# Patient Record
Sex: Male | Born: 1960 | Race: White | Hispanic: No | Marital: Married | Smoking: Former smoker
Health system: Southern US, Community
[De-identification: ages and names within clinical notes are randomized; demographics above are authoritative.]

## PROBLEM LIST (undated history)

## (undated) DIAGNOSIS — Z9289 Personal history of other medical treatment: Secondary | ICD-10-CM

## (undated) DIAGNOSIS — K219 Gastro-esophageal reflux disease without esophagitis: Secondary | ICD-10-CM

## (undated) DIAGNOSIS — R112 Nausea with vomiting, unspecified: Secondary | ICD-10-CM

## (undated) DIAGNOSIS — I712 Thoracic aortic aneurysm, without rupture, unspecified: Secondary | ICD-10-CM

## (undated) DIAGNOSIS — Z860101 Personal history of adenomatous and serrated colon polyps: Secondary | ICD-10-CM

## (undated) DIAGNOSIS — T4145XA Adverse effect of unspecified anesthetic, initial encounter: Secondary | ICD-10-CM

## (undated) DIAGNOSIS — M170 Bilateral primary osteoarthritis of knee: Secondary | ICD-10-CM

## (undated) DIAGNOSIS — Z8679 Personal history of other diseases of the circulatory system: Secondary | ICD-10-CM

## (undated) DIAGNOSIS — Z86010 Personal history of colonic polyps: Secondary | ICD-10-CM

## (undated) DIAGNOSIS — I350 Nonrheumatic aortic (valve) stenosis: Secondary | ICD-10-CM

## (undated) DIAGNOSIS — R011 Cardiac murmur, unspecified: Secondary | ICD-10-CM

## (undated) DIAGNOSIS — I10 Essential (primary) hypertension: Secondary | ICD-10-CM

## (undated) DIAGNOSIS — J301 Allergic rhinitis due to pollen: Secondary | ICD-10-CM

## (undated) DIAGNOSIS — T8859XA Other complications of anesthesia, initial encounter: Secondary | ICD-10-CM

## (undated) DIAGNOSIS — E7800 Pure hypercholesterolemia, unspecified: Secondary | ICD-10-CM

## (undated) DIAGNOSIS — K269 Duodenal ulcer, unspecified as acute or chronic, without hemorrhage or perforation: Secondary | ICD-10-CM

## (undated) DIAGNOSIS — B029 Zoster without complications: Secondary | ICD-10-CM

## (undated) DIAGNOSIS — Z9889 Other specified postprocedural states: Secondary | ICD-10-CM

## (undated) DIAGNOSIS — R7301 Impaired fasting glucose: Secondary | ICD-10-CM

## (undated) HISTORY — DX: Personal history of adenomatous and serrated colon polyps: Z86.0101

## (undated) HISTORY — DX: Bilateral primary osteoarthritis of knee: M17.0

## (undated) HISTORY — PX: WRIST SURGERY: SHX841

## (undated) HISTORY — DX: Pure hypercholesterolemia, unspecified: E78.00

## (undated) HISTORY — DX: Zoster without complications: B02.9

## (undated) HISTORY — PX: HEEL SPUR SURGERY: SHX665

## (undated) HISTORY — DX: Impaired fasting glucose: R73.01

## (undated) HISTORY — DX: Nonrheumatic aortic (valve) stenosis: I35.0

## (undated) HISTORY — PX: COLONOSCOPY: SHX174

## (undated) HISTORY — DX: Essential (primary) hypertension: I10

## (undated) HISTORY — PX: KNEE ARTHROSCOPY: SUR90

## (undated) HISTORY — DX: Personal history of colonic polyps: Z86.010

## (undated) HISTORY — DX: Thoracic aortic aneurysm, without rupture, unspecified: I71.20

## (undated) HISTORY — DX: Allergic rhinitis due to pollen: J30.1

## (undated) HISTORY — DX: Personal history of other diseases of the circulatory system: Z86.79

---

## 1981-07-05 DIAGNOSIS — K269 Duodenal ulcer, unspecified as acute or chronic, without hemorrhage or perforation: Secondary | ICD-10-CM

## 1981-07-05 HISTORY — DX: Duodenal ulcer, unspecified as acute or chronic, without hemorrhage or perforation: K26.9

## 2000-09-07 ENCOUNTER — Encounter: Admission: RE | Admit: 2000-09-07 | Discharge: 2000-09-07 | Payer: Self-pay | Admitting: Internal Medicine

## 2000-12-23 ENCOUNTER — Ambulatory Visit (HOSPITAL_BASED_OUTPATIENT_CLINIC_OR_DEPARTMENT_OTHER): Admission: RE | Admit: 2000-12-23 | Discharge: 2000-12-23 | Payer: Self-pay | Admitting: Orthopedic Surgery

## 2000-12-23 ENCOUNTER — Encounter (INDEPENDENT_AMBULATORY_CARE_PROVIDER_SITE_OTHER): Payer: Self-pay | Admitting: *Deleted

## 2004-07-05 HISTORY — PX: LASIK: SHX215

## 2004-07-05 HISTORY — PX: GANGLION CYST EXCISION: SHX1691

## 2004-07-28 ENCOUNTER — Ambulatory Visit: Payer: Self-pay | Admitting: Family Medicine

## 2007-08-07 ENCOUNTER — Encounter: Admission: RE | Admit: 2007-08-07 | Discharge: 2007-08-07 | Payer: Self-pay | Admitting: Neurosurgery
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
## 2011-07-06 HISTORY — PX: HEEL SPUR SURGERY: SHX665

## 2014-01-10 ENCOUNTER — Telehealth: Payer: Self-pay | Admitting: *Deleted

## 2014-01-10 NOTE — Telephone Encounter (Signed)
[facility] [place] per [employer] Webster

## 2014-04-19 ENCOUNTER — Other Ambulatory Visit (INDEPENDENT_AMBULATORY_CARE_PROVIDER_SITE_OTHER): Payer: Self-pay | Admitting: Surgery

## 2014-04-23 ENCOUNTER — Encounter (HOSPITAL_COMMUNITY): Payer: Self-pay | Admitting: Pharmacy Technician

## 2014-04-23 NOTE — Pre-Procedure Instructions (Signed)
Chase Lowe  04/23/2014   Your procedure is scheduled on:  Friday, Chase BlackbirdOctober 23  [facility] at 0530 AM.  Call this number if you have problems the morning of surgery: [phone]   Remember:   Do not eat food or drink liquids after midnight.Thursday night   Take these medicines the morning of surgery with A SIP OF WATER: none   Do not wear jewelry.  Do not wear lotions, powders, or perfumes. .  Do not shave 48 hours prior to surgery. Men may shave face and neck.  Do not bring valuables to the hospital.  [facility] is not responsible  for any belongings or valuables.               Contacts, dentures or bridgework may not be worn into surgery.  Leave suitcase in the car. After surgery it may be brought to your room.  For patients admitted to the hospital, discharge time is determined by your.treatment team.               Patients discharged the day of surgery will not be allowed to drive  home.  Name and phone number of your driver:      Special Instructions: [place] - Preparing for Surgery  Before surgery, you can play an important role.  Because skin is not sterile, your skin needs to be as free of germs as possible.  You can reduce the number of germs on you skin by washing with CHG (chlorahexidine gluconate) soap before surgery.  CHG is an antiseptic cleaner which kills germs and bonds with the skin to continue killing germs even after washing.  Please DO NOT use if you have an allergy to CHG or antibacterial soaps.  If your skin becomes reddened/irritated stop using the CHG and inform your nurse when you arrive at Short Stay.  Do not shave (including legs and underarms) for at least 48 hours prior to the first CHG shower.  You may shave your face.  Please follow these instructions carefully:   1.  Shower with CHG Soap the night before surgery and the  morning of Surgery.  2.  If you choose to wash your hair, wash your hair first as  usual with your normal shampoo.  3.  After you shampoo, rinse your hair and body thoroughly to remove the  Shampoo.  4.  Use CHG as you would any other liquid soap.  You can apply chg directly   to the skin and wash gently with scrungie or a clean washcloth.  5.  Apply the CHG Soap to your body ONLY FROM THE NECK DOWN.  Do not use on open wounds or open sores.  Avoid contact with your eyes,   ears, mouth and genitals (private parts).  Wash genitals (private parts)  with your normal soap.  6.  Wash thoroughly, paying special attention to the area where your surgery will be performed.  7.  Thoroughly rinse your body with warm water from the neck down.  8.  DO NOT shower/wash with your normal soap after using and rinsing off   the CHG Soap.  9.  Pat yourself dry with a clean towel.            10.  Wear clean pajamas.            11.  Place clean sheets on your bed the night of your first shower and do not   sleep  with pets.  Day of Surgery  Do not apply any lotions/deoderants the morning of surgery.  Please wear clean clothes to the hospital/surgery center.     Please read over the following fact sheets that you were given: Pain Booklet, Coughing and Deep Breathing and Surgical Site Infection Prevention

## 2014-04-24 ENCOUNTER — Encounter (HOSPITAL_COMMUNITY): Payer: Self-pay

## 2014-04-24 ENCOUNTER — Encounter (HOSPITAL_COMMUNITY)
Admission: RE | Admit: 2014-04-24 | Discharge: 2014-04-24 | Disposition: A | Discharge status: Home / Self Care | Payer: BC Managed Care – PPO | Source: Ambulatory Visit | Attending: Surgery | Admitting: Surgery

## 2014-04-24 DIAGNOSIS — K4090 Unilateral inguinal hernia, without obstruction or gangrene, not specified as recurrent: Secondary | ICD-10-CM | POA: Diagnosis not present

## 2014-04-24 DIAGNOSIS — Z87891 Personal history of nicotine dependence: Secondary | ICD-10-CM | POA: Diagnosis not present

## 2014-04-24 HISTORY — DX: Other complications of anesthesia, initial encounter: T88.59XA

## 2014-04-24 HISTORY — DX: Adverse effect of unspecified anesthetic, initial encounter: T41.45XA

## 2014-04-24 LAB — CBC
HCT: 40.7 % (ref 39.0–52.0)
HEMOGLOBIN: 14 g/dL (ref 13.0–17.0)
MCH: 31.7 pg (ref 26.0–34.0)
MCHC: 34.4 g/dL (ref 30.0–36.0)
MCV: 92.1 fL (ref 78.0–100.0)
Platelets: 153 10*3/uL (ref 150–400)
RBC: 4.42 MIL/uL (ref 4.22–5.81)
RDW: 12.3 % (ref 11.5–15.5)
WBC: 3.2 10*3/uL — AB (ref 4.0–10.5)

## 2014-04-24 NOTE — Progress Notes (Signed)
PCP: Dr. Lenise [facility]eyers at [facility] [place]  Pt. Stated he had ekg in 2013 because he felt a flutter in his chest. Went to Dr. Lenise ArenaMeyers' and had ekg. Stated everything was normal.No other test were done. No problems since then. Pt. Stated he had a physical last week and Dr. Lenise ArenaMeyers is aware of pt's surgery.

## 2014-04-25 MED ORDER — CEFAZOLIN SODIUM-DEXTROSE 2-3 GM-% IV SOLR
2.0000 g | INTRAVENOUS | Status: DC
  Filled 2014-04-25: qty 50

## 2014-04-25 NOTE — H&P (Signed)
Chase Lowe 04/19/2014 9:54 AM Location: [facility] Patient #: [id number] DOB: 1960/09/10 Married / Language: Undefined / Race: Undefined Male  History of Present Illness (Lakota Schweppe A. Magnus IvanBlackman MD; 04/19/2014 10:10 AM) Patient words: inguinal hernia.  The patient is a 53 year old male who presents with an inguinal hernia. This gentleman is well-known to me. He is a self referral with a symptomatic left inguinal hernia. he has had a hernia several months. He reports that it easily reduces but is slightly getting larger and more uncomfortable. He has no obstructive symptoms   Other Problems Ginnie Smart(Michelle R Brooks, CMA; 04/19/2014 9:54 AM) Gastric Ulcer Inguinal Hernia  Past Surgical History Ginnie Smart(Michelle R Brooks, CMA; 04/19/2014 9:54 AM) Foot Surgery Right. Vasectomy  Diagnostic Studies History Ginnie Smart(Michelle R [place], [place]; 04/19/2014 9:54 AM) Colonoscopy >10 years ago  Allergies Ginnie Smart(Michelle R Brooks, [place]; 04/19/2014 9:57 AM) Codeine Phosphate *ANALGESICS - OPIOID*  Medication History Ginnie Smart(Michelle R Brooks, CMA; 04/19/2014 9:56 AM) Multiple Vitamin (1 (one) Oral) Active. Aspirin (81MG  Tablet, 1 (one) Oral) Active.  Social History Ginnie Smart(Michelle R Brooks, [place]; 04/19/2014 9:54 AM) Alcohol use Occasional alcohol use. Caffeine use Carbonated beverages, Coffee. No drug use Tobacco use Former smoker.  Family History Ginnie Smart(Michelle R Brooks, [place]; 04/19/2014 9:54 AM) First Degree Relatives No pertinent family history  Review of Systems Marcelino Duster(Michelle R. Brooks CMA; 04/19/2014 9:54 AM) General Not Present- Appetite Loss, Chills, Fatigue, Fever, Night Sweats, Weight Gain and Weight Loss. Skin Not Present- Change in Wart/Mole, Dryness, Hives, Jaundice, New Lesions, Non-Healing Wounds, Rash and Ulcer. HEENT Not Present- Earache, Hearing Loss, Hoarseness, Nose Bleed, Oral Ulcers, Ringing in the Ears, Seasonal Allergies, Sinus Pain, Sore Throat, Visual Disturbances, Wears glasses/contact  lenses and Yellow Eyes. Respiratory Not Present- Bloody sputum, Chronic Cough, Difficulty Breathing, Snoring and Wheezing. Breast Not Present- Breast Mass, Breast Pain, Nipple Discharge and Skin Changes. Cardiovascular Not Present- Chest Pain, Difficulty Breathing Lying Down, Leg Cramps, Palpitations, Rapid Heart Rate, Shortness of Breath and Swelling of Extremities. Gastrointestinal Not Present- Abdominal Pain, Bloating, Bloody Stool, Change in Bowel Habits, Chronic diarrhea, Constipation, Difficulty Swallowing, Excessive gas, Gets full quickly at meals, Hemorrhoids, Indigestion, Nausea, Rectal Pain and Vomiting. Male Genitourinary Not Present- Blood in Urine, Change in Urinary Stream, Frequency, Impotence, Nocturia, Painful Urination, Urgency and Urine Leakage. Musculoskeletal Not Present- Back Pain, Joint Pain, Joint Stiffness, Muscle Pain, Muscle Weakness and Swelling of Extremities. Neurological Not Present- Decreased Memory, Fainting, Headaches, Numbness, Seizures, Tingling, Tremor, Trouble walking and Weakness. Psychiatric Not Present- Anxiety, Bipolar, Change in Sleep Pattern, Depression, Fearful and Frequent crying. Endocrine Not Present- Cold Intolerance, Excessive Hunger, Hair Changes, Heat Intolerance, Hot flashes and New Diabetes. Hematology Not Present- Easy Bruising, Excessive bleeding, Gland problems, HIV and Persistent Infections.   Vitals [employer](Michelle R. Brooks CMA; 04/19/2014 9:55 AM) 04/19/2014 9:54 AM Weight: 184.38 lb Height: 70in Body Surface Area: 2.03 m Body Mass Index: 26.45 kg/m Pulse: 72 (Regular)  Resp.: 14 (Unlabored)  BP: 118/80 (Sitting, Left Arm, Standard)    Physical Exam (Ayvin Lipinski A. Magnus IvanBlackman MD; 04/19/2014 10:10 AM) The physical exam findings are as follows: Note:He is well-appearing lungs are clear to auscultation bilaterally cardiovascular is regular rate and rhythm there is an easily reducible left inguinal hernia. there is no evidence of  right inguinal hernia or umbilical hernia    Assessment & Plan (Linus Weckerly A. Magnus IvanBlackman MD; 04/19/2014 10:11 AM) LEFT INGUINAL HERNIA (550.90  K40.90) Impression: I discussed the diagnosis with him in detail. I discussed surgical repair with mesh. I discussed both laparoscopic  and open techniques. He wished to proceed with a left open inguinal hernia repair with mesh. I discussed risks of surgery in detail. He understands and wished to proceed

## 2014-04-26 ENCOUNTER — Encounter (HOSPITAL_COMMUNITY): Payer: BC Managed Care – PPO | Admitting: Anesthesiology

## 2014-04-26 ENCOUNTER — Encounter (HOSPITAL_COMMUNITY)
Admission: RE | Disposition: A | Discharge status: Home / Self Care | Payer: Self-pay | Source: Ambulatory Visit | Attending: Surgery

## 2014-04-26 ENCOUNTER — Ambulatory Visit (HOSPITAL_COMMUNITY)
Admission: RE | Admit: 2014-04-26 | Discharge: 2014-04-26 | Disposition: A | Discharge status: Home / Self Care | Payer: BC Managed Care – PPO | Source: Ambulatory Visit | Attending: Surgery | Admitting: Surgery

## 2014-04-26 ENCOUNTER — Encounter (HOSPITAL_COMMUNITY): Payer: Self-pay | Admitting: *Deleted

## 2014-04-26 ENCOUNTER — Ambulatory Visit (HOSPITAL_COMMUNITY): Payer: BC Managed Care – PPO | Admitting: Anesthesiology

## 2014-04-26 DIAGNOSIS — K4090 Unilateral inguinal hernia, without obstruction or gangrene, not specified as recurrent: Secondary | ICD-10-CM | POA: Diagnosis not present

## 2014-04-26 DIAGNOSIS — Z87891 Personal history of nicotine dependence: Secondary | ICD-10-CM | POA: Insufficient documentation

## 2014-04-26 HISTORY — PX: INGUINAL HERNIA REPAIR: SHX194

## 2014-04-26 HISTORY — PX: INSERTION OF MESH: SHX5868

## 2014-04-26 SURGERY — REPAIR, HERNIA, INGUINAL, ADULT
Anesthesia: Regional | Site: Groin | Laterality: Left

## 2014-04-26 MED ORDER — MIDAZOLAM HCL 2 MG/2ML IJ SOLN
INTRAMUSCULAR | Status: AC
  Filled 2014-04-26: qty 2

## 2014-04-26 MED ORDER — MIDAZOLAM HCL 5 MG/5ML IJ SOLN
INTRAMUSCULAR | Status: DC | PRN
  Administered 2014-04-26: 2 mg via INTRAVENOUS

## 2014-04-26 MED ORDER — OXYCODONE HCL 5 MG PO TABS: 5.0000 mg | ORAL_TABLET | Freq: Once | ORAL | Status: DC | PRN

## 2014-04-26 MED ORDER — KETOROLAC TROMETHAMINE 30 MG/ML IJ SOLN
INTRAMUSCULAR | Status: DC | PRN
  Administered 2014-04-26: 30 mg via INTRAVENOUS

## 2014-04-26 MED ORDER — PROPOFOL 10 MG/ML IV BOLUS
INTRAVENOUS | Status: DC | PRN
  Administered 2014-04-26: 200 mg via INTRAVENOUS

## 2014-04-26 MED ORDER — LIDOCAINE HCL (PF) 1 % IJ SOLN
INTRAMUSCULAR | Status: AC
Start: 2014-04-26 — End: 2014-04-26
  Filled 2014-04-26: qty 30

## 2014-04-26 MED ORDER — MORPHINE SULFATE 2 MG/ML IJ SOLN: 1.0000 mg | INTRAMUSCULAR | Status: DC | PRN

## 2014-04-26 MED ORDER — OXYCODONE-ACETAMINOPHEN 5-325 MG PO TABS
1.0000 | ORAL_TABLET | Freq: Once | ORAL | Status: AC
  Administered 2014-04-26: 1 via ORAL

## 2014-04-26 MED ORDER — HYDROMORPHONE HCL 1 MG/ML IJ SOLN
INTRAMUSCULAR | Status: AC
  Filled 2014-04-26: qty 1

## 2014-04-26 MED ORDER — ONDANSETRON HCL 4 MG/2ML IJ SOLN
INTRAMUSCULAR | Status: AC
  Filled 2014-04-26: qty 2

## 2014-04-26 MED ORDER — OXYCODONE-ACETAMINOPHEN 5-325 MG PO TABS: 1.0000 | ORAL_TABLET | ORAL | Status: DC | PRN

## 2014-04-26 MED ORDER — LACTATED RINGERS IV SOLN
INTRAVENOUS | Status: DC | PRN
  Administered 2014-04-26: 07:00:00 via INTRAVENOUS

## 2014-04-26 MED ORDER — OXYCODONE HCL 5 MG/5ML PO SOLN: 5.0000 mg | Freq: Once | ORAL | Status: DC | PRN

## 2014-04-26 MED ORDER — LIDOCAINE HCL (PF) 1 % IJ SOLN
INTRAMUSCULAR | Status: DC | PRN
  Administered 2014-04-26: 5 mL

## 2014-04-26 MED ORDER — KETOROLAC TROMETHAMINE 30 MG/ML IJ SOLN
INTRAMUSCULAR | Status: AC
  Filled 2014-04-26: qty 1

## 2014-04-26 MED ORDER — NEOSTIGMINE METHYLSULFATE 10 MG/10ML IV SOLN
INTRAVENOUS | Status: AC
  Filled 2014-04-26: qty 1

## 2014-04-26 MED ORDER — BUPIVACAINE-EPINEPHRINE (PF) 0.5% -1:200000 IJ SOLN
INTRAMUSCULAR | Status: AC
  Filled 2014-04-26: qty 30

## 2014-04-26 MED ORDER — FENTANYL CITRATE 0.05 MG/ML IJ SOLN
INTRAMUSCULAR | Status: DC | PRN
  Administered 2014-04-26: 100 ug via INTRAVENOUS
  Administered 2014-04-26: 50 ug via INTRAVENOUS

## 2014-04-26 MED ORDER — BUPIVACAINE-EPINEPHRINE (PF) 0.5% -1:200000 IJ SOLN
INTRAMUSCULAR | Status: DC | PRN
  Administered 2014-04-26: 30 mL via PERINEURAL

## 2014-04-26 MED ORDER — OXYCODONE HCL 5 MG PO TABS: 5.0000 mg | ORAL_TABLET | ORAL | Status: DC | PRN

## 2014-04-26 MED ORDER — FENTANYL CITRATE 0.05 MG/ML IJ SOLN
INTRAMUSCULAR | Status: AC
  Filled 2014-04-26: qty 5

## 2014-04-26 MED ORDER — SUCCINYLCHOLINE CHLORIDE 20 MG/ML IJ SOLN
INTRAMUSCULAR | Status: AC
  Filled 2014-04-26: qty 1

## 2014-04-26 MED ORDER — PROPOFOL 10 MG/ML IV BOLUS
INTRAVENOUS | Status: AC
  Filled 2014-04-26: qty 20

## 2014-04-26 MED ORDER — SODIUM CHLORIDE 0.9 % IJ SOLN
INTRAMUSCULAR | Status: AC
  Filled 2014-04-26: qty 10

## 2014-04-26 MED ORDER — ONDANSETRON HCL 4 MG/2ML IJ SOLN: 4.0000 mg | Freq: Four times a day (QID) | INTRAMUSCULAR | Status: DC | PRN

## 2014-04-26 MED ORDER — ONDANSETRON HCL 4 MG/2ML IJ SOLN
INTRAMUSCULAR | Status: DC | PRN
  Administered 2014-04-26: 4 mg via INTRAVENOUS

## 2014-04-26 MED ORDER — LIDOCAINE HCL (CARDIAC) 20 MG/ML IV SOLN
INTRAVENOUS | Status: DC | PRN
  Administered 2014-04-26: 70 mg via INTRAVENOUS

## 2014-04-26 MED ORDER — LIDOCAINE HCL (CARDIAC) 20 MG/ML IV SOLN
INTRAVENOUS | Status: AC
  Filled 2014-04-26: qty 5

## 2014-04-26 MED ORDER — EPHEDRINE SULFATE 50 MG/ML IJ SOLN
INTRAMUSCULAR | Status: AC
  Filled 2014-04-26: qty 1

## 2014-04-26 MED ORDER — LIDOCAINE HCL (CARDIAC) 20 MG/ML IV SOLN
INTRAVENOUS | Status: AC
Start: 2014-04-26 — End: 2014-04-26
  Filled 2014-04-26: qty 5

## 2014-04-26 MED ORDER — ROCURONIUM BROMIDE 50 MG/5ML IV SOLN
INTRAVENOUS | Status: AC
  Filled 2014-04-26: qty 1

## 2014-04-26 MED ORDER — HYDROMORPHONE HCL 1 MG/ML IJ SOLN
0.2500 mg | INTRAMUSCULAR | Status: DC | PRN
  Administered 2014-04-26 (×2): 0.5 mg via INTRAVENOUS

## 2014-04-26 MED ORDER — OXYCODONE-ACETAMINOPHEN 5-325 MG PO TABS
ORAL_TABLET | ORAL | Status: AC
  Filled 2014-04-26: qty 1

## 2014-04-26 MED ORDER — GLYCOPYRROLATE 0.2 MG/ML IJ SOLN
INTRAMUSCULAR | Status: AC
  Filled 2014-04-26: qty 2

## 2014-04-26 MED ORDER — 0.9 % SODIUM CHLORIDE (POUR BTL) OPTIME
TOPICAL | Status: DC | PRN
  Administered 2014-04-26: 1000 mL

## 2014-04-26 SURGICAL SUPPLY — 53 items
APL SKNCLS STERI-STRIP NONHPOA (GAUZE/BANDAGES/DRESSINGS) ×1
BENZOIN TINCTURE PRP APPL 2/3 (GAUZE/BANDAGES/DRESSINGS) ×3 IMPLANT
BLADE SURG 10 STRL SS (BLADE) ×3 IMPLANT
BLADE SURG 15 STRL LF DISP TIS (BLADE) ×1 IMPLANT
BLADE SURG 15 STRL SS (BLADE) ×3
BLADE SURG ROTATE 9660 (MISCELLANEOUS) IMPLANT
CHLORAPREP W/TINT 26ML (MISCELLANEOUS) ×3 IMPLANT
CLOSURE WOUND 1/2 X4 (GAUZE/BANDAGES/DRESSINGS) ×1
COVER SURGICAL LIGHT HANDLE (MISCELLANEOUS) ×3 IMPLANT
DRAIN PENROSE 1/2X12 LTX STRL (WOUND CARE) ×2 IMPLANT
DRAPE LAPAROTOMY TRNSV 102X78 (DRAPE) ×3 IMPLANT
DRAPE UTILITY 15X26 W/TAPE STR (DRAPE) ×6 IMPLANT
DRSG TEGADERM 4X4.75 (GAUZE/BANDAGES/DRESSINGS) ×3 IMPLANT
DRSG TELFA 3X8 NADH (GAUZE/BANDAGES/DRESSINGS) ×3 IMPLANT
ELECT CAUTERY BLADE 6.4 (BLADE) ×3 IMPLANT
ELECT REM PT RETURN 9FT ADLT (ELECTROSURGICAL) ×3
ELECTRODE REM PT RTRN 9FT ADLT (ELECTROSURGICAL) ×1 IMPLANT
GLOVE BIO SURGEON STRL SZ 6.5 (GLOVE) ×1 IMPLANT
GLOVE BIO SURGEON STRL SZ7 (GLOVE) ×2 IMPLANT
GLOVE BIO SURGEONS STRL SZ 6.5 (GLOVE) ×1
GLOVE BIOGEL PI IND STRL 7.0 (GLOVE) IMPLANT
GLOVE BIOGEL PI INDICATOR 7.0 (GLOVE) ×6
GLOVE SURG SIGNA 7.5 PF LTX (GLOVE) ×3 IMPLANT
GOWN STRL REUS W/ TWL LRG LVL3 (GOWN DISPOSABLE) ×1 IMPLANT
GOWN STRL REUS W/ TWL XL LVL3 (GOWN DISPOSABLE) ×1 IMPLANT
GOWN STRL REUS W/TWL LRG LVL3 (GOWN DISPOSABLE) ×6
GOWN STRL REUS W/TWL XL LVL3 (GOWN DISPOSABLE) ×3
KIT BASIN OR (CUSTOM PROCEDURE TRAY) ×3 IMPLANT
KIT ROOM TURNOVER OR (KITS) ×3 IMPLANT
MESH PARIETEX PROGRIP LEFT (Mesh General) ×2 IMPLANT
NDL HYPO 25GX1X1/2 BEV (NEEDLE) ×1 IMPLANT
NEEDLE HYPO 25GX1X1/2 BEV (NEEDLE) ×3 IMPLANT
NS IRRIG 1000ML POUR BTL (IV SOLUTION) ×3 IMPLANT
PACK SURGICAL SETUP 50X90 (CUSTOM PROCEDURE TRAY) ×3 IMPLANT
PAD ARMBOARD 7.5X6 YLW CONV (MISCELLANEOUS) ×3 IMPLANT
PAD DRESSING TELFA 3X8 NADH (GAUZE/BANDAGES/DRESSINGS) IMPLANT
PENCIL BUTTON HOLSTER BLD 10FT (ELECTRODE) ×3 IMPLANT
SPECIMEN JAR SMALL (MISCELLANEOUS) IMPLANT
SPONGE LAP 18X18 X RAY DECT (DISPOSABLE) ×3 IMPLANT
STRIP CLOSURE SKIN 1/2X4 (GAUZE/BANDAGES/DRESSINGS) ×2 IMPLANT
SUT MON AB 4-0 PC3 18 (SUTURE) ×3 IMPLANT
SUT SILK 2 0 SH (SUTURE) ×4 IMPLANT
SUT SILK 3 0 SH 30 (SUTURE) ×2 IMPLANT
SUT VIC AB 2-0 CT1 27 (SUTURE) ×3
SUT VIC AB 2-0 CT1 TAPERPNT 27 (SUTURE) ×1 IMPLANT
SUT VIC AB 3-0 CT1 27 (SUTURE) ×3
SUT VIC AB 3-0 CT1 TAPERPNT 27 (SUTURE) ×1 IMPLANT
SYR CONTROL 10ML LL (SYRINGE) ×3 IMPLANT
TOWEL OR 17X24 6PK STRL BLUE (TOWEL DISPOSABLE) ×1 IMPLANT
TOWEL OR 17X26 10 PK STRL BLUE (TOWEL DISPOSABLE) ×3 IMPLANT
TUBE CONNECTING 12'X1/4 (SUCTIONS) ×1
TUBE CONNECTING 12X1/4 (SUCTIONS) ×1 IMPLANT
YANKAUER SUCT BULB TIP NO VENT (SUCTIONS) ×2 IMPLANT

## 2014-04-26 NOTE — Anesthesia Procedure Notes (Addendum)
Anesthesia Regional Block:  TAP block  Pre-Anesthetic Checklist: ,, timeout performed, Correct Patient, Correct Site, Correct Laterality, Correct Procedure, Correct Position, site marked, Risks and benefits discussed,  Surgical consent,  Pre-op evaluation,  At surgeon's request and post-op pain management  Laterality: Left  Prep: chloraprep       Needles:   Needle Type: Echogenic Needle     Needle Length: 9cm 9 cm Needle Gauge: 21 and 21 G    Additional Needles:  Procedures: ultrasound guided (picture in chart) TAP block Narrative:  Start time: 04/26/2014 7:02 AM End time: 04/26/2014 7:14 AM Injection made incrementally with aspirations every 5 mL.  Performed by: Personally  Anesthesiologist: Dr Chaney MallingHodierne  Additional Notes: Pt tolerated the procedure well.   Procedure Name: LMA Insertion Date/Time: 04/26/2014 7:35 AM Performed by: Coralee RudFLORES, Jemery Stacey Pre-anesthesia Checklist: Patient identified, Emergency Drugs available, Suction available, Patient being monitored and Timeout performed Patient Re-evaluated:Patient Re-evaluated prior to inductionOxygen Delivery Method: Circle system utilized Preoxygenation: Pre-oxygenation with 100% oxygen Intubation Type: IV induction Ventilation: Mask ventilation without difficulty LMA: LMA inserted LMA Size: 5.0 Number of attempts: 1 Placement Confirmation: positive ETCO2 and breath sounds checked- equal and bilateral Tube secured with: Tape Dental Injury: Teeth and Oropharynx as per pre-operative assessment

## 2014-04-26 NOTE — Op Note (Signed)
NAMTito Dine:  Lowe, Chase Lowe              ACCOUNT NO.:  [id number]  MEDICAL RECORD NO.:  [id number]  LOCATION:  [facility]                         FACILITY:  [facility]  PHYSICIAN:  Abigail Miyamotoouglas Shanta Hartner, M.D. DATE OF BIRTH:  Feb 14, 1961  DATE OF PROCEDURE:  04/26/2014 DATE OF DISCHARGE:                              OPERATIVE REPORT   PREOPERATIVE DIAGNOSIS:  Left inguinal hernia.  POSTOPERATIVE DIAGNOSIS:  Left inguinal hernia.  PROCEDURE:  Left inguinal hernia repair with mesh.  SURGEON:  Dr. Abigail Miyamotoouglas Arshi Duarte.  ANESTHESIA:  General and half block with 1% lidocaine as well.  ESTIMATED BLOOD LOSS:  Minimal.  PROCEDURE IN DETAIL:  The patient was brought to the operating room, identified as [employer] Lowe.  He was placed supine on the operating room table and general anesthesia was induced.  He had already received a TAP block by Anesthesiology.  His abdomen was prepped and draped in the usual sterile fashion.  I anesthetized the skin and left groin with 1% lidocaine.  I then made a longitudinal incision with a scalpel.  I took this down through Scarpa's fascia with the electrocautery.  The external oblique fascia was then identified and opened with the internal and external rings.  I got some bleeding in transversalis muscles which had to be controlled with a couple of silk sutures.  I then was able to control the testicular cord structures with a Penrose drain.  The patient had a moderate-sized lipoma of the testicular cord as well as an indirect hernia sac.  I excised lipoma from the cord structure and tied off the base of that with a 2-0 silk suture.  I dissected the indirect hernia sac out to its base.  I reduced the contents and tied off the base with a silk suture as well.  I then excised the sac completely. Next, a piece of Prolene Covidien ProGrip mesh was brought onto the field.  I placed it around the cord structures and secured along the pubic tubercle and over the internal ring  appropriately.  I then sutured in place the pubic tubercle with a single 2-0 Vicryl suture.  Excellent coverage of the inguinal floor and cord structure appeared to be achieved.  I then closed the external oblique fascia over the top of this with running 2-0 Vicryl suture.  I closed the Scarpa fascia with interrupted 3-0 Vicryl sutures and closed the skin with a running 4-0 Monocryl.  Steri-Strips, Telfa, and Tegaderm were then applied.  The patient tolerated the procedure well.  All the counts were correct at the end of procedure.  The patient was then extubated in the operating room and taken in stable condition to recovery room.    Abigail Miyamotoouglas Sharis Keeran, M.D.    DB/MEDQ  D:  04/26/2014  T:  04/26/2014  Job:  [id number]

## 2014-04-26 NOTE — Discharge Instructions (Signed)
[facility] _______[facility]  UMBILICAL OR INGUINAL HERNIA REPAIR: POST OP INSTRUCTIONS  Always review your discharge instruction sheet given to you by the facility where your surgery was performed. IF YOU HAVE DISABILITY OR FAMILY LEAVE FORMS, YOU MUST BRING THEM TO THE OFFICE FOR PROCESSING.   DO NOT GIVE THEM TO YOUR DOCTOR.  1. A  prescription for pain medication may be given to you upon discharge.  Take your pain medication as prescribed, if needed.  If narcotic pain medicine is not needed, then you may take acetaminophen (Tylenol) or ibuprofen (Advil) as needed. 2. Take your usually prescribed medications unless otherwise directed. 3. If you need a refill on your pain medication, please contact your pharmacy.  They will contact our office to request authorization. Prescriptions will not be filled after 5 pm or on week-ends. 4. You should follow a light diet the first 24 hours after arrival home, such as soup and crackers, etc.  Be sure to include lots of fluids daily.  Resume your normal diet the day after surgery. 5. Most patients will experience some swelling and bruising around the umbilicus or in the groin and scrotum.  Ice packs and reclining will help.  Swelling and bruising can take several days to resolve.  6. It is common to experience some constipation if taking pain medication after surgery.  Increasing fluid intake and taking a stool softener (such as Colace) will usually help or prevent this problem from occurring.  A mild laxative (Milk of Magnesia or Miralax) should be taken according to package directions if there are no bowel movements after 48 hours. 7. Unless discharge instructions indicate otherwise, you may remove your bandages 24-48 hours after surgery, and you may shower at that time.  You may have steri-strips (small skin tapes) in place directly over the incision.  These strips should be left on the skin for 7-10 days.  If your surgeon used skin glue on the  incision, you may shower in 24 hours.  The glue will flake off over the next 2-3 weeks.  Any sutures or staples will be removed at the office during your follow-up visit. 8. ACTIVITIES:  You may resume regular (light) daily activities beginning the next day--such as daily self-care, walking, climbing stairs--gradually increasing activities as tolerated.  You may have sexual intercourse when it is comfortable.  Refrain from any heavy lifting or straining until approved by your doctor. a. You may drive when you are no longer taking prescription pain medication, you can comfortably wear a seatbelt, and you can safely maneuver your car and apply brakes. b. RETURN TO WORK:  __________________________________________________________ 9. You should see your doctor in the office for a follow-up appointment approximately 2-3 weeks after your surgery.  Make sure that you call for this appointment within a day or two after you arrive home to insure a convenient appointment time. 10. OTHER INSTRUCTIONS: NO LIFTING MORE THAN 15 TO 20 POUNDS FOR 4 WEEKS 11. ICE PACK AS NEEDED __________________________________________________________________________________________________________________________________________________________________________________________  WHEN TO CALL YOUR DOCTOR: 1. Fever over 101.0 2. Inability to urinate 3. Nausea and/or vomiting 4. Extreme swelling or bruising 5. Continued bleeding from incision. 6. Increased pain, redness, or drainage from the incision  The clinic staff is available to answer your questions during regular business hours.  Please dont hesitate to call and ask to speak to one of the nurses for clinical concerns.  If you have a medical emergency, go to the nearest emergency room or call 911.  A surgeon from [facility] is always on call at the hospital   [address], [address], [place], [place]  [id number] ?  [address] [address], [place], [place]    [id number] [phone] ? [phone] ? FAX [phone] Web site: [email or web address]  What to eat:  For your first meals, you should eat lightly; only small meals initially.  If you do not have nausea, you may eat larger meals.  Avoid spicy, greasy and heavy food.    General Anesthesia, Adult, Care After  Refer to this sheet in the next few weeks. These instructions provide you with information on caring for yourself after your procedure. Your health care provider may also give you more specific instructions. Your treatment has been planned according to current medical practices, but problems sometimes occur. Call your health care provider if you have any problems or questions after your procedure.  WHAT TO EXPECT AFTER THE PROCEDURE  After the procedure, it is typical to experience:  Sleepiness.  Nausea and vomiting. HOME CARE INSTRUCTIONS  For the first 24 hours after general anesthesia:  Have a responsible person with you.  Do not drive a car. If you are alone, do not take public transportation.  Do not drink alcohol.  Do not take medicine that has not been prescribed by your health care provider.  Do not sign important papers or make important decisions.  You may resume a normal diet and activities as directed by your health care provider.  Change bandages (dressings) as directed.  If you have questions or problems that seem related to general anesthesia, call the hospital and ask for the anesthetist or anesthesiologist on call. SEEK MEDICAL CARE IF:  You have nausea and vomiting that continue the day after anesthesia.  You develop a rash. SEEK IMMEDIATE MEDICAL CARE IF:  You have difficulty breathing.  You have chest pain.  You have any allergic problems. Document Released: 09/27/2000 Document Revised: 02/21/2013 Document Reviewed: 01/04/2013  [facility] Patient Information 2014 [employer] [place].

## 2014-04-26 NOTE — Anesthesia Postprocedure Evaluation (Signed)
  Anesthesia Post-op Note  Patient: Chase Lowe  Procedure(s) Performed: Procedure(s): LEFT INGUINAL HERNIA REPAIR WITH MESH (Left) INSERTION OF MESH (Left)  Patient Location: PACU  Anesthesia Type:General  Level of Consciousness: awake  Airway and Oxygen Therapy: Patient Spontanous Breathing  Post-op Pain: mild  Post-op Assessment: Post-op Vital signs reviewed  Post-op Vital Signs: Reviewed  Last Vitals:  Filed Vitals:   04/26/14 0926  BP: 110/76  Pulse: 60  Temp:   Resp: 9    Complications: No apparent anesthesia complications

## 2014-04-26 NOTE — Anesthesia Preprocedure Evaluation (Signed)
Anesthesia Evaluation  Patient identified by MRN, date of birth, ID band Patient awake    Reviewed: Allergy & Precautions, H&P , NPO status , Patient's Chart, lab work & pertinent test results  Airway Mallampati: II  Neck ROM: full    Dental   Pulmonary neg pulmonary ROS,          Cardiovascular negative cardio ROS      Neuro/Psych    GI/Hepatic   Endo/Other    Renal/GU      Musculoskeletal   Abdominal   Peds  Hematology   Anesthesia Other Findings   Reproductive/Obstetrics                           Anesthesia Physical Anesthesia Plan  ASA: I  Anesthesia Plan: General and Regional   Post-op Pain Management: MAC Combined w/ Regional for Post-op pain   Induction: Intravenous  Airway Management Planned: LMA  Additional Equipment:   Intra-op Plan:   Post-operative Plan:   Informed Consent: I have reviewed the patients History and Physical, chart, labs and discussed the procedure including the risks, benefits and alternatives for the proposed anesthesia with the patient or authorized representative who has indicated his/her understanding and acceptance.     Plan Discussed with: CRNA, Anesthesiologist and Surgeon  Anesthesia Plan Comments:         Anesthesia Quick Evaluation  

## 2014-04-26 NOTE — Anesthesia Postprocedure Evaluation (Signed)
  Anesthesia Post-op Note  Patient: Chase Lowe  Procedure(s) Performed: Procedure(s): LEFT INGUINAL HERNIA REPAIR WITH MESH (Left) INSERTION OF MESH (Left)  Patient Location: PACU  Anesthesia Type:General  Level of Consciousness: awake  Airway and Oxygen Therapy: Patient Spontanous Breathing  Post-op Pain: mild  Post-op Assessment: Post-op Vital signs reviewed  Post-op Vital Signs: Reviewed  Last Vitals:  Filed Vitals:   04/26/14 0826  BP: 110/68  Pulse: 78  Temp:   Resp: 12    Complications: No apparent anesthesia complications

## 2014-04-26 NOTE — Transfer of Care (Signed)
Immediate Anesthesia Transfer of Care Note  Patient: Chase Lowe  Procedure(s) Performed: Procedure(s): LEFT INGUINAL HERNIA REPAIR WITH MESH (Left) INSERTION OF MESH (Left)  Patient Location: PACU  Anesthesia Type:General  Level of Consciousness: awake, sedated and patient cooperative  Airway & Oxygen Therapy: Patient Spontanous Breathing and Patient connected to nasal cannula oxygen  Post-op Assessment: Report given to PACU RN, Post -op Vital signs reviewed and stable and Patient moving all extremities X 4  Post vital signs: Reviewed and stable  Complications: No apparent anesthesia complications

## 2014-04-26 NOTE — Op Note (Signed)
LEFT INGUINAL HERNIA REPAIR WITH MESH, INSERTION OF MESH  Procedure Note  Chase BlackbirdMark B Sissel 04/26/2014   Pre-op Diagnosis: Left Inguinal Hernia     Post-op Diagnosis: same  Procedure(s): LEFT INGUINAL HERNIA REPAIR WITH MESH INSERTION OF MESH  Surgeon(s): Abigail Miyamotoouglas Kodi Guerrera, MD  Anesthesia: General  Staff:  Circulator: Ivor CostaLaurel L Edwards, RN; Megan Day Cavanaugh, RN Scrub Person: Netta CorriganAnita R Selby, RN  Estimated Blood Loss: Minimal                         Chase Lowe   Date: 04/26/2014  Time: 8:18 AM

## 2014-04-26 NOTE — Interval H&P Note (Signed)
History and Physical Interval Note: no change in H and P  04/26/2014 6:29 AM  Chase BlackbirdMark B Jaspers  has presented today for surgery, with the diagnosis of Left Inguinal Hernia  The various methods of treatment have been discussed with the patient and family. After consideration of risks, benefits and other options for treatment, the patient has consented to  Procedure(s): LEFT INGUINAL HERNIA REPAIR WITH MESH (Left) INSERTION OF MESH (Left) as a surgical intervention .  The patient's history has been reviewed, patient examined, no change in status, stable for surgery.  I have reviewed the patient's chart and labs.  Questions were answered to the patient's satisfaction.     Elridge Stemm A

## 2014-04-30 ENCOUNTER — Encounter (HOSPITAL_COMMUNITY): Payer: Self-pay | Admitting: Surgery

## 2014-06-04 ENCOUNTER — Encounter (INDEPENDENT_AMBULATORY_CARE_PROVIDER_SITE_OTHER): Payer: Self-pay | Admitting: Surgery

## 2014-09-02 LAB — HM COLONOSCOPY

## 2015-01-17 ENCOUNTER — Other Ambulatory Visit: Payer: Self-pay | Admitting: Physician Assistant

## 2015-01-17 MED ORDER — VALACYCLOVIR HCL 1 G PO TABS: ORAL_TABLET | ORAL | Status: DC

## 2015-01-17 NOTE — Progress Notes (Signed)
Pt needing rf of Valtrex for herpetic whitlow to the finger Valtrex has worked well in past Rf [facility] [place]

## 2015-02-03 HISTORY — PX: KNEE ARTHROSCOPY: SUR90

## 2015-12-04 HISTORY — PX: KNEE ARTHROSCOPY: SUR90

## 2016-02-06 ENCOUNTER — Other Ambulatory Visit: Payer: Self-pay | Admitting: Physician Assistant

## 2016-02-06 MED ORDER — VALACYCLOVIR HCL 1 G PO TABS: ORAL_TABLET | ORAL | 3 refills | Status: DC

## 2016-02-10 ENCOUNTER — Other Ambulatory Visit: Payer: Self-pay | Admitting: Surgery

## 2016-02-13 ENCOUNTER — Encounter (HOSPITAL_COMMUNITY)
Admission: RE | Admit: 2016-02-13 | Discharge: 2016-02-13 | Disposition: A | Discharge status: Home / Self Care | Payer: BLUE CROSS/BLUE SHIELD | Source: Ambulatory Visit | Attending: Surgery | Admitting: Surgery

## 2016-02-13 ENCOUNTER — Encounter (HOSPITAL_COMMUNITY): Payer: Self-pay

## 2016-02-13 DIAGNOSIS — K4090 Unilateral inguinal hernia, without obstruction or gangrene, not specified as recurrent: Secondary | ICD-10-CM | POA: Insufficient documentation

## 2016-02-13 DIAGNOSIS — Z01812 Encounter for preprocedural laboratory examination: Secondary | ICD-10-CM | POA: Insufficient documentation

## 2016-02-13 HISTORY — DX: Duodenal ulcer, unspecified as acute or chronic, without hemorrhage or perforation: K26.9

## 2016-02-13 HISTORY — DX: Personal history of other medical treatment: Z92.89

## 2016-02-13 LAB — CBC
HEMATOCRIT: 41.5 % (ref 39.0–52.0)
HEMOGLOBIN: 14.1 g/dL (ref 13.0–17.0)
MCH: 31.6 pg (ref 26.0–34.0)
MCHC: 34 g/dL (ref 30.0–36.0)
MCV: 93 fL (ref 78.0–100.0)
Platelets: 166 10*3/uL (ref 150–400)
RBC: 4.46 MIL/uL (ref 4.22–5.81)
RDW: 12.5 % (ref 11.5–15.5)
WBC: 4.2 10*3/uL (ref 4.0–10.5)

## 2016-02-13 NOTE — Pre-Procedure Instructions (Signed)
    Chase Lowe  02/13/2016    Your procedure is scheduled on Friday, August 18.  Report [name] Ad[facility] at 5:30 AM                 Your surgery or procedure is scheduled for 7:15 AM   Call this number if you have problems the morning of [phone]                 For any other questions, please call [phone], Monday - Friday 8 AM - 4 PM.   Remember:  Do not eat food or drink liquids after midnight Thursday, August 17.  Take these medicines the morning of surgery with A SIP OF WATER: None   Do not wear jewelry, make-up or nail polish.  Do not wear lotions, powders, or perfumes.     Men may shave face and neck.  Do not bring valuables to the hospital.  [facility] is not responsible for any belongings or valuables.  Contacts, dentures or bridgework may not be worn into surgery.  Leave your suitcase in the car.  After surgery it may be brought to your room.  For patients admitted to the hospital, discharge time will be determined by your treatment team.  Patients discharged the day of surgery will not be allowed to drive home.   Name and phone number of your driver: -  Please read over the following fact sheets that you were given. Cough and Deep Breathing, Pain Management

## 2016-02-19 NOTE — H&P (Signed)
Chase Lowe is an 55 y.o. male.   Chief Complaint: Right inguinal hernia HPI: This gentleman is well-known to me status post a previous left inguinal hernia with mesh. He now has a symptomatically right inguinal hernia. He is otherwise healthy without complaints.  Past Medical History:  Diagnosis Date  . Complication of anesthesia    during a colonoscopy pt. woke up during the procedure and also during a hand surgery   . Duodenal ulcer 1983  . History of blood product transfusion     Past Surgical History:  Procedure Laterality Date  . COLONOSCOPY  [id number],1999  . GANGLION CYST EXCISION Left 2006  . HEEL SPUR SURGERY Right 2013  . HEEL SPUR SURGERY Right    2nd surgery  . INGUINAL HERNIA REPAIR Left 04/26/2014   Procedure: LEFT INGUINAL HERNIA REPAIR WITH MESH;  Surgeon: Abigail Miyamotoouglas Mabeline Varas, MD;  [facility];  Service: General;  Laterality: Left;  . INSERTION OF MESH Left 04/26/2014   Procedure: INSERTION OF MESH;  Surgeon: Abigail Miyamotoouglas Arcenio Mullaly, MD;  [facility];  Service: General;  Laterality: Left;  . KNEE ARTHROSCOPY Left 02/2015   left  . KNEE ARTHROSCOPY Left 12/2015    No family history on file. Social History:  reports that he has quit smoking. His smoking use included Cigarettes. He quit after 17.00 years of use. He has never used smokeless tobacco. He reports that he drinks about 3.6 - 7.2 oz of alcohol per week . He reports that he does not use drugs.  Allergies:  Allergies  Allergen Reactions  . Aspirin Other (See Comments)    High doses contraindicated due to ulcer  . Codeine Nausea And Vomiting  . Oxycodone-Acetaminophen Nausea And Vomiting    No prescriptions prior to admission.    No results found for this or any previous visit (from the past 48 hour(s)). No results found.  Review of Systems  All other systems reviewed and are negative.   There were no vitals taken for this visit. Physical Exam  Constitutional: He is oriented to person, place, and  time. He appears well-developed and well-nourished. No distress.  HENT:  Head: Normocephalic and atraumatic.  Eyes: Conjunctivae are normal. Pupils are equal, round, and reactive to light. No scleral icterus.  Neck: Normal range of motion. Neck supple. No tracheal deviation present.  Cardiovascular: Normal rate, regular rhythm and normal heart sounds.   Respiratory: Effort normal and breath sounds normal. No respiratory distress.  GI: Soft. Bowel sounds are normal. There is no tenderness.  Small, easily reducible right inguinal hernia  Musculoskeletal: Normal range of motion. He exhibits no edema or tenderness.  Neurological: He is alert and oriented to person, place, and time.  Skin: Skin is warm and dry. No erythema.  Psychiatric: His behavior is normal. Judgment normal.     Assessment/Plan Right inguinal hernia  I discussed the diagnosis with him in detail. Plan will be to proceed with an open right inguinal hernia repair with mesh. I discussed the risks which includes but is not limited to bleeding, infection, recurrence, injury to surrounding structures, nerve entrapment, chronic pain, postoperative recovery, etc. He understands and agrees to proceed  Shelly RubensteinBLACKMAN,Sonna Lipsky A, MD 02/19/2016, 5:55 PM

## 2016-02-20 ENCOUNTER — Ambulatory Visit (HOSPITAL_COMMUNITY): Payer: BLUE CROSS/BLUE SHIELD | Admitting: Certified Registered"

## 2016-02-20 ENCOUNTER — Ambulatory Visit (HOSPITAL_COMMUNITY)
Admission: RE | Admit: 2016-02-20 | Discharge: 2016-02-20 | Disposition: A | Discharge status: Home / Self Care | Payer: BLUE CROSS/BLUE SHIELD | Source: Ambulatory Visit | Attending: Surgery | Admitting: Surgery

## 2016-02-20 ENCOUNTER — Encounter (HOSPITAL_COMMUNITY): Payer: Self-pay | Admitting: General Practice

## 2016-02-20 ENCOUNTER — Encounter (HOSPITAL_COMMUNITY)
Admission: RE | Disposition: A | Discharge status: Home / Self Care | Payer: Self-pay | Source: Ambulatory Visit | Attending: Surgery

## 2016-02-20 DIAGNOSIS — K4090 Unilateral inguinal hernia, without obstruction or gangrene, not specified as recurrent: Secondary | ICD-10-CM | POA: Insufficient documentation

## 2016-02-20 DIAGNOSIS — Z87891 Personal history of nicotine dependence: Secondary | ICD-10-CM | POA: Diagnosis not present

## 2016-02-20 HISTORY — PX: INGUINAL HERNIA REPAIR: SHX194

## 2016-02-20 HISTORY — PX: INSERTION OF MESH: SHX5868

## 2016-02-20 SURGERY — REPAIR, HERNIA, INGUINAL, ADULT
Anesthesia: Regional | Site: Groin | Laterality: Right

## 2016-02-20 MED ORDER — FENTANYL CITRATE (PF) 100 MCG/2ML IJ SOLN
INTRAMUSCULAR | Status: AC
  Filled 2016-02-20: qty 2

## 2016-02-20 MED ORDER — FENTANYL CITRATE (PF) 100 MCG/2ML IJ SOLN
INTRAMUSCULAR | Status: DC | PRN
  Administered 2016-02-20: 50 ug via INTRAVENOUS
  Administered 2016-02-20: 100 ug via INTRAVENOUS
  Administered 2016-02-20 (×2): 50 ug via INTRAVENOUS

## 2016-02-20 MED ORDER — LIDOCAINE 2% (20 MG/ML) 5 ML SYRINGE
INTRAMUSCULAR | Status: AC
  Filled 2016-02-20: qty 5

## 2016-02-20 MED ORDER — 0.9 % SODIUM CHLORIDE (POUR BTL) OPTIME
TOPICAL | Status: DC | PRN
  Administered 2016-02-20: 1000 mL

## 2016-02-20 MED ORDER — GLYCOPYRROLATE 0.2 MG/ML IV SOSY
PREFILLED_SYRINGE | INTRAVENOUS | Status: AC
  Filled 2016-02-20: qty 3

## 2016-02-20 MED ORDER — FENTANYL CITRATE (PF) 100 MCG/2ML IJ SOLN
INTRAMUSCULAR | Status: AC
  Administered 2016-02-20: 50 ug via INTRAVENOUS
  Filled 2016-02-20: qty 2

## 2016-02-20 MED ORDER — BUPIVACAINE-EPINEPHRINE (PF) 0.5% -1:200000 IJ SOLN
INTRAMUSCULAR | Status: DC | PRN
  Administered 2016-02-20: 30 mL via PERINEURAL

## 2016-02-20 MED ORDER — MIDAZOLAM HCL 2 MG/2ML IJ SOLN
INTRAMUSCULAR | Status: AC
  Filled 2016-02-20: qty 2

## 2016-02-20 MED ORDER — EPHEDRINE SULFATE 50 MG/ML IJ SOLN
INTRAMUSCULAR | Status: DC | PRN
  Administered 2016-02-20: 10 mg via INTRAVENOUS
  Administered 2016-02-20: 5 mg via INTRAVENOUS

## 2016-02-20 MED ORDER — ACETAMINOPHEN 10 MG/ML IV SOLN
INTRAVENOUS | Status: DC | PRN
  Administered 2016-02-20: 1000 mg via INTRAVENOUS

## 2016-02-20 MED ORDER — GLYCOPYRROLATE 0.2 MG/ML IJ SOLN
INTRAMUSCULAR | Status: DC | PRN
  Administered 2016-02-20: 0.2 mg via INTRAVENOUS

## 2016-02-20 MED ORDER — DEXAMETHASONE SODIUM PHOSPHATE 10 MG/ML IJ SOLN
INTRAMUSCULAR | Status: AC
  Filled 2016-02-20: qty 1

## 2016-02-20 MED ORDER — BUPIVACAINE-EPINEPHRINE (PF) 0.5% -1:200000 IJ SOLN
INTRAMUSCULAR | Status: AC
  Filled 2016-02-20: qty 30

## 2016-02-20 MED ORDER — KETOROLAC TROMETHAMINE 30 MG/ML IJ SOLN
INTRAMUSCULAR | Status: AC
  Filled 2016-02-20: qty 1

## 2016-02-20 MED ORDER — ONDANSETRON HCL 4 MG/2ML IJ SOLN
INTRAMUSCULAR | Status: AC
  Filled 2016-02-20: qty 2

## 2016-02-20 MED ORDER — DEXAMETHASONE SODIUM PHOSPHATE 10 MG/ML IJ SOLN
INTRAMUSCULAR | Status: DC | PRN
  Administered 2016-02-20: 10 mg via INTRAVENOUS

## 2016-02-20 MED ORDER — SODIUM CHLORIDE 0.9 % IJ SOLN
INTRAMUSCULAR | Status: AC
  Filled 2016-02-20: qty 10

## 2016-02-20 MED ORDER — BUPIVACAINE-EPINEPHRINE 0.5% -1:200000 IJ SOLN
INTRAMUSCULAR | Status: DC | PRN
  Administered 2016-02-20: 10 mL

## 2016-02-20 MED ORDER — LIDOCAINE HCL (CARDIAC) 20 MG/ML IV SOLN
INTRAVENOUS | Status: DC | PRN
  Administered 2016-02-20: 40 mg via INTRAVENOUS

## 2016-02-20 MED ORDER — KETOROLAC TROMETHAMINE 60 MG/2ML IM SOLN
INTRAMUSCULAR | Status: DC | PRN
  Administered 2016-02-20: 30 mg via INTRAMUSCULAR

## 2016-02-20 MED ORDER — FENTANYL CITRATE (PF) 100 MCG/2ML IJ SOLN
25.0000 ug | INTRAMUSCULAR | Status: DC | PRN
  Administered 2016-02-20 (×2): 50 ug via INTRAVENOUS

## 2016-02-20 MED ORDER — CEFAZOLIN SODIUM-DEXTROSE 2-4 GM/100ML-% IV SOLN
2.0000 g | INTRAVENOUS | Status: AC
  Administered 2016-02-20: 2 g via INTRAVENOUS
  Filled 2016-02-20: qty 100

## 2016-02-20 MED ORDER — EPHEDRINE SULFATE 50 MG/ML IJ SOLN
INTRAMUSCULAR | Status: AC
  Filled 2016-02-20: qty 1

## 2016-02-20 MED ORDER — PROPOFOL 10 MG/ML IV BOLUS
INTRAVENOUS | Status: AC
  Filled 2016-02-20: qty 20

## 2016-02-20 MED ORDER — CHLORHEXIDINE GLUCONATE CLOTH 2 % EX PADS: 6.0000 | MEDICATED_PAD | Freq: Once | CUTANEOUS | Status: DC

## 2016-02-20 MED ORDER — ACETAMINOPHEN 10 MG/ML IV SOLN
INTRAVENOUS | Status: AC
  Filled 2016-02-20: qty 100

## 2016-02-20 MED ORDER — ONDANSETRON HCL 4 MG/2ML IJ SOLN
INTRAMUSCULAR | Status: DC | PRN
  Administered 2016-02-20: 4 mg via INTRAVENOUS

## 2016-02-20 MED ORDER — MIDAZOLAM HCL 5 MG/5ML IJ SOLN
INTRAMUSCULAR | Status: DC | PRN
  Administered 2016-02-20: 2 mg via INTRAVENOUS

## 2016-02-20 MED ORDER — PROPOFOL 10 MG/ML IV BOLUS
INTRAVENOUS | Status: DC | PRN
  Administered 2016-02-20: 180 mg via INTRAVENOUS

## 2016-02-20 SURGICAL SUPPLY — 39 items
BLADE SURG 10 STRL SS (BLADE) ×3 IMPLANT
BLADE SURG 15 STRL LF DISP TIS (BLADE) ×1 IMPLANT
BLADE SURG 15 STRL SS (BLADE) ×3
BLADE SURG ROTATE 9660 (MISCELLANEOUS) IMPLANT
CHLORAPREP W/TINT 26ML (MISCELLANEOUS) ×3 IMPLANT
COVER SURGICAL LIGHT HANDLE (MISCELLANEOUS) ×3 IMPLANT
DRAIN PENROSE 1/2X12 LTX STRL (WOUND CARE) ×2 IMPLANT
DRAPE LAPAROTOMY TRNSV 102X78 (DRAPE) ×3 IMPLANT
DRAPE UTILITY XL STRL (DRAPES) ×3 IMPLANT
ELECT CAUTERY BLADE 6.4 (BLADE) ×3 IMPLANT
ELECT REM PT RETURN 9FT ADLT (ELECTROSURGICAL) ×3
ELECTRODE REM PT RTRN 9FT ADLT (ELECTROSURGICAL) ×1 IMPLANT
GLOVE BIOGEL PI IND STRL 7.0 (GLOVE) IMPLANT
GLOVE BIOGEL PI INDICATOR 7.0 (GLOVE) ×2
GLOVE SURG SIGNA 7.5 PF LTX (GLOVE) ×3 IMPLANT
GOWN STRL REUS W/ TWL LRG LVL3 (GOWN DISPOSABLE) ×1 IMPLANT
GOWN STRL REUS W/ TWL XL LVL3 (GOWN DISPOSABLE) ×1 IMPLANT
GOWN STRL REUS W/TWL LRG LVL3 (GOWN DISPOSABLE) ×3
GOWN STRL REUS W/TWL XL LVL3 (GOWN DISPOSABLE) ×3
KIT BASIN OR (CUSTOM PROCEDURE TRAY) ×3 IMPLANT
KIT ROOM TURNOVER OR (KITS) ×3 IMPLANT
LIQUID BAND (GAUZE/BANDAGES/DRESSINGS) ×3 IMPLANT
MESH PARIETEX PROGRIP RIGHT (Mesh General) ×2 IMPLANT
NDL HYPO 25GX1X1/2 BEV (NEEDLE) ×1 IMPLANT
NEEDLE HYPO 25GX1X1/2 BEV (NEEDLE) ×3 IMPLANT
NS IRRIG 1000ML POUR BTL (IV SOLUTION) ×3 IMPLANT
PACK SURGICAL SETUP 50X90 (CUSTOM PROCEDURE TRAY) ×3 IMPLANT
PAD ARMBOARD 7.5X6 YLW CONV (MISCELLANEOUS) ×3 IMPLANT
PENCIL BUTTON HOLSTER BLD 10FT (ELECTRODE) ×3 IMPLANT
SPONGE LAP 18X18 X RAY DECT (DISPOSABLE) ×3 IMPLANT
SUT MON AB 4-0 PC3 18 (SUTURE) ×3 IMPLANT
SUT SILK 2 0 SH (SUTURE) ×2 IMPLANT
SUT VIC AB 2-0 CT1 27 (SUTURE) ×6
SUT VIC AB 2-0 CT1 TAPERPNT 27 (SUTURE) ×1 IMPLANT
SUT VIC AB 3-0 CT1 27 (SUTURE) ×3
SUT VIC AB 3-0 CT1 TAPERPNT 27 (SUTURE) ×1 IMPLANT
SYR CONTROL 10ML LL (SYRINGE) ×3 IMPLANT
TOWEL OR 17X24 6PK STRL BLUE (TOWEL DISPOSABLE) ×1 IMPLANT
TOWEL OR 17X26 10 PK STRL BLUE (TOWEL DISPOSABLE) ×3 IMPLANT

## 2016-02-20 NOTE — Anesthesia Preprocedure Evaluation (Signed)
Anesthesia Evaluation  Patient identified by MRN, date of birth, ID band Patient awake    Reviewed: Allergy & Precautions, NPO status , Patient's Chart, lab work & pertinent test results  History of Anesthesia Complications (+) PONV and history of anesthetic complications  Airway Mallampati: III  TM Distance: >3 FB Neck ROM: Full    Dental  (+) Teeth Intact   Pulmonary former smoker,    breath sounds clear to auscultation       Cardiovascular negative cardio ROS   Rhythm:Regular     Neuro/Psych negative neurological ROS  negative psych ROS   GI/Hepatic Neg liver ROS, PUD,   Endo/Other  negative endocrine ROS  Renal/GU negative Renal ROS     Musculoskeletal negative musculoskeletal ROS (+)   Abdominal   Peds  Hematology negative hematology ROS (+)   Anesthesia Other Findings   Reproductive/Obstetrics                             Anesthesia Physical Anesthesia Plan  ASA: I  Anesthesia Plan: General and Regional   Post-op Pain Management:  Regional for Post-op pain   Induction: Intravenous  Airway Management Planned: LMA  Additional Equipment: None  Intra-op Plan:   Post-operative Plan: Extubation in OR  Informed Consent: I have reviewed the patients History and Physical, chart, labs and discussed the procedure including the risks, benefits and alternatives for the proposed anesthesia with the patient or authorized representative who has indicated his/her understanding and acceptance.   Dental advisory given  Plan Discussed with: CRNA and Surgeon  Anesthesia Plan Comments:         Anesthesia Quick Evaluation

## 2016-02-20 NOTE — Op Note (Signed)
NAMTito Dine:  Lorton, Jonathin              ACCOUNT NO.:  [id number]  MEDICAL RECORD NO.:  [id number]  LOCATION:  [facility]                         FACILITY:  [facility]  PHYSICIAN:  Abigail Miyamotoouglas Earline Stiner, M.D. DATE OF BIRTH:  [birth year]  DATE OF PROCEDURE:  02/20/2016 DATE OF DISCHARGE:  02/20/2016                              OPERATIVE REPORT   PREOPERATIVE DIAGNOSIS:  Right inguinal hernia.  POSTOPERATIVE DIAGNOSIS:  Right inguinal hernia.  PROCEDURE:  Right inguinal hernia repair with mesh.  SURGEON:  Abigail Miyamotoouglas Mairim Bade, M.D.  ASSISTANT:  General with tap block.  ESTIMATED BLOOD LOSS:  Minimal.  FINDINGS:  The patient was found to have an indirect right inguinal hernia, which was repaired with a piece of ProGrip Prolene mesh.  PROCEDURE IN DETAIL:  The patient was brought to the operating room, identified as [employer] Salberg.  He was placed supine on the operating table and general anesthesia was induced.  His abdomen was then prepped and draped in usual sterile fashion.  A nerve block had been applied by Anesthesia.  I anesthetized the skin of the right groin with Marcaine. I made a longitudinal incision in the right inguinal area with a scalpel.  I took this down through the Scarpa's fascia with electrocautery.  External oblique fascia was then identified and opened through the internal and external rings.  The testicular cord and structures were controlled with Penrose drain.  The patient had a small indirect hernia sac.  I was able to separate this from the cord structures and dissected down to the base, which I then tied off with a 2-0 silk suture.  I then excised the redundant sac.  Next, Proceed Prolene right-sided large mesh was brought onto the field.  I placed it as an onlay against the pubic tubercle and brought it around the cord structures on top of the transversalis fascia.  I then sutured in place with a 2-0 Vicryl suture.  Wide coverage of the internal ring and inguinal floor  appeared to be achieved as well as the cord structures. I then closed the external oblique fascia over top of this with a running 2-0 Vicryl suture.  Scarpa's fascia was then closed with interrupted 3-0 Vicryl sutures and skin was closed with a running 4-0 Monocryl.  Skin glue was then applied.  The patient tolerated the procedure well.  All sponge, needle and instrument counts were correct at the end of the procedure.  The patient was then extubated in the operating room and taken in a stable condition to the recovery room.     Abigail Miyamotoouglas Ariyel Jeangilles, M.D.     DB/MEDQ  D:  02/20/2016  T:  02/20/2016  Job:  [id number]

## 2016-02-20 NOTE — Interval H&P Note (Signed)
History and Physical Interval Note:no change in H and P  02/20/2016 6:16 AM  [name]  has presented today for surgery, with the diagnosis of Right Inguinal Hernia  The various methods of treatment have been discussed with the patient and family. After consideration of risks, benefits and other options for treatment, the patient has consented to  Procedure(s): RIGHT INGUINAL HERNIA REPAIR WITH MESH (Right) INSERTION OF MESH (Right) as a surgical intervention .  The patient's history has been reviewed, patient examined, no change in status, stable for surgery.  I have reviewed the patient's chart and labs.  Questions were answered to the patient's satisfaction.     Chase Lowe A

## 2016-02-20 NOTE — Transfer of Care (Signed)
Immediate Anesthesia Transfer of Care Note  Patient: Chase BlackbirdMark B Kosier  Procedure(s) Performed: Procedure(s): RIGHT INGUINAL HERNIA REPAIR WITH MESH (Right) INSERTION OF MESH (Right)  Patient Location: PACU  Anesthesia Type:General  Level of Consciousness: sedated  Airway & Oxygen Therapy: Patient Spontanous Breathing and Patient connected to face mask oxygen  Post-op Assessment: Report given to RN and Post -op Vital signs reviewed and stable  Post vital signs: Reviewed  Last Vitals:  Vitals:   02/20/16 0615 02/20/16 0819  BP:  116/74  Pulse:  80  Resp:  10  Temp: 36.7 C     Last Pain: There were no vitals filed for this visit.    Patients Stated Pain Goal: 2 (02/20/16 0557)  Complications: No apparent anesthesia complications

## 2016-02-20 NOTE — Anesthesia Procedure Notes (Signed)
Anesthesia Regional Block:  TAP block  Pre-Anesthetic Checklist: ,, timeout performed, Correct Patient, Correct Site, Correct Laterality, Correct Procedure, Correct Position, risks and benefits discussed, surgical consent, pre-op evaluation,  At surgeon's request and post-op pain management  Laterality: Right  Prep: chloraprep       Needles:  Injection technique: Single-shot  Needle Type: Echogenic Needle          Additional Needles:  Procedures: ultrasound guided (picture in chart) TAP block Narrative:  Injection made incrementally with aspirations every 5 mL.  Performed by: Personally   Additional Notes: H+P and labs reviewed, risks and benefits discussed with patient, procedure tolerated well without complications

## 2016-02-20 NOTE — Discharge Instructions (Signed)
[facility]  UMBILICAL OR INGUINAL HERNIA REPAIR: POST OP INSTRUCTIONS  Always review your discharge instruction sheet given to you by the facility where your surgery was performed. IF YOU HAVE DISABILITY OR FAMILY LEAVE FORMS, YOU MUST BRING THEM TO THE OFFICE FOR PROCESSING.   DO NOT GIVE THEM TO YOUR DOCTOR.  1. A  prescription for pain medication may be given to you upon discharge.  Take your pain medication as prescribed, if needed.  If narcotic pain medicine is not needed, then you may take acetaminophen (Tylenol) or ibuprofen (Advil) as needed. 2. Take your usually prescribed medications unless otherwise directed. 3. If you need a refill on your pain medication, please contact your pharmacy.  They will contact our office to request authorization. Prescriptions will not be filled after 5 pm or on week-ends. 4. You should follow a light diet the first 24 hours after arrival home, such as soup and crackers, etc.  Be sure to include lots of fluids daily.  Resume your normal diet the day after surgery. 5. Most patients will experience some swelling and bruising around the umbilicus or in the groin and scrotum.  Ice packs and reclining will help.  Swelling and bruising can take several days to resolve.  6. It is common to experience some constipation if taking pain medication after surgery.  Increasing fluid intake and taking a stool softener (such as Colace) will usually help or prevent this problem from occurring.  A mild laxative (Milk of Magnesia or Miralax) should be taken according to package directions if there are no bowel movements after 48 hours. 7. Unless discharge instructions indicate otherwise, you may remove your bandages 24-48 hours after surgery, and you may shower at that time.  You may have steri-strips (small skin tapes) in place directly over the incision.  These strips should be left on the skin for 7-10 days.  If your surgeon used skin glue on the  incision, you may shower in 24 hours.  The glue will flake off over the next 2-3 weeks.  Any sutures or staples will be removed at the office during your follow-up visit. 8. ACTIVITIES:  You may resume regular (light) daily activities beginning the next day--such as daily self-care, walking, climbing stairs--gradually increasing activities as tolerated.  You may have sexual intercourse when it is comfortable.  Refrain from any heavy lifting or straining until approved by your doctor. a. You may drive when you are no longer taking prescription pain medication, you can comfortably wear a seatbelt, and you can safely maneuver your car and apply brakes. b. RETURN TO WORK:  __________________________________________________________ 9. You should see your doctor in the office for a follow-up appointment approximately 2-3 weeks after your surgery.  Make sure that you call for this appointment within a day or two after you arrive home to insure a convenient appointment time. 10. OTHER INSTRUCTIONS: NO LIFTING MORE THAN 15 TO 20 POUNDS FOR 4 WEEKS 11. ICE PACK AND IBUPROFEN ALSO FOR PAIN 12. MAY SHOWER TOMORROW __________________________________________________________________________________________________________________________________________________________________________________________  WHEN TO CALL YOUR DOCTOR: 1. Fever over 101.0 2. Inability to urinate 3. Nausea and/or vomiting 4. Extreme swelling or bruising 5. Continued bleeding from incision. 6. Increased pain, redness, or drainage from the incision  The clinic staff is available to answer your questions during regular business hours.  Please dont hesitate to call and ask to speak to one of the nurses for clinical concerns.  If you have a medical emergency, go to the  nearest emergency room or call 911.  A surgeon from [facility] is always on call at the hospital   [address] [place], [place], [place]  [address] ?   [address], [place], [place]   [address] [phone] ? [phone] ? FAX [phone] Web site: [email or web address]

## 2016-02-20 NOTE — Anesthesia Procedure Notes (Signed)
Procedure Name: LMA Insertion Date/Time: 02/20/2016 7:22 AM Performed by: Doyce LooseHOFFMAN, Doris Mcgilvery ANN Pre-anesthesia Checklist: Patient identified, Emergency Drugs available, Suction available and Patient being monitored Patient Re-evaluated:Patient Re-evaluated prior to inductionOxygen Delivery Method: Circle System Utilized Preoxygenation: Pre-oxygenation with 100% oxygen Intubation Type: IV induction Ventilation: Mask ventilation without difficulty LMA: LMA inserted LMA Size: 5.0 Number of attempts: 1 Airway Equipment and Method: Bite block Placement Confirmation: positive ETCO2 Tube secured with: Tape Dental Injury: Teeth and Oropharynx as per pre-operative assessment

## 2016-02-20 NOTE — Anesthesia Postprocedure Evaluation (Signed)
Anesthesia Post Note  Patient: Chase BlackbirdMark B Lowe  Procedure(s) Performed: Procedure(s) (LRB): RIGHT INGUINAL HERNIA REPAIR WITH MESH (Right) INSERTION OF MESH (Right)  Patient location during evaluation: PACU Anesthesia Type: General and Regional Level of consciousness: awake Pain management: pain level controlled Vital Signs Assessment: post-procedure vital signs reviewed and stable Respiratory status: spontaneous breathing Cardiovascular status: stable Postop Assessment: no signs of nausea or vomiting Anesthetic complications: no    Last Vitals:  Vitals:   02/20/16 1005 02/20/16 1015  BP: 112/81   Pulse: 83 86  Resp: 11 13  Temp:      Last Pain:  Vitals:   02/20/16 1015  PainSc: 2                  Jorie Zee

## 2016-02-20 NOTE — Op Note (Signed)
RIGHT INGUINAL HERNIA REPAIR WITH MESH, INSERTION OF MESH  Procedure Note  Vic BlackbirdMark B Phung 02/20/2016   Pre-op Diagnosis: Right Inguinal Hernia     Post-op Diagnosis: same  Procedure(s): RIGHT INGUINAL HERNIA REPAIR WITH MESH INSERTION OF MESH  Surgeon(s): Abigail Miyamotoouglas Kiante Ciavarella, MD  Anesthesia: General  Staff:  Circulator: Tenna ChildLauren C Smail, RN Scrub Person: Donella StadeNoukone M Panchit Circulator Assistant: Lowella PettiesMegan D Cavanaugh, RN  Estimated Blood Loss: Minimal                         Bettey Muraoka A   Date: 02/20/2016  Time: 8:12 AM

## 2016-02-23 ENCOUNTER — Encounter (HOSPITAL_COMMUNITY): Payer: Self-pay | Admitting: Surgery

## 2016-06-21 ENCOUNTER — Encounter (INDEPENDENT_AMBULATORY_CARE_PROVIDER_SITE_OTHER): Payer: Self-pay

## 2016-06-21 ENCOUNTER — Ambulatory Visit (INDEPENDENT_AMBULATORY_CARE_PROVIDER_SITE_OTHER): Payer: BLUE CROSS/BLUE SHIELD | Admitting: Orthopaedic Surgery

## 2016-06-21 DIAGNOSIS — M67431 Ganglion, right wrist: Secondary | ICD-10-CM

## 2016-06-21 MED ORDER — LIDOCAINE HCL 1 % IJ SOLN
1.0000 mL | INTRAMUSCULAR | Status: AC | PRN
  Administered 2016-06-21: 1 mL

## 2016-06-21 MED ORDER — METHYLPREDNISOLONE ACETATE 40 MG/ML IJ SUSP
40.0000 mg | INTRAMUSCULAR | Status: AC | PRN
  Administered 2016-06-21: 40 mg

## 2016-06-21 MED ORDER — DICLOFENAC SODIUM 1 % TD GEL: 2.0000 g | Freq: Four times a day (QID) | TRANSDERMAL | 3 refills | Status: DC

## 2016-06-21 NOTE — Progress Notes (Signed)
Office Visit Note   Patient: Chase BlackbirdMark B Overbaugh           Date of Birth: 05/17/1961           MRN: [id number] Visit Date: 06/21/2016              Requested by: Joycelyn RuaStephen Meyers, MD [address] [place], [place] [address] PCP: Joycelyn RuaMEYERS, STEPHEN, MD   Assessment & Plan: Visit Diagnoses:  1. Ganglion of right wrist     Plan: He tolerated the injection well and his right wrist. I'll see him back in a month to see how is doing overall. If he still having some pain and feels like there is a cyst there would like to get an AP and lateral of his right wrist.  Follow-Up Instructions: Return in about 4 weeks (around 07/19/2016).   Orders:  No orders of the defined types were placed in this encounter.  No orders of the defined types were placed in this encounter.     Procedures: Hand/UE Inj Date/Time: 06/21/2016 9:43 AM Performed by: Kathryne HitchBLACKMAN, CHRISTOPHER Y Authorized by: Kathryne HitchBLACKMAN, CHRISTOPHER Y   Condition: dorsal carpal ganglion   Approach:  Dorsal Medications:  1 mL lidocaine 1 %; 40 mg methylPREDNISolone acetate 40 MG/ML     Clinical Data: No additional findings.   Subjective: No chief complaint on file. 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 He is pain of the dorsum of his wrist as well as stiffness in the wrist and fingers for a little while now. He feel that he developed a dorsal ganglion on his wrist. He's had this before and his left side. It is been causing some pain with extremes of flexion and dorsiflexion of his wrist and wanted to get this evaluated today. He denies a numbness and tingling in his hand.  HPI  Review of Systems He currently denies any headache, shortness of breath, chest pain, fever, chills, nausea, vomiting  Objective: Vital Signs: There were no vitals taken for this visit.  Physical Exam He is alert and oriented 3 in no acute distress robs discomfort Ortho  Exam Examination of his right wrist shows possibly a palpable cyst on the dorsal aspect of his wrist but is not visible. He hurts with extreme dorsiflexion and palmar flexion is just stiff in general with full range of motion is neurovascular intact in his hand is well-perfused. Specialty Comments:  No specialty comments available.  Imaging: No results found.   PMFS History: There are no active problems to display for this patient.  Past Medical History:  Diagnosis Date  . Complication of anesthesia    during a colonoscopy pt. woke up during the procedure and also during a hand surgery   . Duodenal ulcer 1983  . History of blood product transfusion     No family history on file.  Past Surgical History:  Procedure Laterality Date  . COLONOSCOPY  [id number],1999  . GANGLION CYST EXCISION Left 2006  . HEEL SPUR SURGERY Right 2013  . HEEL SPUR SURGERY Right    2nd surgery  . INGUINAL HERNIA REPAIR Left 04/26/2014   Procedure: LEFT INGUINAL HERNIA REPAIR WITH MESH;  Surgeon: Abigail Miyamotoouglas Blackman, MD;  [facility];  Service: General;  Laterality: Left;  . INGUINAL HERNIA REPAIR Right 02/20/2016   Procedure: RIGHT INGUINAL HERNIA REPAIR WITH MESH;  Surgeon: Abigail Miyamotoouglas Blackman, MD;  [facility];  Service: General;  Laterality: Right;  . INSERTION OF MESH Left 04/26/2014   Procedure: INSERTION OF MESH;  Surgeon: Abigail Miyamotoouglas Blackman, MD;  [facility];  Service: General;  Laterality: Left;  . INSERTION OF MESH Right 02/20/2016   Procedure: INSERTION OF MESH;  Surgeon: Abigail Miyamotoouglas Blackman, MD;  [facility];  Service: General;  Laterality: Right;  . KNEE ARTHROSCOPY Left 02/2015   left  . KNEE ARTHROSCOPY Left 12/2015   Social History   Occupational History  . Not on file.   Social History Main Topics  . Smoking status: Former Smoker    Years: 17.00    Types: Cigarettes  . Smokeless tobacco: Never Used     Comment: quit 1989  . Alcohol use 3.6 - 7.2 oz/week    6 - 12 Cans of beer  per week  . Drug use: No  . Sexual activity: Not on file

## 2016-07-02 ENCOUNTER — Telehealth (INDEPENDENT_AMBULATORY_CARE_PROVIDER_SITE_OTHER): Payer: Self-pay | Admitting: Orthopaedic Surgery

## 2016-07-02 NOTE — Telephone Encounter (Signed)
Called in.

## 2016-07-16 ENCOUNTER — Other Ambulatory Visit (INDEPENDENT_AMBULATORY_CARE_PROVIDER_SITE_OTHER): Payer: Self-pay

## 2016-07-16 DIAGNOSIS — M25531 Pain in right wrist: Secondary | ICD-10-CM

## 2016-07-19 ENCOUNTER — Ambulatory Visit (INDEPENDENT_AMBULATORY_CARE_PROVIDER_SITE_OTHER): Payer: PRIVATE HEALTH INSURANCE | Admitting: Orthopaedic Surgery

## 2016-07-19 ENCOUNTER — Other Ambulatory Visit (INDEPENDENT_AMBULATORY_CARE_PROVIDER_SITE_OTHER): Payer: Self-pay

## 2016-07-19 ENCOUNTER — Ambulatory Visit (HOSPITAL_BASED_OUTPATIENT_CLINIC_OR_DEPARTMENT_OTHER)
Admission: RE | Admit: 2016-07-19 | Discharge: 2016-07-19 | Disposition: A | Discharge status: Home / Self Care | Payer: BLUE CROSS/BLUE SHIELD | Source: Ambulatory Visit | Attending: Orthopaedic Surgery | Admitting: Orthopaedic Surgery

## 2016-07-19 DIAGNOSIS — M25532 Pain in left wrist: Secondary | ICD-10-CM

## 2016-07-19 DIAGNOSIS — M25531 Pain in right wrist: Secondary | ICD-10-CM | POA: Diagnosis not present

## 2016-07-19 NOTE — Progress Notes (Signed)
Office Visit Note   Patient: Chase BlackbirdMark B Marter           Date of Birth: 1960-08-06           MRN: [id number] Visit Date: 07/19/2016              Requested by: Joycelyn RuaStephen Meyers, MD [address] [place], [place] [address] PCP: Joycelyn RuaMEYERS, STEPHEN, MD   Assessment & Plan: Visit Diagnoses:  1. Pain in right wrist     Plan: At this point he definitely needs an MR arthrogram of the right wrist to assess the scapholunate ligament based on my clinical exam. Also want to assess for severe tenosynovitis of the third dorsal compartment. Certainly this can be an intersection syndrome or cystic type structure in this area as well. Given the failure of all forms of conservative treatment and how now this is distally affecting his job and his livelihood and MR arthrogram is deathly warranted to assess for pathology in the wrist aced on what he and I am feeling on exam.  Follow-Up Instructions: Return in about 2 weeks (around 08/02/2016).   Orders:  No orders of the defined types were placed in this encounter.  No orders of the defined types were placed in this encounter.     Procedures: No procedures performed   Clinical Data: No additional findings.   Subjective: No chief complaint on file. 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 He is someone I've seen before for his right wrist. He feels that there is something on the dorsum of his wrist and deep in the wrist joint and is concerned about type cystic structure. It hurts with activities. He is dropping things. I've asked to seen him for this before and performed an intra-articular and soft tissue steroid injection on the wrist that helped for a few days but now things have been getting worse. He still consistent with his complaints of the wrist hurts with dorsiflexion and is now affecting his job significantly. He is tried and failed all forms of  conservative treatment including rest, ice, heat, anti-inflammatories, activity modification, strengthening exercises of the wrist, and a 7 steroid injections.  HPI  Review of Systems He denies any chest pain, shortness of breath, fever, chills, nausea, vomiting. He denies any numbness in his hand and fingers.  Objective: Vital Signs: There were no vitals taken for this visit.  Physical Exam He is alert and oriented 3 Ortho Exam Examination of his right wrist shows pain over the third and fourth dorsal compartments of the wrist. There is slight clicking sensation and pain with dorsiflexion the wrist and somewhat palmar flexion. I can certainly palpate some type of mass type of structure deep and this may be severe synovitis of the dorsal compartment but certainly concerned of this being other pathology of the wrist. There is deathly pain from radial to ulnar deviation and a clicking in the wrist as well. Specialty Comments:  No specialty comments available.  Imaging: Dg Wrist 2 Views Right  Result Date: 07/19/2016 CLINICAL DATA:  Right wrist pain for 2 months EXAM: RIGHT WRIST - 2 VIEW COMPARISON:  None. FINDINGS: There is no evidence of fracture or dislocation. There is no evidence of arthropathy or other focal bone abnormality. Soft tissues are unremarkable. IMPRESSION: Negative. Electronically Signed   By: Signa Kellaylor  Stroud M.D.   On: 07/19/2016 08:51  PMFS History: There are no active problems to display for this patient.  Past Medical History:  Diagnosis Date  . Complication of anesthesia    during a colonoscopy pt. woke up during the procedure and also during a hand surgery   . Duodenal ulcer 1983  . History of blood product transfusion     No family history on file.  Past Surgical History:  Procedure Laterality Date  . COLONOSCOPY  [id number]  . GANGLION CYST EXCISION Left 2006  . HEEL SPUR SURGERY Right 2013  . HEEL SPUR SURGERY Right    2nd surgery  . INGUINAL  HERNIA REPAIR Left 04/26/2014   Procedure: LEFT INGUINAL HERNIA REPAIR WITH MESH;  Surgeon: Abigail Miyamoto, MD;  Location: [facility] OR;  Service: General;  Laterality: Left;  . INGUINAL HERNIA REPAIR Right 02/20/2016   Procedure: RIGHT INGUINAL HERNIA REPAIR WITH MESH;  Surgeon: Abigail Miyamoto, MD;  Location: [facility] OR;  Service: General;  Laterality: Right;  . INSERTION OF MESH Left 04/26/2014   Procedure: INSERTION OF MESH;  Surgeon: Abigail Miyamoto, MD;  Location: [facility] OR;  Service: General;  Laterality: Left;  . INSERTION OF MESH Right 02/20/2016   Procedure: INSERTION OF MESH;  Surgeon: Abigail Miyamoto, MD;  Location: [facility] OR;  Service: General;  Laterality: Right;  . KNEE ARTHROSCOPY Left 02/2015   left  . KNEE ARTHROSCOPY Left 12/2015   Social History   Occupational History  . Not on file.   Social History Main Topics  . Smoking status: Former Smoker    Years: 17.00    Types: Cigarettes  . Smokeless tobacco: Never Used     Comment: quit 1989  . Alcohol use 3.6 - 7.2 oz/week    6 - 12 Cans of beer per week  . Drug use: No  . Sexual activity: Not on file

## 2016-07-23 ENCOUNTER — Telehealth (INDEPENDENT_AMBULATORY_CARE_PROVIDER_SITE_OTHER): Payer: Self-pay | Admitting: *Deleted

## 2016-07-23 DIAGNOSIS — M25531 Pain in right wrist: Secondary | ICD-10-CM

## 2016-07-23 NOTE — Telephone Encounter (Signed)
Pt calling asking for a call back  

## 2016-07-23 NOTE — Telephone Encounter (Signed)
Call was forwarded to me in regards to pt states he saw Dr. Magnus IvanBlackman last week and he had mentioned he was going to schedule MRI arthrogram of Right wrist. I looked in pt chart saw notes for MR Arthrogram R wrist. I am going to put order in and try to get pt scheduled before appt next week per pt.

## 2016-07-26 ENCOUNTER — Telehealth (INDEPENDENT_AMBULATORY_CARE_PROVIDER_SITE_OTHER): Payer: Self-pay | Admitting: *Deleted

## 2016-07-26 NOTE — Telephone Encounter (Signed)
Patient called in this afternoon in regards to his MRI he is needing to have done he was waiting to hear back from someone about possibly being seen somewhere sooner? His CB # [phone]. Thank you

## 2016-07-26 NOTE — Telephone Encounter (Signed)
This is the one I sent you earlier.

## 2016-07-26 NOTE — Telephone Encounter (Signed)
Patient states his appt for an MRI isn';t until the 31st. Is there anyway we can get him in before that somewhere else? He has a FU appt with us sooner than that and also depending on what we find in MRI he may have to cancel scheduled trip

## 2016-07-27 NOTE — Telephone Encounter (Signed)
Pt [place] app[place] [facility] [place] Friday Jan [place], pt is aware.

## 2016-07-27 NOTE — Telephone Encounter (Signed)
Pt has appt at [facility] [place] [facility] [place]

## 2016-08-02 ENCOUNTER — Ambulatory Visit (INDEPENDENT_AMBULATORY_CARE_PROVIDER_SITE_OTHER): Payer: Self-pay | Admitting: Orthopaedic Surgery

## 2016-08-02 ENCOUNTER — Encounter (INDEPENDENT_AMBULATORY_CARE_PROVIDER_SITE_OTHER): Payer: Self-pay

## 2016-08-02 DIAGNOSIS — M25531 Pain in right wrist: Secondary | ICD-10-CM | POA: Insufficient documentation

## 2016-08-02 NOTE — Progress Notes (Signed)
The patient was coming in today for follow-up of an MRI arthrogram of his right wrist. This was just done this past Friday. However today I have neither the images nor a report from the radiologist. This is a no charge visit. I will call him with the results of the MRI soon as I haven't him.

## 2016-08-03 ENCOUNTER — Telehealth (INDEPENDENT_AMBULATORY_CARE_PROVIDER_SITE_OTHER): Payer: Self-pay | Admitting: Orthopaedic Surgery

## 2016-08-03 NOTE — Telephone Encounter (Signed)
FYI

## 2016-08-03 NOTE — Telephone Encounter (Signed)
Patient called advised he has an appointment with Dr Orlan [facility]ensrtman at [facility]. Patient said he has seen him before and set up an appointment for Thursday so he does not need a referral. The number to contact patient is [phone]

## 2016-08-04 ENCOUNTER — Other Ambulatory Visit: Payer: BLUE CROSS/BLUE SHIELD

## 2016-08-15 ENCOUNTER — Other Ambulatory Visit: Payer: Self-pay | Admitting: Physician Assistant

## 2017-06-22 NOTE — Pre-Procedure Instructions (Signed)
Vic BlackbirdMark B Jasek  06/22/2017      [id number] - [place], [place] - [address] [place] [address] Phone: [phone] Fax: [phone]    Your procedure is scheduled on June 30, 2017.  [facility] at 530 AM.  Call this number if you have problems the morning of surgery:  [phone]   Remember:  Do not eat food or drink liquids after midnight.  Take these medicines the morning of surgery with A SIP OF WATER acetaminophen (tylenol), lansoprazole (prevacid).   7 days prior to surgery STOP taking any diclofenac (voltaren) gel,  Aspirin (unless otherwise instructed by your surgeon), Aleve, Naproxen, Ibuprofen, Motrin, Advil, Goody's, BC's, all herbal medications, fish oil, and all vitamins  Continue all other medications as instructed by your physician except follow the above medication instructions before surgery   Do not wear jewelry, make-up or nail polish.  Do not wear lotions, powders, or perfumes, or deodorant.  Men may shave face and neck.  Do not bring valuables to the hospital.  [facility] is not responsible for any belongings or valuables.  Contacts, dentures or bridgework may not be worn into surgery.  Leave your suitcase in the car.  After surgery it may be brought to your room.  For patients admitted to the hospital, discharge time will be determined by your treatment team.  Patients discharged the day of surgery will not be allowed to drive home.   Special instructions:   [place]- Preparing For Surgery  Before surgery, you can play an important role. Because skin is not sterile, your skin needs to be as free of germs as possible. You can reduce the number of germs on your skin by washing with CHG (chlorahexidine gluconate) Soap before surgery.  CHG is an antiseptic cleaner which kills germs and bonds with the skin to continue killing germs even after  washing.  Please do not use if you have an allergy to CHG or antibacterial soaps. If your skin becomes reddened/irritated stop using the CHG.  Do not shave (including legs and underarms) for at least 48 hours prior to first CHG shower. It is OK to shave your face.  Please follow these instructions carefully.   1. Shower the NIGHT BEFORE SURGERY and the MORNING OF SURGERY with CHG.   2. If you chose to wash your hair, wash your hair first as usual with your normal shampoo.  3. After you shampoo, rinse your hair and body thoroughly to remove the shampoo.  4. Use CHG as you would any other liquid soap. You can apply CHG directly to the skin and wash gently with a scrungie or a clean washcloth.   5. Apply the CHG Soap to your body ONLY FROM THE NECK DOWN.  Do not use on open wounds or open sores. Avoid contact with your eyes, ears, mouth and genitals (private parts). Wash Face and genitals (private parts)  with your normal soap.  6. Wash thoroughly, paying special attention to the area where your surgery will be performed.  7. Thoroughly rinse your body with warm water from the neck down.  8. DO NOT shower/wash with your normal soap after using and rinsing off the CHG Soap.  9. Pat yourself dry with a CLEAN TOWEL.  10. Wear CLEAN PAJAMAS to bed the night before surgery, wear comfortable clothes the morning of surgery  11. Place CLEAN  SHEETS on your bed the night of your first shower and DO NOT SLEEP WITH PETS.   Day of Surgery: Do not apply any deodorants/lotions. Please wear clean clothes to the hospital/surgery center.    Please read over the following fact sheets that you were given. Pain Booklet, Coughing and Deep Breathing, MRSA Information and Surgical Site Infection Prevention

## 2017-06-23 ENCOUNTER — Other Ambulatory Visit: Payer: Self-pay

## 2017-06-23 ENCOUNTER — Encounter (HOSPITAL_COMMUNITY)
Admission: RE | Admit: 2017-06-23 | Discharge: 2017-06-23 | Disposition: A | Discharge status: Home / Self Care | Payer: PRIVATE HEALTH INSURANCE | Source: Ambulatory Visit | Attending: Orthopedic Surgery | Admitting: Orthopedic Surgery

## 2017-06-23 ENCOUNTER — Encounter (HOSPITAL_COMMUNITY): Payer: Self-pay

## 2017-06-23 DIAGNOSIS — Z01812 Encounter for preprocedural laboratory examination: Secondary | ICD-10-CM | POA: Insufficient documentation

## 2017-06-23 DIAGNOSIS — M5412 Radiculopathy, cervical region: Secondary | ICD-10-CM | POA: Insufficient documentation

## 2017-06-23 HISTORY — DX: Gastro-esophageal reflux disease without esophagitis: K21.9

## 2017-06-23 LAB — CBC
HCT: 40.8 % (ref 39.0–52.0)
HEMOGLOBIN: 14 g/dL (ref 13.0–17.0)
MCH: 31.6 pg (ref 26.0–34.0)
MCHC: 34.3 g/dL (ref 30.0–36.0)
MCV: 92.1 fL (ref 78.0–100.0)
PLATELETS: 149 10*3/uL — AB (ref 150–400)
RBC: 4.43 MIL/uL (ref 4.22–5.81)
RDW: 12.8 % (ref 11.5–15.5)
WBC: 3.8 10*3/uL — ABNORMAL LOW (ref 4.0–10.5)

## 2017-06-23 LAB — SURGICAL PCR SCREEN
MRSA, PCR: NEGATIVE
STAPHYLOCOCCUS AUREUS: NEGATIVE

## 2017-06-23 NOTE — Progress Notes (Signed)
ZOX:WRUEAVPCP:Meyers, Jeannett SeniorStephen, MD  Cardiologist: pt denies  EKG: pt denies past year  Stress test: pt denies ever  ECHO: pt denies ever  Cardiac Cath:pt denies ever  Chest x-ray:pt denies past year, no recent respiratory complications/infections

## 2017-06-29 NOTE — Anesthesia Preprocedure Evaluation (Addendum)
Anesthesia Evaluation  Patient identified by MRN, date of birth, ID band Patient awake    Reviewed: Allergy & Precautions, NPO status , Patient's Chart, lab work & pertinent test results  History of Anesthesia Complications (+) PONV and history of anesthetic complications  Airway Mallampati: III  TM Distance: >3 FB Neck ROM: Full    Dental  (+) Teeth Intact   Pulmonary former smoker,    Pulmonary exam normal        Cardiovascular negative cardio ROS Normal cardiovascular exam     Neuro/Psych negative neurological ROS  negative psych ROS   GI/Hepatic Neg liver ROS, PUD, GERD  Controlled,  Endo/Other  negative endocrine ROS  Renal/GU negative Renal ROS     Musculoskeletal negative musculoskeletal ROS (+)   Abdominal   Peds  Hematology negative hematology ROS (+)   Anesthesia Other Findings  C5-6 Left Cervical radiculopathy  Reproductive/Obstetrics                            Anesthesia Physical  Anesthesia Plan  ASA: I  Anesthesia Plan: General   Post-op Pain Management:    Induction: Intravenous  PONV Risk Score and Plan: 3 and Midazolam, Dexamethasone and Ondansetron  Airway Management Planned: Oral ETT and Video Laryngoscope Planned  Additional Equipment: None  Intra-op Plan:   Post-operative Plan: Extubation in OR  Informed Consent: I have reviewed the patients History and Physical, chart, labs and discussed the procedure including the risks, benefits and alternatives for the proposed anesthesia with the patient or authorized representative who has indicated his/her understanding and acceptance.   Dental advisory given  Plan Discussed with: CRNA  Anesthesia Plan Comments:        Anesthesia Quick Evaluation

## 2017-06-30 ENCOUNTER — Ambulatory Visit (HOSPITAL_COMMUNITY): Payer: PRIVATE HEALTH INSURANCE | Admitting: Anesthesiology

## 2017-06-30 ENCOUNTER — Encounter (HOSPITAL_COMMUNITY): Payer: Self-pay | Admitting: *Deleted

## 2017-06-30 ENCOUNTER — Ambulatory Visit (HOSPITAL_COMMUNITY): Payer: PRIVATE HEALTH INSURANCE

## 2017-06-30 ENCOUNTER — Encounter (HOSPITAL_COMMUNITY)
Admission: RE | Disposition: A | Discharge status: Home / Self Care | Payer: Self-pay | Source: Ambulatory Visit | Attending: Orthopedic Surgery

## 2017-06-30 ENCOUNTER — Observation Stay (HOSPITAL_COMMUNITY)
Admission: RE | Admit: 2017-06-30 | Discharge: 2017-07-01 | Disposition: A | Discharge status: Home / Self Care | Payer: PRIVATE HEALTH INSURANCE | Source: Ambulatory Visit | Attending: Orthopedic Surgery | Admitting: Orthopedic Surgery

## 2017-06-30 DIAGNOSIS — Z87891 Personal history of nicotine dependence: Secondary | ICD-10-CM | POA: Insufficient documentation

## 2017-06-30 DIAGNOSIS — M2578 Osteophyte, vertebrae: Secondary | ICD-10-CM | POA: Diagnosis present

## 2017-06-30 DIAGNOSIS — Z7982 Long term (current) use of aspirin: Secondary | ICD-10-CM | POA: Insufficient documentation

## 2017-06-30 DIAGNOSIS — M1711 Unilateral primary osteoarthritis, right knee: Secondary | ICD-10-CM | POA: Insufficient documentation

## 2017-06-30 DIAGNOSIS — Z419 Encounter for procedure for purposes other than remedying health state, unspecified: Secondary | ICD-10-CM

## 2017-06-30 DIAGNOSIS — Z981 Arthrodesis status: Secondary | ICD-10-CM

## 2017-06-30 DIAGNOSIS — K219 Gastro-esophageal reflux disease without esophagitis: Secondary | ICD-10-CM | POA: Diagnosis not present

## 2017-06-30 DIAGNOSIS — M50122 Cervical disc disorder at C5-C6 level with radiculopathy: Principal | ICD-10-CM | POA: Insufficient documentation

## 2017-06-30 DIAGNOSIS — Z79899 Other long term (current) drug therapy: Secondary | ICD-10-CM | POA: Insufficient documentation

## 2017-06-30 HISTORY — PX: ANTERIOR CERVICAL DECOMP/DISCECTOMY FUSION: SHX1161

## 2017-06-30 SURGERY — ANTERIOR CERVICAL DECOMPRESSION/DISCECTOMY FUSION 1 LEVEL
Anesthesia: General

## 2017-06-30 MED ORDER — EPHEDRINE SULFATE 50 MG/ML IJ SOLN
INTRAMUSCULAR | Status: DC | PRN
  Administered 2017-06-30: 10 mg via INTRAVENOUS

## 2017-06-30 MED ORDER — SODIUM CHLORIDE 0.9% FLUSH: 3.0000 mL | Freq: Two times a day (BID) | INTRAVENOUS | Status: DC

## 2017-06-30 MED ORDER — SUCCINYLCHOLINE CHLORIDE 200 MG/10ML IV SOSY
PREFILLED_SYRINGE | INTRAVENOUS | Status: AC
  Filled 2017-06-30: qty 10

## 2017-06-30 MED ORDER — PHENOL 1.4 % MT LIQD: 1.0000 | OROMUCOSAL | Status: DC | PRN

## 2017-06-30 MED ORDER — ROCURONIUM BROMIDE 100 MG/10ML IV SOLN
INTRAVENOUS | Status: DC | PRN
  Administered 2017-06-30: 50 mg via INTRAVENOUS
  Administered 2017-06-30: 20 mg via INTRAVENOUS

## 2017-06-30 MED ORDER — LACTATED RINGERS IV SOLN: INTRAVENOUS | Status: DC

## 2017-06-30 MED ORDER — HYDROXYZINE HCL 50 MG/ML IM SOLN
50.0000 mg | Freq: Four times a day (QID) | INTRAMUSCULAR | Status: DC | PRN
  Administered 2017-06-30: 50 mg via INTRAMUSCULAR
  Filled 2017-06-30: qty 1

## 2017-06-30 MED ORDER — LIDOCAINE HCL (CARDIAC) 20 MG/ML IV SOLN
INTRAVENOUS | Status: DC | PRN
  Administered 2017-06-30: 40 mg via INTRAVENOUS
  Administered 2017-06-30: 60 mg via INTRAVENOUS

## 2017-06-30 MED ORDER — LACTATED RINGERS IV SOLN
INTRAVENOUS | Status: DC | PRN
  Administered 2017-06-30: 07:00:00 via INTRAVENOUS

## 2017-06-30 MED ORDER — DEXAMETHASONE SODIUM PHOSPHATE 10 MG/ML IJ SOLN
INTRAMUSCULAR | Status: AC
  Filled 2017-06-30: qty 1

## 2017-06-30 MED ORDER — SUCCINYLCHOLINE CHLORIDE 20 MG/ML IJ SOLN
INTRAMUSCULAR | Status: DC | PRN
  Administered 2017-06-30: 100 mg via INTRAVENOUS

## 2017-06-30 MED ORDER — HEMOSTATIC AGENTS (NO CHARGE) OPTIME
TOPICAL | Status: DC | PRN
  Administered 2017-06-30 (×2): 1 via TOPICAL

## 2017-06-30 MED ORDER — LIDOCAINE 2% (20 MG/ML) 5 ML SYRINGE
INTRAMUSCULAR | Status: AC
  Filled 2017-06-30: qty 5

## 2017-06-30 MED ORDER — METHOCARBAMOL 500 MG PO TABS
500.0000 mg | ORAL_TABLET | Freq: Three times a day (TID) | ORAL | 0 refills | Status: DC
Start: 2017-06-30 — End: 2021-12-22

## 2017-06-30 MED ORDER — DOCUSATE SODIUM 100 MG PO CAPS
100.0000 mg | ORAL_CAPSULE | Freq: Two times a day (BID) | ORAL | Status: DC
  Filled 2017-06-30: qty 1

## 2017-06-30 MED ORDER — SUGAMMADEX SODIUM 200 MG/2ML IV SOLN
INTRAVENOUS | Status: AC
  Filled 2017-06-30: qty 2

## 2017-06-30 MED ORDER — HYDROMORPHONE HCL 2 MG PO TABS
2.0000 mg | ORAL_TABLET | ORAL | Status: DC | PRN
  Administered 2017-06-30: 2 mg via ORAL
  Filled 2017-06-30: qty 1

## 2017-06-30 MED ORDER — MENTHOL 3 MG MT LOZG: 1.0000 | LOZENGE | OROMUCOSAL | Status: DC | PRN

## 2017-06-30 MED ORDER — PROPOFOL 10 MG/ML IV BOLUS
INTRAVENOUS | Status: AC
  Filled 2017-06-30: qty 40

## 2017-06-30 MED ORDER — LACTATED RINGERS IV SOLN
INTRAVENOUS | Status: DC | PRN
  Administered 2017-06-30 (×2): via INTRAVENOUS

## 2017-06-30 MED ORDER — ONDANSETRON HCL 4 MG/2ML IJ SOLN
INTRAMUSCULAR | Status: AC
  Filled 2017-06-30: qty 2

## 2017-06-30 MED ORDER — ONDANSETRON HCL 4 MG PO TABS: 4.0000 mg | ORAL_TABLET | Freq: Four times a day (QID) | ORAL | Status: DC | PRN

## 2017-06-30 MED ORDER — PROPOFOL 10 MG/ML IV BOLUS
INTRAVENOUS | Status: DC | PRN
  Administered 2017-06-30: 150 mg via INTRAVENOUS
  Administered 2017-06-30: 30 mg via INTRAVENOUS

## 2017-06-30 MED ORDER — THROMBIN (RECOMBINANT) 5000 UNITS EX SOLR
CUTANEOUS | Status: AC
  Filled 2017-06-30: qty 5000

## 2017-06-30 MED ORDER — ROCURONIUM BROMIDE 10 MG/ML (PF) SYRINGE
PREFILLED_SYRINGE | INTRAVENOUS | Status: AC
  Filled 2017-06-30: qty 5

## 2017-06-30 MED ORDER — HYDROMORPHONE HCL 1 MG/ML IJ SOLN
0.2500 mg | INTRAMUSCULAR | Status: DC | PRN
  Administered 2017-06-30: 0.5 mg via INTRAVENOUS

## 2017-06-30 MED ORDER — ACETAMINOPHEN 650 MG RE SUPP: 650.0000 mg | RECTAL | Status: DC | PRN

## 2017-06-30 MED ORDER — CEFAZOLIN SODIUM-DEXTROSE 2-4 GM/100ML-% IV SOLN
2.0000 g | INTRAVENOUS | Status: AC
Start: 2017-06-30 — End: 2017-06-30
  Administered 2017-06-30: 2 g via INTRAVENOUS
  Filled 2017-06-30: qty 100

## 2017-06-30 MED ORDER — CEFAZOLIN SODIUM-DEXTROSE 2-4 GM/100ML-% IV SOLN
2.0000 g | Freq: Three times a day (TID) | INTRAVENOUS | Status: AC
  Administered 2017-06-30 (×2): 2 g via INTRAVENOUS
  Filled 2017-06-30 (×2): qty 100

## 2017-06-30 MED ORDER — DEXAMETHASONE SODIUM PHOSPHATE 10 MG/ML IJ SOLN
INTRAMUSCULAR | Status: DC | PRN
  Administered 2017-06-30: 10 mg via INTRAVENOUS

## 2017-06-30 MED ORDER — SODIUM CHLORIDE 0.9% FLUSH: 3.0000 mL | INTRAVENOUS | Status: DC | PRN

## 2017-06-30 MED ORDER — PANTOPRAZOLE SODIUM 20 MG PO TBEC: 20.0000 mg | DELAYED_RELEASE_TABLET | Freq: Every day | ORAL | Status: DC

## 2017-06-30 MED ORDER — POLYETHYLENE GLYCOL 3350 17 G PO PACK: 17.0000 g | PACK | Freq: Every day | ORAL | Status: DC | PRN

## 2017-06-30 MED ORDER — HYDROMORPHONE HCL 2 MG PO TABS: 2.0000 mg | ORAL_TABLET | ORAL | 0 refills | Status: AC | PRN

## 2017-06-30 MED ORDER — METHOCARBAMOL 1000 MG/10ML IJ SOLN: 500.0000 mg | Freq: Four times a day (QID) | INTRAVENOUS | Status: DC | PRN

## 2017-06-30 MED ORDER — ONDANSETRON HCL 4 MG/2ML IJ SOLN
4.0000 mg | Freq: Four times a day (QID) | INTRAMUSCULAR | Status: DC | PRN
  Administered 2017-06-30: 4 mg via INTRAVENOUS
  Filled 2017-06-30: qty 2

## 2017-06-30 MED ORDER — PROMETHAZINE HCL 25 MG/ML IJ SOLN: 6.2500 mg | INTRAMUSCULAR | Status: DC | PRN

## 2017-06-30 MED ORDER — MAGNESIUM CITRATE PO SOLN: 1.0000 | Freq: Once | ORAL | Status: DC | PRN

## 2017-06-30 MED ORDER — MIDAZOLAM HCL 2 MG/2ML IJ SOLN
INTRAMUSCULAR | Status: AC
  Filled 2017-06-30: qty 2

## 2017-06-30 MED ORDER — ACETAMINOPHEN 325 MG PO TABS
650.0000 mg | ORAL_TABLET | ORAL | Status: DC | PRN
  Administered 2017-06-30 – 2017-07-01 (×2): 650 mg via ORAL
  Filled 2017-06-30 (×2): qty 2

## 2017-06-30 MED ORDER — BUPIVACAINE-EPINEPHRINE (PF) 0.25% -1:200000 IJ SOLN
INTRAMUSCULAR | Status: AC
  Filled 2017-06-30: qty 30

## 2017-06-30 MED ORDER — 0.9 % SODIUM CHLORIDE (POUR BTL) OPTIME
TOPICAL | Status: DC | PRN
  Administered 2017-06-30: 1000 mL

## 2017-06-30 MED ORDER — FENTANYL CITRATE (PF) 100 MCG/2ML IJ SOLN
INTRAMUSCULAR | Status: DC | PRN
  Administered 2017-06-30 (×5): 50 ug via INTRAVENOUS

## 2017-06-30 MED ORDER — ONDANSETRON 4 MG PO TBDP: 4.0000 mg | ORAL_TABLET | Freq: Three times a day (TID) | ORAL | 0 refills | Status: DC | PRN

## 2017-06-30 MED ORDER — ONDANSETRON HCL 4 MG/2ML IJ SOLN
INTRAMUSCULAR | Status: DC | PRN
  Administered 2017-06-30: 4 mg via INTRAVENOUS

## 2017-06-30 MED ORDER — FENTANYL CITRATE (PF) 250 MCG/5ML IJ SOLN
INTRAMUSCULAR | Status: AC
  Filled 2017-06-30: qty 5

## 2017-06-30 MED ORDER — HYDROMORPHONE HCL 1 MG/ML IJ SOLN: 1.0000 mg | INTRAMUSCULAR | Status: DC | PRN

## 2017-06-30 MED ORDER — METHOCARBAMOL 500 MG PO TABS
500.0000 mg | ORAL_TABLET | Freq: Four times a day (QID) | ORAL | Status: DC | PRN
Start: 2017-06-30 — End: 2017-07-01
  Administered 2017-06-30: 500 mg via ORAL
  Filled 2017-06-30: qty 1

## 2017-06-30 MED ORDER — EPHEDRINE SULFATE 50 MG/ML IJ SOLN
INTRAMUSCULAR | Status: AC
Start: 2017-06-30 — End: ?
  Filled 2017-06-30: qty 1

## 2017-06-30 MED ORDER — MIDAZOLAM HCL 5 MG/5ML IJ SOLN
INTRAMUSCULAR | Status: DC | PRN
  Administered 2017-06-30: 2 mg via INTRAVENOUS

## 2017-06-30 MED ORDER — SUGAMMADEX SODIUM 200 MG/2ML IV SOLN
INTRAVENOUS | Status: DC | PRN
  Administered 2017-06-30: 200 mg via INTRAVENOUS

## 2017-06-30 MED ORDER — HYDROMORPHONE HCL 1 MG/ML IJ SOLN
INTRAMUSCULAR | Status: AC
Start: 2017-06-30 — End: 2017-06-30
  Filled 2017-06-30: qty 1

## 2017-06-30 MED ORDER — BUPIVACAINE-EPINEPHRINE 0.25% -1:200000 IJ SOLN
INTRAMUSCULAR | Status: DC | PRN
  Administered 2017-06-30: 5 mL

## 2017-06-30 SURGICAL SUPPLY — 63 items
BLADE CLIPPER SURG (BLADE) IMPLANT
BONE VIVIGEN FORMABLE 1.3CC (Bone Implant) ×3 IMPLANT
CANISTER SUCT 3000ML PPV (MISCELLANEOUS) ×3 IMPLANT
CLOSURE STERI-STRIP 1/2X4 (GAUZE/BANDAGES/DRESSINGS) ×1
CLSR STERI-STRIP ANTIMIC 1/2X4 (GAUZE/BANDAGES/DRESSINGS) ×2 IMPLANT
CORDS BIPOLAR (ELECTRODE) ×3 IMPLANT
COVER SURGICAL LIGHT HANDLE (MISCELLANEOUS) ×4 IMPLANT
CRADLE DONUT ADULT HEAD (MISCELLANEOUS) ×3 IMPLANT
DEVICE EDNSKLTN TC NNLCK MED 8 (Cage) IMPLANT
DRAPE C-ARM 42X72 X-RAY (DRAPES) ×3 IMPLANT
DRAPE POUCH INSTRU U-SHP 10X18 (DRAPES) ×3 IMPLANT
DRAPE SURG 17X23 STRL (DRAPES) ×3 IMPLANT
DRAPE U-SHAPE 47X51 STRL (DRAPES) ×3 IMPLANT
DRSG AQUACEL AG ADV 3.5X 4 (GAUZE/BANDAGES/DRESSINGS) ×3 IMPLANT
DRSG OPSITE POSTOP 3X4 (GAUZE/BANDAGES/DRESSINGS) ×2 IMPLANT
DRSG OPSITE POSTOP 4X6 (GAUZE/BANDAGES/DRESSINGS) ×3 IMPLANT
DURAPREP 26ML APPLICATOR (WOUND CARE) ×3 IMPLANT
ELECT COATED BLADE 2.86 ST (ELECTRODE) ×3 IMPLANT
ELECT PENCIL ROCKER SW 15FT (MISCELLANEOUS) ×3 IMPLANT
ELECT REM PT RETURN 9FT ADLT (ELECTROSURGICAL) ×3
ELECTRODE REM PT RTRN 9FT ADLT (ELECTROSURGICAL) ×1 IMPLANT
ENDOSKELTON IMPLANT TC MED 8MM (Cage) ×3 IMPLANT
GLOVE BIO SURGEON STRL SZ 6.5 (GLOVE) ×2 IMPLANT
GLOVE BIO SURGEONS STRL SZ 6.5 (GLOVE) ×1
GLOVE BIOGEL PI IND STRL 6.5 (GLOVE) ×1 IMPLANT
GLOVE BIOGEL PI IND STRL 8.5 (GLOVE) ×1 IMPLANT
GLOVE BIOGEL PI INDICATOR 6.5 (GLOVE) ×2
GLOVE BIOGEL PI INDICATOR 8.5 (GLOVE) ×2
GLOVE SS BIOGEL STRL SZ 8.5 (GLOVE) ×1 IMPLANT
GLOVE SUPERSENSE BIOGEL SZ 8.5 (GLOVE) ×2
GOWN STRL REUS W/ TWL LRG LVL3 (GOWN DISPOSABLE) ×1 IMPLANT
GOWN STRL REUS W/TWL 2XL LVL3 (GOWN DISPOSABLE) ×6 IMPLANT
GOWN STRL REUS W/TWL LRG LVL3 (GOWN DISPOSABLE) ×3
GRAFT BNE MATRIX VG FRMBL SM 1 (Bone Implant) IMPLANT
KIT BASIN OR (CUSTOM PROCEDURE TRAY) ×3 IMPLANT
KIT ROOM TURNOVER OR (KITS) ×3 IMPLANT
NDL SPNL 18GX3.5 QUINCKE PK (NEEDLE) ×1 IMPLANT
NEEDLE HYPO 22GX1.5 SAFETY (NEEDLE) ×3 IMPLANT
NEEDLE SPNL 18GX3.5 QUINCKE PK (NEEDLE) ×3 IMPLANT
NS IRRIG 1000ML POUR BTL (IV SOLUTION) ×3 IMPLANT
PACK ORTHO CERVICAL (CUSTOM PROCEDURE TRAY) ×3 IMPLANT
PACK UNIVERSAL I (CUSTOM PROCEDURE TRAY) ×3 IMPLANT
PAD ARMBOARD 7.5X6 YLW CONV (MISCELLANEOUS) ×6 IMPLANT
PATTIES SURGICAL .25X.25 (GAUZE/BANDAGES/DRESSINGS) IMPLANT
PIN DISTRACTION 14 (PIN) ×4 IMPLANT
PLATE SKYLINE 12MM (Plate) ×2 IMPLANT
RESTRAINT LIMB HOLDER UNIV (RESTRAINTS) ×3 IMPLANT
SCREW SKYLINE 14MM SD-VA (Screw) ×8 IMPLANT
SPONGE INTESTINAL PEANUT (DISPOSABLE) ×3 IMPLANT
SPONGE SURGIFOAM ABS GEL 100 (HEMOSTASIS) ×5 IMPLANT
SURGIFLO W/THROMBIN 8M KIT (HEMOSTASIS) ×2 IMPLANT
SUT BONE WAX W31G (SUTURE) ×3 IMPLANT
SUT MON AB 3-0 SH 27 (SUTURE) ×3
SUT MON AB 3-0 SH27 (SUTURE) ×1 IMPLANT
SUT SILK 3 0 TIES 10X30 (SUTURE) ×2 IMPLANT
SUT VIC AB 2-0 CT1 18 (SUTURE) ×3 IMPLANT
SYR BULB IRRIGATION 50ML (SYRINGE) ×3 IMPLANT
SYR CONTROL 10ML LL (SYRINGE) ×3 IMPLANT
TAPE CLOTH 4X10 WHT NS (GAUZE/BANDAGES/DRESSINGS) ×3 IMPLANT
TAPE UMBILICAL COTTON 1/8X30 (MISCELLANEOUS) ×3 IMPLANT
TOWEL OR 17X24 6PK STRL BLUE (TOWEL DISPOSABLE) ×3 IMPLANT
TOWEL OR 17X26 10 PK STRL BLUE (TOWEL DISPOSABLE) ×3 IMPLANT
WATER STERILE IRR 1000ML POUR (IV SOLUTION) ×1 IMPLANT

## 2017-06-30 NOTE — H&P (Signed)
History of Present Illness  The patient is a 56 year old male who comes in today for a preoperative History and Physical. The patient is scheduled for a ACDF C5-6 to be performed by Dr. Debria Garretahari D. Shon BatonBrooks, MD at [facility] on 06/30/17 . Pt reports hx of good health.  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 Children  2 Current drinker  10/13/2015: Currently drinks beer 5-7 times per week Current work status  working full time Exercise  Exercises daily; does running / walking and gym / [employer] Living situation  live with spouse Marital status  married No history of drug/alcohol rehab  Not under pain contract  Number of flights of stairs before winded  greater than 5  Medication History Aspirin (81MG  Tablet Chewable, Oral) Active. Multivitamin Men (Oral) Active. Medications Reconciled  Vitals  06/17/2017 10:23 AM Weight: 191 lb Height: 70in Body Surface Area: 2.05 m Body Mass Index: 27.41 kg/m  Temp.: 98.74F  Pulse: 72 (Regular)  BP: 173/99 (Sitting, Right Arm, Standard)  General General Appearance-Not in acute distress. Orientation-Oriented X3. Build & Nutrition-Well nourished and Well developed.  Integumentary General  Characteristics Surgical Scars - no surgical scar evidence of previous cervical surgery. Cervical Spine-Skin examination of the cervical spine is without deformity, skin lesions, lacerations or abrasions.  Chest and Lung Exam Auscultation Breath sounds - Normal and Clear.  Cardiovascular Auscultation Rhythm - Regular rate and rhythm.  Peripheral Vascular Upper Extremity Palpation - Radial pulse - Bilateral - 2+.  Neurologic Sensation Upper Extremity - Left - sensation is diminished in the upper extremity. Right - sensation is intact in the upper extremity. Reflexes Biceps Reflex - Bilateral - 2+. Brachioradialis Reflex - Bilateral - 2+. Triceps Reflex - Bilateral - 2+. Hoffman's Sign - Bilateral - Hoffman's sign not present. Note: positive Lhermitte sign positive Spurling's to the left   Musculoskeletal Spine/Ribs/Pelvis  Cervical Spine : Inspection and Palpation - Tenderness - right cervical paraspinals tender to palpation, left cervical paraspinals tender to palpation and left trapezius tender to palpation. Strength and Tone: Strength - Deltoid - Bilateral - 5/5. Biceps - Bilateral - 5/5. Triceps - Bilateral - 5/5. Wrist Extension - Bilateral - 5/5. Hand Grip - Bilateral - 5/5. Heel walk - Bilateral - able to heel walk without difficulty. Toe Walk - Bilateral - able to walk on toes without difficulty. Heel-Toe Walk - Bilateral - able to heel-toe walk without difficulty. ROM - Flexion - Moderately Decreased and painful. Extension - Moderately Decreased and painful. Left Lateral Flexion - Moderately Decreased and painful. Right Lateral Flexion - Moderately Decreased and painful. Left Rotation - Moderately Decreased and painful. Right Rotation - Moderately Decreased and painful. Pain - neither flexion or extension is more painful than the other. Cervical Spine - Special Testing - axial compression test negative, cross chest impingement test negative. Non-Anatomic Signs -  No non-anatomic  signs present. Upper Extremity Range of Motion - No truesholder pain with IR/ER of the shoulders.  MRI is significant for minimal retrolisthesis of C5 on C6 moderate disc height loss moderate bulging of the disc osteophyte complex moderate right severe left osteophytosis mild facet arthrosis minimal flattening of the cord without central stenosis there is moderate to severe left neuroforaminal narrowing.  Assessment & Plan Anterior cervical fusion:Risks of surgery include, but are not limited to: Throat pain, swallowing difficulty, hoarseness or change in voice, death, stroke, paralysis, nerve root damage/injury, bleeding, blood clots, loss of bowel/bladder control, hardware failure, or mal-position, spinal fluid leak, adjacent segment disease, non-union, need for further surgery, ongoing or worse pain, infection. Post-operative bleeding or swelling that could require emergent surgery.  Goal Of Surgery: Discussed that goal of surgery is to reduce pain and improve function and quality of life. Patient is aware that despite all appropriate treatment that there pain and function could be the same, worse, or different.  Despite appropriate conservative management the patient continues to have significant pain and dysesthesias affecting the left arm in the C6 dermatome.  We have discussed treatment options and elected to proceed with an ACDF at C5-6.  I again went over the risks and benefits with the patient and his wife this morning.  There is been no significant change from his last office visit.  If the patient is doing well this afternoon, tolerating regular diet, ambulating, pain controlled with oral medications then I will allow him to be discharged to home.

## 2017-06-30 NOTE — Transfer of Care (Signed)
Immediate Anesthesia Transfer of Care Note  Patient: Vic BlackbirdMark B Halseth  Procedure(s) Performed: ANTERIOR CERVICAL DECOMPRESSION/DISCECTOMY FUSION C5-6 (N/A )  Patient Location: PACU  Anesthesia Type:General  Level of Consciousness: awake, alert , oriented and sedated  Airway & Oxygen Therapy: Patient Spontanous Breathing and Patient connected to nasal cannula oxygen  Post-op Assessment: Report given to RN, Post -op Vital signs reviewed and stable and Patient moving all extremities  Post vital signs: Reviewed and stable  Last Vitals:  Vitals:   06/30/17 0613 06/30/17 1003  BP: (!) 173/106   Pulse:    Resp:    Temp:  (!) (P) 36.2 C  SpO2:      Last Pain:  Vitals:   06/30/17 0543  TempSrc: Oral      Patients Stated Pain Goal: 3 (06/30/17 [id number])  Complications: No apparent anesthesia complications

## 2017-06-30 NOTE — Evaluation (Signed)
Physical Therapy Evaluation Patient Details Name: Chase Lowe MRN: [id number] DOB: [birth year] Today's Date: 06/30/2017   History of Present Illness  Patient is a 56 yo male s/p ACDF C5-6.  Clinical Impression  Patient seen for mobility assessment s/p cervical spine surgery. Mobilizing well. Educated patient on precautions, mobility expectations, safety and car transfers. No further acute PT needs. Will sign off.     Follow Up Recommendations No PT follow up    Equipment Recommendations  None recommended by PT    Recommendations for Other Services       Precautions / Restrictions Precautions Precautions: Cervical Precaution Comments: handout provided Required Braces or Orthoses: Cervical Brace Cervical Brace: Hard collar      Mobility  Bed Mobility Overal bed mobility: Modified Independent             General bed mobility comments: increased time to perform  Transfers Overall transfer level: Needs assistance   Transfers: Sit to/from Stand Sit to Stand: Supervision         General transfer comment: supervision for safety/line management. No physical assist required  Ambulation/Gait Ambulation/Gait assistance: Supervision Ambulation Distance (Feet): 210 Feet Assistive device: (pushing IV pole) Gait Pattern/deviations: WFL(Within Functional Limits) Gait velocity: decreased   General Gait Details: slow but steady with ambulation  Stairs Stairs: Yes Stairs assistance: Supervision Stair Management: One rail Right Number of Stairs: 4 General stair comments: no difficulty, VCs for blind negotiation  Wheelchair Mobility    Modified Rankin (Stroke Patients Only)       Balance Overall balance assessment: No apparent balance deficits (not formally assessed)                                           Pertinent Vitals/Pain Pain Assessment: Faces Faces Pain Scale: Hurts little more Pain Location: surgical site, throat Pain  Descriptors / Indicators: Sore;Operative site guarding Pain Intervention(s): Monitored during session    Home Living Family/patient expects to be discharged to:: Private residence Living Arrangements: Spouse/significant other Available Help at Discharge: Family Type of Home: House Home Access: Stairs to enter Entrance Stairs-Rails: Can reach both Secretary/administratorntrance Stairs-Number of Steps: 8 Home Layout: One level Home Equipment: None      Prior Function Level of Independence: Independent               Hand Dominance   Dominant Hand: Right    Extremity/Trunk Assessment   Upper Extremity Assessment Upper Extremity Assessment: Defer to OT evaluation    Lower Extremity Assessment Lower Extremity Assessment: Overall WFL for tasks assessed    Cervical / Trunk Assessment Cervical / Trunk Assessment: (s/p cervical surgery)  Communication   Communication: No difficulties  Cognition Arousal/Alertness: Awake/alert Behavior During Therapy: WFL for tasks assessed/performed Overall Cognitive Status: Within Functional Limits for tasks assessed                                        General Comments General comments (skin integrity, edema, etc.): educated on precautions and mobility expectations    Exercises     Assessment/Plan    PT Assessment Patent does not need any further PT services  PT Problem List         PT Treatment Interventions      PT Goals (Current goals can be found  in the Care Plan section)  Acute Rehab PT Goals PT Goal Formulation: All assessment and education complete, DC therapy    Frequency     Barriers to discharge        Co-evaluation               AM-PAC PT "6 Clicks" Daily Activity  Outcome Measure Difficulty turning over in bed (including adjusting bedclothes, sheets and blankets)?: None Difficulty moving from lying on back to sitting on the side of the bed? : A Little Difficulty sitting down on and standing up from a  chair with arms (e.g., wheelchair, bedside commode, etc,.)?: A Little Help needed moving to and from a bed to chair (including a wheelchair)?: None Help needed walking in hospital room?: A Little Help needed climbing 3-5 steps with a railing? : A Little 6 Click Score: 20    End of Session Equipment Utilized During Treatment: Cervical collar Activity Tolerance: Patient tolerated treatment well;Other (comment)(Nausea/vomitting ) Patient left: in bed;with call bell/phone within reach;with family/visitor present(sitting EOB) Nurse Communication: Mobility status PT Visit Diagnosis: Pain Pain - part of body: (neck)    Time: [id number] PT Time Calculation (min) (ACUTE ONLY): 20 min   Charges:   PT Evaluation $PT Eval Low Complexity: 1 Low     PT G Codes:   PT G-Codes **NOT FOR INPATIENT CLASS** Functional Assessment Tool Used: Clinical judgement Functional Limitation: Mobility: Walking and moving around Mobility: Walking and Moving Around Current Status ([id number]([id number]): At least 1 percent but less than 20 percent impaired, limited or restricted Mobility: Walking and Moving Around Goal Status [phone](G8979): At least 1 percent but less than 20 percent impaired, limited or restricted Mobility: Walking and Moving Around Discharge Status [phone](G8980): At least 1 percent but less than 20 percent impaired, limited or restricted    Charlotte Crumbevon Tychelle Purkey, PT DPT  Board Certified Neurologic Specialist [phone]   Fabio AsaDevon J Shley Dolby 06/30/2017, 4:42 PM

## 2017-06-30 NOTE — Anesthesia Procedure Notes (Signed)
Procedure Name: Intubation Date/Time: 06/30/2017 7:48 AM Performed by: Fransisca KaufmannMeyer, Cledith Kamiya E, CRNA Pre-anesthesia Checklist: Patient identified, Emergency Drugs available, Suction available and Patient being monitored Patient Re-evaluated:Patient Re-evaluated prior to induction Oxygen Delivery Method: Circle System Utilized Preoxygenation: Pre-oxygenation with 100% oxygen Induction Type: IV induction Ventilation: Mask ventilation without difficulty Laryngoscope Size: Glidescope and 3 Grade View: Grade III Tube type: Oral Tube size: 8.0 mm Number of attempts: 1 Airway Equipment and Method: Stylet,  Oral airway,  Video-laryngoscopy and Rigid stylet Placement Confirmation: ETT inserted through vocal cords under direct vision,  positive ETCO2 and breath sounds checked- equal and bilateral Secured at: 24 cm Tube secured with: Tape Dental Injury: Teeth and Oropharynx as per pre-operative assessment  Difficulty Due To: Difficulty was anticipated, Difficult Airway- due to reduced neck mobility, Difficult Airway- due to anterior larynx and Difficult Airway- due to limited oral opening

## 2017-06-30 NOTE — Anesthesia Postprocedure Evaluation (Signed)
Anesthesia Post Note  Patient: Chase Lowe  Procedure(s) Performed: ANTERIOR CERVICAL DECOMPRESSION/DISCECTOMY FUSION C5-6 (N/A )     Patient location during evaluation: PACU Anesthesia Type: General Level of consciousness: awake and alert Pain management: pain level controlled Vital Signs Assessment: post-procedure vital signs reviewed and stable Respiratory status: spontaneous breathing, nonlabored ventilation, respiratory function stable and patient connected to nasal cannula oxygen Cardiovascular status: blood pressure returned to baseline and stable Postop Assessment: no apparent nausea or vomiting Anesthetic complications: no    Last Vitals:  Vitals:   06/30/17 1100 06/30/17 1115  BP:  (!) 165/102  Pulse:  95  Resp:  20  Temp: 36.5 C 36.5 C  SpO2:  95%    Last Pain:  Vitals:   06/30/17 1115  TempSrc:   PainSc: 2                  Ryan P Ellender

## 2017-06-30 NOTE — Op Note (Signed)
Operative report.  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  Postoperative diagnosis.  Same.  Operative procedure.  ACDF C5-6.  Complications.  None.  First Geophysicist/field seismologistassistant.  [place]en Mayo, [place].  Implant system: Titan Nano lock lordotic intervertebral cage 8 mm medium.  Allograft:vivogen. Depuy anterior cervical skyline plate.  12 mm length.  14 mm locking screws x4.  Indications.  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  Patient has tried and failed conservative management and continues to suffer.  As a result he elected to proceed with the ACDF at C5-6 to alleviate his pain and improve his quality of life.  All appropriate risks benefits and alternatives were discussed with the patient and consent was obtained.  Operative note.  Patient was brought the operating room placed upon the operating room table.  After successful induction of general anesthesia and endotracheal intubation teds SCDs were applied and the anterior cervical spine was prepped and draped in a standard fashion.  Timeout was taken to confirm patient procedure and all other important data.  X-ray was used to identify the C5-6 disc space and the skin was marked out for the incision.  I infiltrated this with quarter percent Marcaine with epinephrine.  Transverse incision was made starting in the midline and going to the left.  Sharp dissection was carried out down to the platysma.  The platysma was sharply incised and I continued my sharp dissection performing a standard Smith-Robinson approach to the anterior cervical spine.  Dissecting along the medial border the sternocleidomastoid I slept the omohyoid to the right and then ultimately identified the trachea in the esophagus.  I swept this to the right with my finger and then could palpate the anterior longitudinal ligament.  Using Kitner  dissectors I mobilized the remaining prevertebral fascia completely exposed the 5/6 disc space.  A needle was placed into the disc and an x-ray was taken to confirm I was at the appropriate level.  Bipolar cautery was then used to mobilize the longus coli muscles from the to the mid body of C6 bilaterally.  I then placed my self-retaining shadow line retractor blades in underneath the longus coli muscle.  The endotracheal cuff was deflated and a expanded the retractor to the appropriate with and reinflated the endotracheal cuff.  An annulotomy was then performed with a 15 blade scalpel and I used pituitary rongeur to remove the bulk of the disc material.  Then used a 2 mm Kerrison punch to excise the overhanging osteophyte from the inferior aspect of the C5 vertebral body.  Distraction pins were then placed into the C5 and C6 vertebral body and I expanded the intervertebral space with a lamina spreader.  I maintain this distraction using my distraction pin set.  I then used my neuro curettes to continue my discectomy.  I ultimately released the posterior annulus and use my 1 mm Kerrison rongeur to take down the posterior osteophyte from the body of C5 and C6.  I then used my fine nerve hook to develop a plane through the remainder of the posterior annulus and posterior longitudinal ligament.  Once this was done I was able to exploit this plane using my 1 mm Kerrison rongeur and resect the PLL.  This allowed me to get under the uncovertebral joint bilaterally and decompress this area.  Particular attention was paid to the left side is this where he  had severe foraminal stenosis.  At this point I could easily pass my nerve hook underneath the vertebral body and an adequate decompression and discectomy.  At this point I then rasped the endplates with my trial devices remove any of the remaining cartilaginous material.  This point had bleeding subchondral bone.  After trialing elected to use a size 8 mm lordotic  cage.  This was obtained packed with the allograft and malleted into the appropriate depth.  X-rays confirm satisfactory position of the intervertebral cage.  At this point I removed the distraction pins placed bone wax and the holes to maintain hemostasis.  A 12 mm anterior cervical plate was then obtained and affixed to the bodies of C5 and C6 with 14 mm self drilling screws.  All screws had excellent purchase.  Final x-rays were taken which are satisfactory.  Hardware and intervertebral cage are in good position no comp gating features.  I then locked the screws to the plate in accordance with manufacturer standards.  The wound was copiously irrigated with normal saline and using bipolar cautery and FloSeal I obtained and maintain hemostasis.  Final irrigation was done and the esophagus was returned to midline.  I then closed the platysma with interrupted 2-0 Vicryl sutures and the skin with a 3-0 Monocryl.  Steri-Strips dry dressing were applied and the patient was ultimately extubated transferred the PACU without incident.  The end of the case all needle sponge counts were correct there were no adverse intraoperative events

## 2017-06-30 NOTE — Brief Op Note (Signed)
06/30/2017  9:53 AM  PATIENT:  Vic BlackbirdMark B Imperato  56 y.o. male  PRE-OPERATIVE DIAGNOSIS:  C5-6 Left Cervical radiculopathy  POST-OPERATIVE DIAGNOSIS:  C5-6 Left Cervical radiculopathy  PROCEDURE:  Procedure(s) with comments: ANTERIOR CERVICAL DECOMPRESSION/DISCECTOMY FUSION C5-6 (N/A) - 3 hrs  SURGEON:  Surgeon(s) and Role:    Venita Lick* Rahi Chandonnet, MD - Primary  PHYSICIAN ASSISTANT:   ASSISTANTS: Carmen Mayo   ANESTHESIA:   general  EBL:  minimal  BLOOD ADMINISTERED:none  DRAINS: none   LOCAL MEDICATIONS USED:  MARCAINE     SPECIMEN:  No Specimen  DISPOSITION OF SPECIMEN:  N/A  COUNTS:  YES  TOURNIQUET:  * No tourniquets in log *  DICTATION: .Dragon Dictation  PLAN OF CARE: Admit for overnight observation  PATIENT DISPOSITION:  PACU - hemodynamically stable.

## 2017-07-01 ENCOUNTER — Other Ambulatory Visit: Payer: Self-pay

## 2017-07-01 ENCOUNTER — Encounter (HOSPITAL_COMMUNITY): Payer: Self-pay | Admitting: Orthopedic Surgery

## 2017-07-01 DIAGNOSIS — M50122 Cervical disc disorder at C5-C6 level with radiculopathy: Secondary | ICD-10-CM | POA: Diagnosis not present

## 2017-07-01 NOTE — Evaluation (Signed)
Occupational Therapy Evaluation Patient Details Name: Chase Lowe MRN: [id number] DOB: [birth year] Today's Date: 07/01/2017    History of Present Illness Patient is a 56 yo male s/p ACDF C5-6.   Clinical Impression   Pt reports he was independent with ADL PTA. Currently pt supervision with ADL and functional mobility. All neck, safety, and ADL education completed with pt. Pt planning to d/c home with 24/7 supervision from family. No further acute OT needs identified; signing off at this time. Please re-consult if needs change. Thank you for this referral.     Follow Up Recommendations  No OT follow up;Supervision - Intermittent    Equipment Recommendations  None recommended by OT    Recommendations for Other Services       Precautions / Restrictions Precautions Precautions: Cervical Precaution Comments: Reviewed precautions with pt and wife Required Braces or Orthoses: Cervical Brace Cervical Brace: Hard collar Restrictions Weight Bearing Restrictions: No      Mobility Bed Mobility Overal bed mobility: Modified Independent             General bed mobility comments: HOB elevated  Transfers Overall transfer level: Needs assistance Equipment used: None Transfers: Sit to/from Stand Sit to Stand: Supervision         General transfer comment: for safety    Balance Overall balance assessment: No apparent balance deficits (not formally assessed)                                         ADL either performed or assessed with clinical judgement   ADL Overall ADL's : Needs assistance/impaired Eating/Feeding: Set up;Sitting   Grooming: Supervision/safety;Standing Grooming Details (indicate cue type and reason): Educated on use of 2 cups for oral care Upper Body Bathing: Set up;Sitting   Lower Body Bathing: Supervison/ safety;Sit to/from stand   Upper Body Dressing : Set up;Sitting Upper Body Dressing Details (indicate cue type and  reason): to don shirt and brace Lower Body Dressing: Supervision/safety;Sit to/from stand Lower Body Dressing Details (indicate cue type and reason): to don pants and socks/shoes Toilet Transfer: Supervision/safety;Ambulation;Regular Teacher, adult educationToilet Toilet Transfer Details (indicate cue type and reason): Simulated     Tub/ Shower Transfer: Supervision/safety;Walk-in shower;Ambulation Tub/Shower Transfer Details (indicate cue type and reason): Educated on supervision for safety initially Functional mobility during ADLs: Supervision/safety General ADL Comments: Educated pt on maintaining cervical precautions during functional tasks, brace management and wear schedule     Vision         Perception     Praxis      Pertinent Vitals/Pain Pain Assessment: Faces Faces Pain Scale: Hurts little more Pain Location: neck Pain Descriptors / Indicators: Discomfort;Sore Pain Intervention(s): Monitored during session;Repositioned     Hand Dominance Right   Extremity/Trunk Assessment Upper Extremity Assessment Upper Extremity Assessment: Overall WFL for tasks assessed   Lower Extremity Assessment Lower Extremity Assessment: Defer to PT evaluation   Cervical / Trunk Assessment Cervical / Trunk Assessment: Other exceptions Cervical / Trunk Exceptions: s/p ACDF   Communication Communication Communication: No difficulties   Cognition Arousal/Alertness: Awake/alert Behavior During Therapy: WFL for tasks assessed/performed Overall Cognitive Status: Within Functional Limits for tasks assessed                                     General Comments  Exercises     Shoulder Instructions      Home Living Family/patient expects to be discharged to:: Private residence Living Arrangements: Spouse/significant other Available Help at Discharge: Family;Available 24 hours/day Type of Home: House Home Access: Stairs to enter Entergy CorporationEntrance Stairs-Number of Steps: 8 Entrance  Stairs-Rails: Can reach both Home Layout: One level     Bathroom Shower/Tub: Tub/shower unit;Walk-in shower   Bathroom Toilet: Standard     Home Equipment: None          Prior Functioning/Environment Level of Independence: Independent                 OT Problem List:        OT Treatment/Interventions:      OT Goals(Current goals can be found in the care plan section) Acute Rehab OT Goals Patient Stated Goal: go home OT Goal Formulation: All assessment and education complete, DC therapy  OT Frequency:     Barriers to D/C:            Co-evaluation              AM-PAC PT "6 Clicks" Daily Activity     Outcome Measure Help from another person eating meals?: None Help from another person taking care of personal grooming?: None Help from another person toileting, which includes using toliet, bedpan, or urinal?: A Little Help from another person bathing (including washing, rinsing, drying)?: A Little Help from another person to put on and taking off regular upper body clothing?: None Help from another person to put on and taking off regular lower body clothing?: A Little 6 Click Score: 21   End of Session Equipment Utilized During Treatment: Cervical collar Nurse Communication: Mobility status;Other (comment)(no equipment or f/u needs; pt ready for d/c)  Activity Tolerance: Patient tolerated treatment well Patient left: in bed;with family/visitor present(sitting EOB)  OT Visit Diagnosis: Pain Pain - part of body: (neck)                Time: [id number] OT Time Calculation (min): 11 min Charges:  OT General Charges $OT Visit: 1 Visit OT Evaluation $OT Eval Low Complexity: 1 Low G-Codes: OT G-codes **NOT FOR INPATIENT CLASS** Functional Assessment Tool Used: Clinical judgement Functional Limitation: Self care Self Care Current Status (V4098(G8987): At least 1 percent but less than 20 percent impaired, limited or restricted Self Care Goal Status (J1914(G8988): At  least 1 percent but less than 20 percent impaired, limited or restricted Self Care Discharge Status [phone](G8989): At least 1 percent but less than 20 percent impaired, limited or restricted   Fredric MareBailey A. Brett Lowe, M.S., OTR/L Pager: [phone]  Chase Lowe 07/01/2017, 8:45 AM

## 2017-07-01 NOTE — Progress Notes (Signed)
Patient is discharged from room 3C08 at this time. Alert and in stable condition. IV site d/c'[d and instructions read to patient and spouse with understanding verbalized. Left unit via wheelchair with all belongings at side. 

## 2017-07-01 NOTE — Progress Notes (Signed)
    Subjective: 1 Day Post-Op Procedure(s) (LRB): ANTERIOR CERVICAL DECOMPRESSION/DISCECTOMY FUSION C5-6 (N/A) Patient reports pain as 4 on 0-10 scale.   Denies CP or SOB.  Voiding without difficulty. Positive flatus.Reports improved arm symptoms.  Pt has been ambulating in hallway.  Pt experienced some nausea yesterday but reports it is resolved.  Objective: Vital signs in last 24 hours: Temp:  [97.2 F (36.2 C)-98.5 F (36.9 C)] 98.4 F (36.9 C) (12/28 0400) Pulse Rate:  [73-101] 87 (12/28 0400) Resp:  [10-20] 18 (12/28 0400) BP: (135-165)/(82-102) 135/96 (12/28 0400) SpO2:  [93 %-100 %] 98 % (12/28 0400)  Intake/Output from previous day: 12/27 0701 - 12/28 0700 In: 2500 [P.O.:600; I.V.:1800; IV Piggyback:100] Out: 35 [Blood:35] Intake/Output this shift: No intake/output data recorded.  Labs: No results for input(s): HGB in the last 72 hours. No results for input(s): WBC, RBC, HCT, PLT in the last 72 hours. No results for input(s): NA, K, CL, CO2, BUN, CREATININE, GLUCOSE, CALCIUM in the last 72 hours. No results for input(s): LABPT, INR in the last 72 hours.  Physical Exam: Neurologically intact ABD soft Sensation intact distally Dorsiflexion/Plantar flexion intact Incision: scant drainage Compartment soft Aspen collar in place  Assessment/Plan: 1 Day Post-Op Procedure(s) (LRB): ANTERIOR CERVICAL DECOMPRESSION/DISCECTOMY FUSION C5-6 (N/A) Advance diet Up with therapy  Pt may DC after cleared by PT  Reeve Mallo, Baxter Kailarmen Christina for Dr. Venita Lickahari Brooks [facility] [phone] 07/01/2017, 7:04 AM

## 2017-07-06 ENCOUNTER — Encounter (HOSPITAL_COMMUNITY): Payer: Self-pay | Admitting: Orthopedic Surgery

## 2017-07-06 NOTE — Discharge Summary (Signed)
Physician Discharge Summary  Patient ID: Chase Lowe Scarpelli MRN: [id number] DOB/AGE: 58-Jun-1962 58 y.o.  Admit date: 06/30/2017 Discharge date: 07/01/17  Admission Diagnoses:  Cervical DDD  Discharge Diagnoses:  Active Problems:   S/P cervical spinal fusion   Past Medical History:  Diagnosis Date  . Complication of anesthesia    during a colonoscopy pt. woke up during the procedure and also during a hand surgery   . Duodenal ulcer 1983  . GERD (gastroesophageal reflux disease)   . History of blood product transfusion     Surgeries: Procedure(s): ANTERIOR CERVICAL DECOMPRESSION/DISCECTOMY FUSION C5-6 on 06/30/2017   Consultants (if any):   Discharged Condition: Improved  Hospital Course: Chase Lowe Eckardt is an 57 y.o. male who was admitted 06/30/2017 with a diagnosis of Cervical DDD and went to the operating room on 06/30/2017 and underwent the above named procedures.  Post op day one pt reports moderate pain controlled on oral medication.  Pt reports improved radicular symptoms.  Pt denies issues with nausea and urination.  Pt is ambulating in hallway.  Pt is cleared by PT before DC.   He was given perioperative antibiotics:  Anti-infectives (From admission, onward)   Start     Dose/Rate Route Frequency Ordered Stop   06/30/17 1600  ceFAZolin (ANCEF) IVPB 2g/100 mL premix     2 g 200 mL/hr over 30 Minutes Intravenous Every 8 hours 06/30/17 1117 07/01/17 0013   06/30/17 0601  ceFAZolin (ANCEF) IVPB 2g/100 mL premix     2 g 200 mL/hr over 30 Minutes Intravenous 30 min pre-op 06/30/17 0601 06/30/17 0751    .  He was given sequential compression devices, early ambulation, and TED for DVT prophylaxis.  He benefited maximally from the hospital stay and there were no complications.    Recent vital signs:  Vitals:   06/30/17 2329 07/01/17 0400  BP: (!) 143/101 (!) 135/96  Pulse: 94 87  Resp: 20 18  Temp: 98.5 F (36.9 C) 98.4 F (36.9 C)  SpO2: 95% 98%    Recent  laboratory studies:  Lab Results  Component Value Date   HGB 14.0 06/23/2017   HGB 14.1 02/13/2016   HGB 14.0 04/24/2014   Lab Results  Component Value Date   WBC 3.8 (L) 06/23/2017   PLT 149 (L) 06/23/2017   No results found for: INR No results found for: NA, K, CL, CO2, BUN, CREATININE, GLUCOSE  Discharge Medications:   Allergies as of 07/01/2017      Reactions   Aspirin Other (See Comments)   High doses contraindicated due to ulcer   Codeine Nausea And Vomiting   Oxycodone-acetaminophen Nausea And Vomiting      Medication List    STOP taking these medications   acetaminophen 500 MG tablet Commonly known as:  TYLENOL   aspirin EC 81 MG tablet   diclofenac sodium 1 % Gel Commonly known as:  VOLTAREN   ibuprofen 200 MG tablet Commonly known as:  ADVIL,MOTRIN     TAKE these medications   lansoprazole 15 MG capsule Commonly known as:  PREVACID Take 15 mg by mouth daily as needed (for stomach).   methocarbamol 500 MG tablet Commonly known as:  ROBAXIN Take 1 tablet (500 mg total) by mouth 3 (three) times daily.   MULTIVITAMIN PO Take 1 tablet by mouth daily.   ondansetron 4 MG disintegrating tablet Commonly known as:  ZOFRAN ODT Take 1 tablet (4 mg total) by mouth every 8 (eight) hours as needed for nausea  or vomiting.     ASK your doctor about these medications   HYDROmorphone 2 MG tablet Commonly known as:  DILAUDID Take 1 tablet (2 mg total) by mouth every 4 (four) hours as needed for up to 5 days for severe pain. Ask about: Should I take this medication?       Diagnostic Studies: Dg Cervical Spine 2-3 Views  Result Date: 06/30/2017 CLINICAL DATA:  57 year old male undergoing cervical ACDF. EXAM: CERVICAL SPINE - 2-3 VIEW; DG C-ARM 61-120 MIN COMPARISON:  Cervical spine MRI 08/07/2007. FLUOROSCOPY TIME:  0 minutes 28 seconds FINDINGS: Intraoperative fluoroscopic AP and lateral spot views of the cervical spine. C5-C6 ACDF hardware in place. No  adverse features. Other visible cervical levels appear stable. IMPRESSION: C5-C6 ACDF with no adverse hardware features. Electronically Signed   By: Odessa Fleming M.D.   On: 06/30/2017 10:08   Dg C-arm 61-120 Min  Result Date: 06/30/2017 CLINICAL DATA:  57 year old male undergoing cervical ACDF. EXAM: CERVICAL SPINE - 2-3 VIEW; DG C-ARM 61-120 MIN COMPARISON:  Cervical spine MRI 08/07/2007. FLUOROSCOPY TIME:  0 minutes 28 seconds FINDINGS: Intraoperative fluoroscopic AP and lateral spot views of the cervical spine. C5-C6 ACDF hardware in place. No adverse features. Other visible cervical levels appear stable. IMPRESSION: C5-C6 ACDF with no adverse hardware features. Electronically Signed   By: Odessa Fleming M.D.   On: 06/30/2017 10:08    Disposition: 01-Home or Self Care Post op medication provided Pt will present to clinic in 2 weeks  Discharge Instructions    Incentive spirometry RT   Complete by:  As directed       Follow-up Information    Venita Lick, MD Follow up.   Specialty:  Orthopedic Surgery Contact information: [address] [place] [address] [phone]            Signed: Kirt Boys 07/06/2017, 8:58 AM

## 2017-10-29 ENCOUNTER — Other Ambulatory Visit: Payer: Self-pay | Admitting: Physician Assistant

## 2017-10-29 MED ORDER — ALBUTEROL SULFATE HFA 108 (90 BASE) MCG/ACT IN AERS: 2.0000 | INHALATION_SPRAY | Freq: Four times a day (QID) | RESPIRATORY_TRACT | 1 refills | Status: DC | PRN

## 2019-06-27 IMAGING — RF DG CERVICAL SPINE 2 OR 3 VIEWS
1 series · 2 of 2 positions shown · non-contrast
Comparison: Cervical spine MRI 08/07/2007.

FLUOROSCOPY TIME:  0 minutes 28 seconds

CLINICAL DATA: 56-year-old male undergoing cervical ACDF.

EXAM:
CERVICAL SPINE - 2-3 VIEW; DG C-ARM 61-120 MIN

[Series 1: run · 2 of 2 slices shown]
[im 1/2]
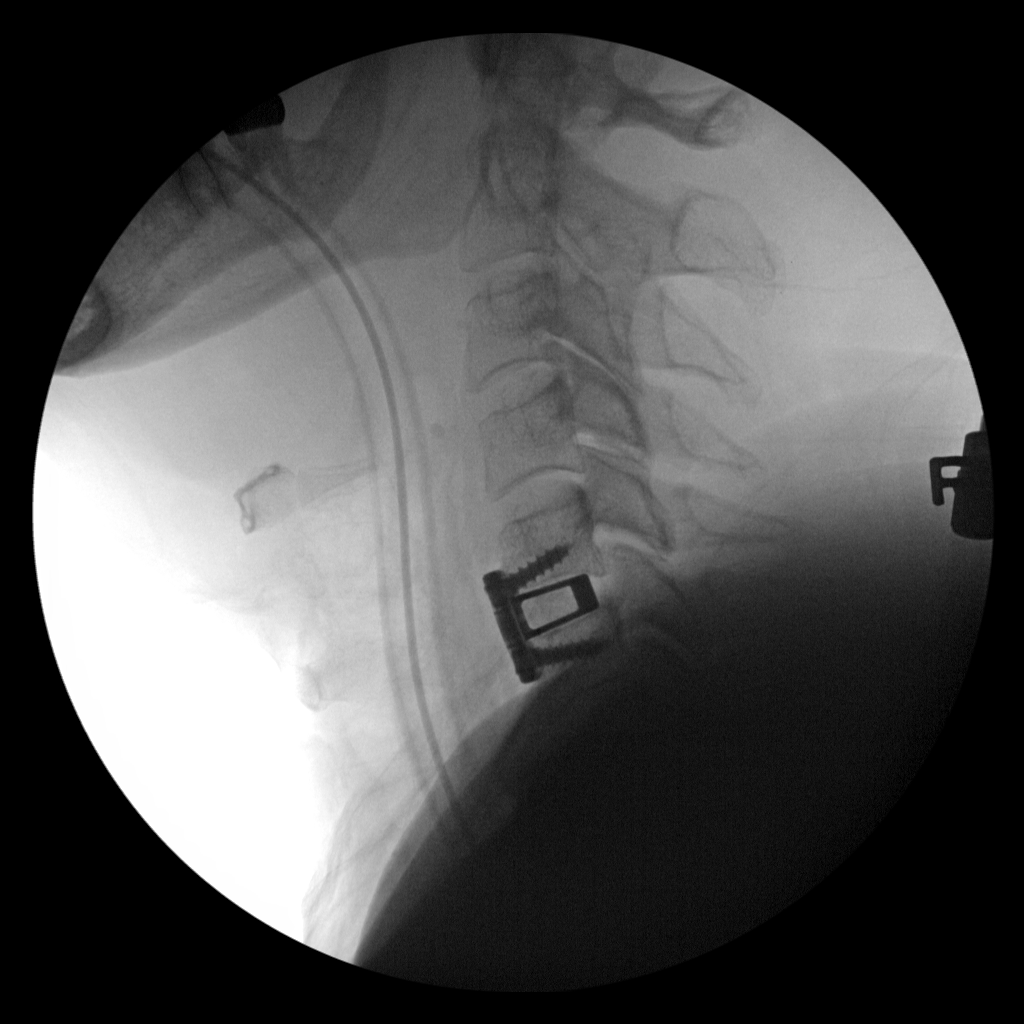
[im 2/2]
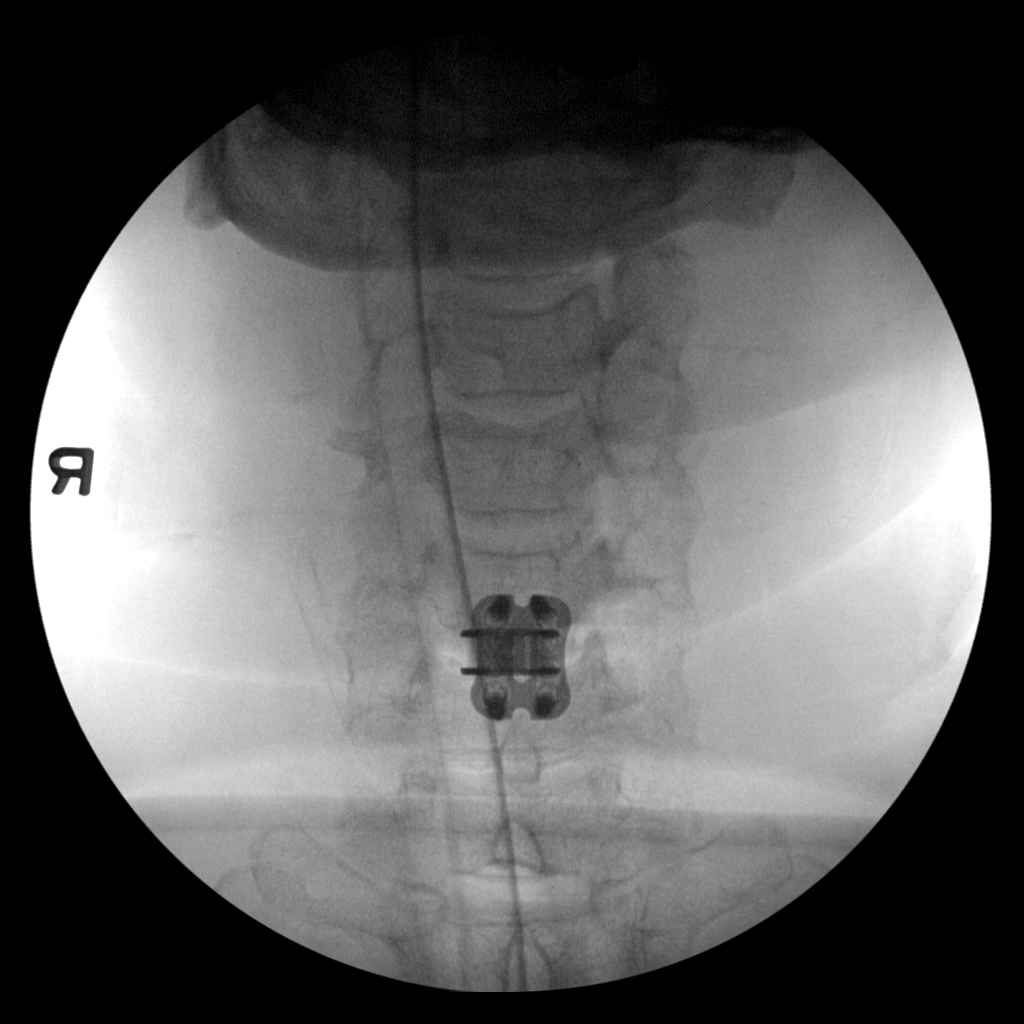

[2 of 2 positions shown; findings below may reference images not displayed]

FINDINGS: Intraoperative fluoroscopic AP and lateral spot views of the
cervical spine. C5-C6 ACDF hardware in place. No adverse features.
Other visible cervical levels appear stable.
IMPRESSION: C5-C6 ACDF with no adverse hardware features.

## 2020-07-05 HISTORY — PX: REPLACEMENT TOTAL KNEE: SUR1224

## 2021-03-04 LAB — BASIC METABOLIC PANEL
BUN: 15 (ref 4–21)
CO2: 26 — AB (ref 13–22)
Chloride: 105 (ref 99–108)
Creatinine: 1 (ref 0.6–1.3)
Glucose: 93
Potassium: 4.1 mEq/L (ref 3.5–5.1)
Sodium: 139 (ref 137–147)

## 2021-03-04 LAB — COMPREHENSIVE METABOLIC PANEL
Albumin: 4.5 (ref 3.5–5.0)
Calcium: 9.7 (ref 8.7–10.7)
eGFR: 83

## 2021-03-04 LAB — HEPATIC FUNCTION PANEL
ALT: 25 U/L (ref 10–40)
AST: 25 (ref 14–40)
Alkaline Phosphatase: 65 (ref 25–125)
Bilirubin, Total: 0.7

## 2021-03-04 LAB — CBC AND DIFFERENTIAL
HCT: 42 (ref 41–53)
Hemoglobin: 14.2 (ref 13.5–17.5)
Platelets: 137 10*3/uL — AB (ref 150–400)
WBC: 4

## 2021-03-04 LAB — PROTIME-INR: Protime: 10 (ref 10.0–13.8)

## 2021-03-04 LAB — CBC: RBC: 4.44 (ref 3.87–5.11)

## 2021-03-04 LAB — HEMOGLOBIN A1C: Hemoglobin A1C: 5.3

## 2021-08-19 ENCOUNTER — Telehealth: Payer: 59 | Admitting: Physician Assistant

## 2021-08-19 DIAGNOSIS — B029 Zoster without complications: Secondary | ICD-10-CM | POA: Diagnosis not present

## 2021-08-19 MED ORDER — VALACYCLOVIR HCL 1 G PO TABS: 1000.0000 mg | ORAL_TABLET | Freq: Three times a day (TID) | ORAL | 0 refills | Status: AC

## 2021-08-19 NOTE — Progress Notes (Signed)
E-visit for Shingles   We are sorry that you are not feeling well. Here is how we plan to help!  Based on what you shared with me it looks like you have shingles.  Shingles or herpes zoster, is a common infection of the nerves.  It is a painful rash caused by the herpes zoster virus.  This is the same virus that causes chickenpox.  After a person has chickenpox, the virus remains inactive in the nerve cells.  Years later, the virus can become active again and travel to the skin.  It typically will appear on one side of the face or body.  Burning or shooting pain, tingling, or itching are early signs of the infection.  Blisters typically scab over in 7 to 10 days and clear up within 2-4 weeks. Shingles is only contagious to people that have never had the chickenpox, the chickenpox vaccine, or anyone who has a compromised immune system.  You should avoid contact with these type of people until your blisters scab over.  I have prescribed Valacyclovir 1g three times daily for 7 days   HOME CARE: Apply ice packs (wrapped in a thin towel), cool compresses, or soak in cool bath to help reduce pain. Use calamine lotion to calm itchy skin. Avoid scratching the rash. Avoid direct sunlight.  GET HELP RIGHT AWAY IF: Symptoms that don't away after treatment. A rash or blisters near your eye. Increased drainage, fever, or rash after treatment. Severe pain that doesn't go away.   MAKE SURE YOU   Understand these instructions. Will watch your condition. Will get help right away if you are not doing well or get worse.  Thank you for choosing an e-visit.  Your e-visit answers were reviewed by a board certified advanced clinical practitioner to complete your personal care plan. Depending upon the condition, your plan could have included both over the counter or prescription medications.  Please review your pharmacy choice. Make sure the pharmacy is open so you can pick up prescription now. If there is  a problem, you may contact your provider through MyChart messaging and have the prescription routed to another pharmacy.  Your safety is important to us. If you have drug allergies check your prescription carefully.   For the next 24 hours you can use MyChart to ask questions about today's visit, request a non-urgent call back, or ask for a work or school excuse. You will get an email in the next two days asking about your experience. I hope that your e-visit has been valuable and will speed your recovery.  

## 2021-08-19 NOTE — Progress Notes (Signed)
I have spent 5 minutes in review of e-visit questionnaire, review and updating patient chart, medical decision making and response to patient.   Ginevra Tacker Cody Tonette Koehne, PA-C    

## 2021-10-27 ENCOUNTER — Other Ambulatory Visit (HOSPITAL_COMMUNITY): Payer: Self-pay

## 2021-10-27 MED ORDER — LISINOPRIL 5 MG PO TABS
ORAL_TABLET | ORAL | 0 refills | Status: DC
  Filled 2021-10-27: qty 60, 60d supply, fill #0

## 2021-11-09 ENCOUNTER — Other Ambulatory Visit (HOSPITAL_COMMUNITY): Payer: Self-pay

## 2021-11-09 MED ORDER — KETOROLAC TROMETHAMINE 0.5 % OP SOLN
OPHTHALMIC | 0 refills | Status: DC
  Filled 2021-11-09: qty 5, 45d supply, fill #0

## 2021-12-18 ENCOUNTER — Other Ambulatory Visit: Payer: Self-pay | Admitting: Specialist

## 2021-12-18 ENCOUNTER — Other Ambulatory Visit (HOSPITAL_COMMUNITY): Payer: Self-pay | Admitting: Specialist

## 2021-12-18 DIAGNOSIS — M25561 Pain in right knee: Secondary | ICD-10-CM

## 2021-12-21 ENCOUNTER — Ambulatory Visit: Payer: 59 | Admitting: Family Medicine

## 2021-12-24 ENCOUNTER — Other Ambulatory Visit (HOSPITAL_COMMUNITY): Payer: Self-pay

## 2021-12-25 ENCOUNTER — Other Ambulatory Visit (HOSPITAL_COMMUNITY): Payer: Self-pay

## 2021-12-25 ENCOUNTER — Ambulatory Visit: Payer: 59 | Admitting: Family Medicine

## 2021-12-25 ENCOUNTER — Encounter: Payer: Self-pay | Admitting: Family Medicine

## 2021-12-25 VITALS — BP 130/80 | HR 81 | Temp 97.7°F | Ht 70.75 in | Wt 203.6 lb

## 2021-12-25 DIAGNOSIS — Z13220 Encounter for screening for lipoid disorders: Secondary | ICD-10-CM

## 2021-12-25 DIAGNOSIS — Z0000 Encounter for general adult medical examination without abnormal findings: Secondary | ICD-10-CM

## 2021-12-25 DIAGNOSIS — Z125 Encounter for screening for malignant neoplasm of prostate: Secondary | ICD-10-CM | POA: Diagnosis not present

## 2021-12-25 DIAGNOSIS — L989 Disorder of the skin and subcutaneous tissue, unspecified: Secondary | ICD-10-CM | POA: Diagnosis not present

## 2021-12-25 DIAGNOSIS — I10 Essential (primary) hypertension: Secondary | ICD-10-CM | POA: Diagnosis not present

## 2021-12-25 DIAGNOSIS — Z0001 Encounter for general adult medical examination with abnormal findings: Secondary | ICD-10-CM

## 2021-12-25 DIAGNOSIS — Z86010 Personal history of colonic polyps: Secondary | ICD-10-CM | POA: Diagnosis not present

## 2021-12-25 LAB — CBC
HCT: 42.3 % (ref 39.0–52.0)
Hemoglobin: 14.3 g/dL (ref 13.0–17.0)
MCHC: 33.9 g/dL (ref 30.0–36.0)
MCV: 94.1 fl (ref 78.0–100.0)
Platelets: 132 10*3/uL — ABNORMAL LOW (ref 150.0–400.0)
RBC: 4.49 Mil/uL (ref 4.22–5.81)
RDW: 13.4 % (ref 11.5–15.5)
WBC: 5.3 10*3/uL (ref 4.0–10.5)

## 2021-12-25 LAB — COMPREHENSIVE METABOLIC PANEL
ALT: 35 U/L (ref 0–53)
AST: 32 U/L (ref 0–37)
Albumin: 4.7 g/dL (ref 3.5–5.2)
Alkaline Phosphatase: 72 U/L (ref 39–117)
BUN: 15 mg/dL (ref 6–23)
CO2: 27 mEq/L (ref 19–32)
Calcium: 10 mg/dL (ref 8.4–10.5)
Chloride: 101 mEq/L (ref 96–112)
Creatinine, Ser: 1.04 mg/dL (ref 0.40–1.50)
GFR: 77.31 mL/min (ref >60.00)
Glucose, Bld: 89 mg/dL (ref 70–99)
Potassium: 4 mEq/L (ref 3.5–5.1)
Sodium: 138 mEq/L (ref 135–145)
Total Bilirubin: 0.6 mg/dL (ref 0.2–1.2)
Total Protein: 7.6 g/dL (ref 6.0–8.3)

## 2021-12-25 LAB — LIPID PANEL
Cholesterol: 190 mg/dL (ref 0–200)
HDL: 51.2 mg/dL (ref >39.00)
LDL Cholesterol: 101 mg/dL — ABNORMAL HIGH (ref 0–99)
NonHDL: 138.86
Total CHOL/HDL Ratio: 4
Triglycerides: 191 mg/dL — ABNORMAL HIGH (ref 0.0–149.0)
VLDL: 38.2 mg/dL (ref 0.0–40.0)

## 2021-12-25 LAB — PSA: PSA: 0.6 ng/mL (ref 0.10–4.00)

## 2021-12-25 MED ORDER — LISINOPRIL 5 MG PO TABS
ORAL_TABLET | ORAL | 1 refills | Status: DC
  Filled 2021-12-25: qty 90, 90d supply, fill #0
  Filled 2022-03-16: qty 90, 90d supply, fill #1

## 2021-12-29 ENCOUNTER — Ambulatory Visit (HOSPITAL_COMMUNITY): Payer: 59

## 2021-12-29 ENCOUNTER — Encounter (HOSPITAL_COMMUNITY): Payer: Self-pay

## 2022-01-14 ENCOUNTER — Telehealth: Payer: Self-pay

## 2022-01-14 NOTE — Telephone Encounter (Signed)
Patient calling in regards to dermatology referral.  Patient called [facility] to set up new patient visit. He was told they were not accepting any new patients.   Please change location for dermatology referral.  Please make note for any future dermatology referrals; we don't send referrals to [facility].  Thank you for all that you do!

## 2022-02-03 ENCOUNTER — Telehealth: Payer: 59 | Admitting: Physician Assistant

## 2022-02-03 DIAGNOSIS — B029 Zoster without complications: Secondary | ICD-10-CM

## 2022-02-03 MED ORDER — VALACYCLOVIR HCL 1 G PO TABS: 1000.0000 mg | ORAL_TABLET | Freq: Three times a day (TID) | ORAL | 0 refills | Status: AC

## 2022-02-03 NOTE — Progress Notes (Signed)
E-visit for Shingles   We are sorry that you are not feeling well. Here is how we plan to help!  Based on what you shared with me it looks like you have shingles.  Shingles or herpes zoster, is a common infection of the nerves.  It is a painful rash caused by the herpes zoster virus.  This is the same virus that causes chickenpox.  After a person has chickenpox, the virus remains inactive in the nerve cells.  Years later, the virus can become active again and travel to the skin.  It typically will appear on one side of the face or body.  Burning or shooting pain, tingling, or itching are early signs of the infection.  Blisters typically scab over in 7 to 10 days and clear up within 2-4 weeks. Shingles is only contagious to people that have never had the chickenpox, the chickenpox vaccine, or anyone who has a compromised immune system.  You should avoid contact with these type of people until your blisters scab over.  I have prescribed Valacyclovir 1g three times daily for 7 days   HOME CARE: Apply ice packs (wrapped in a thin towel), cool compresses, or soak in cool bath to help reduce pain. Use calamine lotion to calm itchy skin. Avoid scratching the rash. Avoid direct sunlight.  GET HELP RIGHT AWAY IF: Symptoms that don't away after treatment. A rash or blisters near your eye. Increased drainage, fever, or rash after treatment. Severe pain that doesn't go away.   MAKE SURE YOU   Understand these instructions. Will watch your condition. Will get help right away if you are not doing well or get worse.  Thank you for choosing an e-visit.  Your e-visit answers were reviewed by a board certified advanced clinical practitioner to complete your personal care plan. Depending upon the condition, your plan could have included both over the counter or prescription medications.  Please review your pharmacy choice. Make sure the pharmacy is open so you can pick up prescription now. If there is  a problem, you may contact your provider through MyChart messaging and have the prescription routed to another pharmacy.  Your safety is important to us. If you have drug allergies check your prescription carefully.   For the next 24 hours you can use MyChart to ask questions about today's visit, request a non-urgent call back, or ask for a work or school excuse. You will get an email in the next two days asking about your experience. I hope that your e-visit has been valuable and will speed your recovery.  

## 2022-02-03 NOTE — Progress Notes (Signed)
I have spent 5 minutes in review of e-visit questionnaire, review and updating patient chart, medical decision making and response to patient.   Mallie Giambra Cody Layne Dilauro, PA-C    

## 2022-02-04 ENCOUNTER — Other Ambulatory Visit (HOSPITAL_COMMUNITY): Payer: Self-pay

## 2022-02-04 MED ORDER — VALACYCLOVIR HCL 1 G PO TABS
ORAL_TABLET | ORAL | 0 refills | Status: DC
  Filled 2022-02-04: qty 21, 7d supply, fill #0

## 2022-03-05 ENCOUNTER — Telehealth: Payer: 59 | Admitting: Physician Assistant

## 2022-03-05 DIAGNOSIS — T148XXA Other injury of unspecified body region, initial encounter: Secondary | ICD-10-CM

## 2022-03-05 DIAGNOSIS — L089 Local infection of the skin and subcutaneous tissue, unspecified: Secondary | ICD-10-CM | POA: Diagnosis not present

## 2022-03-05 MED ORDER — DOXYCYCLINE HYCLATE 100 MG PO TABS: 100.0000 mg | ORAL_TABLET | Freq: Two times a day (BID) | ORAL | 0 refills | Status: DC

## 2022-03-05 NOTE — Progress Notes (Signed)
I have spent 5 minutes in review of e-visit questionnaire, review and updating patient chart, medical decision making and response to patient.   Jwan Hornbaker Cody Malanie Koloski, PA-C    

## 2022-03-05 NOTE — Progress Notes (Signed)
E Visit for Cellulitis  We are sorry that you are not feeling well. Here is how we plan to help!  Based on what you shared with me it looks like you have cellulitis around the wound.  Cellulitis looks like areas of skin redness, swelling, and warmth; it develops as a result of bacteria entering under the skin.  I have prescribed:  Doxycycline 100 mg twice daily for 7 days.  Call your Dermatologist Monday for follow-up.    HOME CARE:  Take your medications as ordered and take all of them, even if the skin irritation appears to be healing.   GET HELP RIGHT AWAY IF:  Symptoms that don't begin to go away within 48 hours. Severe redness persists or worsens If the area turns color, spreads or swells. If it blisters and opens, develops yellow-brown crust or bleeds. You develop a fever or chills. If the pain increases or becomes unbearable.  Are unable to keep fluids and food down.  MAKE SURE YOU   Understand these instructions. Will watch your condition. Will get help right away if you are not doing well or get worse.  Thank you for choosing an e-visit.  Your e-visit answers were reviewed by a board certified advanced clinical practitioner to complete your personal care plan. Depending upon the condition, your plan could have included both over the counter or prescription medications.  Please review your pharmacy choice. Make sure the pharmacy is open so you can pick up prescription now. If there is a problem, you may contact your provider through [employer] and have the prescription routed to another pharmacy.  Your safety is important to [place]. If you have drug allergies check your prescription carefully.   For the next 24 hours you can use MyChart to ask questions about today's visit, request a non-urgent call back, or ask for a work or school excuse. You will get an email in the next two days asking about your experience. I hope that your e-visit has been valuable and will speed  your recovery.

## 2022-03-16 ENCOUNTER — Other Ambulatory Visit (HOSPITAL_COMMUNITY): Payer: Self-pay

## 2022-03-19 ENCOUNTER — Telehealth: Payer: Self-pay

## 2022-03-19 NOTE — Telephone Encounter (Signed)
Lab to hear of these changes. Okay to stop lisinopril and monitor blood pressure.

## 2022-03-19 NOTE — Telephone Encounter (Signed)
Pt advised of recommendations. If BP is above 130 on top and above 80 on the bottom, let us know.

## 2022-03-19 NOTE — Telephone Encounter (Signed)
Patient states that since his CPE (12/26/21), he has done very well with watching what he eats, he is now working out and exercising, losing some weight.  His question is regarding his blood pressure.  It has been lower than normal since he has changed his lifestyle.  This morning it was 100/65.  He checks his blood pressure regularly and it has been those same numers for about a month and a half.  The highest was  116/70. Should he continue with same dosage with his Lisinopril?  Please advise [phone]

## 2022-03-19 NOTE — Telephone Encounter (Signed)
Please review and advise.

## 2022-04-01 ENCOUNTER — Other Ambulatory Visit (HOSPITAL_COMMUNITY): Payer: Self-pay

## 2022-04-14 ENCOUNTER — Encounter: Payer: Self-pay | Admitting: Family Medicine

## 2022-04-21 ENCOUNTER — Telehealth: Payer: 59 | Admitting: Emergency Medicine

## 2022-04-21 ENCOUNTER — Other Ambulatory Visit (HOSPITAL_COMMUNITY): Payer: Self-pay

## 2022-04-21 DIAGNOSIS — B029 Zoster without complications: Secondary | ICD-10-CM | POA: Diagnosis not present

## 2022-04-21 MED ORDER — VALACYCLOVIR HCL 1 G PO TABS
1000.0000 mg | ORAL_TABLET | Freq: Three times a day (TID) | ORAL | 0 refills | Status: DC
  Filled 2022-04-21: qty 21, 7d supply, fill #0

## 2022-04-21 NOTE — Progress Notes (Signed)

## 2022-04-26 ENCOUNTER — Telehealth: Payer: 59 | Admitting: Physician Assistant

## 2022-04-26 DIAGNOSIS — B9689 Other specified bacterial agents as the cause of diseases classified elsewhere: Secondary | ICD-10-CM

## 2022-04-26 MED ORDER — AMOXICILLIN-POT CLAVULANATE 875-125 MG PO TABS
1.0000 | ORAL_TABLET | Freq: Two times a day (BID) | ORAL | 0 refills | Status: DC
  Filled 2022-04-26: qty 14, 7d supply, fill #0

## 2022-04-26 NOTE — Progress Notes (Signed)

## 2022-04-26 NOTE — Progress Notes (Signed)
I have spent 5 minutes in review of e-visit questionnaire, review and updating patient chart, medical decision making and response to patient.   Awanda Wilcock Cody Gwendlyon Zumbro, PA-C    

## 2022-04-27 ENCOUNTER — Other Ambulatory Visit (HOSPITAL_COMMUNITY): Payer: Self-pay

## 2022-06-19 ENCOUNTER — Other Ambulatory Visit: Payer: Self-pay | Admitting: Family Medicine

## 2022-06-21 ENCOUNTER — Other Ambulatory Visit (HOSPITAL_COMMUNITY): Payer: Self-pay

## 2022-06-21 MED ORDER — LISINOPRIL 5 MG PO TABS
ORAL_TABLET | ORAL | 1 refills | Status: DC
  Filled 2022-06-21: qty 90, 90d supply, fill #0
  Filled 2022-09-19: qty 90, 90d supply, fill #1

## 2022-06-22 ENCOUNTER — Other Ambulatory Visit (HOSPITAL_COMMUNITY): Payer: Self-pay

## 2022-06-23 ENCOUNTER — Other Ambulatory Visit (HOSPITAL_COMMUNITY): Payer: Self-pay

## 2022-06-23 MED ORDER — FML FORTE 0.25 % OP SUSP
OPHTHALMIC | 0 refills | Status: AC
  Filled 2022-06-23: qty 5, 14d supply, fill #0

## 2022-06-24 ENCOUNTER — Other Ambulatory Visit (HOSPITAL_COMMUNITY): Payer: Self-pay

## 2022-07-01 ENCOUNTER — Other Ambulatory Visit (HOSPITAL_COMMUNITY): Payer: Self-pay

## 2022-07-01 MED ORDER — FLUOROMETHOLONE 0.1 % OP SUSP
OPHTHALMIC | 0 refills | Status: DC
  Filled 2022-07-01: qty 5, 14d supply, fill #0

## 2022-07-09 ENCOUNTER — Telehealth: Payer: Self-pay

## 2022-07-09 NOTE — Telephone Encounter (Signed)
A user error has taken place: encounter opened in error, closed for administrative reasons.

## 2022-07-12 ENCOUNTER — Ambulatory Visit: Payer: Commercial Managed Care - PPO | Admitting: Family Medicine

## 2022-07-12 ENCOUNTER — Encounter: Payer: Self-pay | Admitting: Family Medicine

## 2022-07-12 VITALS — BP 124/74 | HR 73 | Temp 97.7°F | Ht 70.75 in | Wt 203.0 lb

## 2022-07-12 DIAGNOSIS — I10 Essential (primary) hypertension: Secondary | ICD-10-CM

## 2022-07-12 LAB — BASIC METABOLIC PANEL
BUN: 16 mg/dL (ref 6–23)
CO2: 25 mEq/L (ref 19–32)
Calcium: 9.3 mg/dL (ref 8.4–10.5)
Chloride: 103 mEq/L (ref 96–112)
Creatinine, Ser: 1.09 mg/dL (ref 0.40–1.50)
GFR: 72.79 mL/min (ref >60.00)
Glucose, Bld: 90 mg/dL (ref 70–99)
Potassium: 4.3 mEq/L (ref 3.5–5.1)
Sodium: 137 mEq/L (ref 135–145)

## 2022-07-12 NOTE — Progress Notes (Signed)
OFFICE VISIT  07/12/2022  CC:  Chief Complaint  Patient presents with   Medical Management of Chronic Issues    Pt is fasting    Patient is a 62 y.o. male who presents for 69-month follow-up hypertension. Last visit patient's blood pressure was stable on lisinopril 5 mg a day.  INTERIM HX: Chase Lowe feels well. Home blood pressures are checked daily and they are average 130/80 or better.  Past Medical History:  Diagnosis Date   Complication of anesthesia    during a colonoscopy pt. woke up during the procedure and also during a hand surgery    Duodenal ulcer 1983   GERD (gastroesophageal reflux disease)    Hay fever    History of adenomatous polyp of colon    2016 (Dr. Randa Evens, [facility])   History of blood product transfusion    Hypertension    Primary osteoarthritis of knees, bilateral    Shingles    Right lateral hip extending down over right thigh and knee, initial episode 04/2022, recurrence x1 after.  No postherpetic neuralgia.    Past Surgical History:  Procedure Laterality Date   ANTERIOR CERVICAL DECOMP/DISCECTOMY FUSION N/A 06/30/2017   Procedure: ANTERIOR CERVICAL DECOMPRESSION/DISCECTOMY FUSION C5-6;  Surgeon: Venita Lick, MD;  Location: [facility] OR;  Service: Orthopedics;  Laterality: N/A;  3 hrs   COLONOSCOPY  [id number]   09/02/14 polyps x 3 ([facility])   GANGLION CYST EXCISION Left 2006   HEEL SPUR SURGERY Right 2013   HEEL SPUR SURGERY Right    2nd surgery   INGUINAL HERNIA REPAIR Left 04/26/2014   Procedure: LEFT INGUINAL HERNIA REPAIR WITH MESH;  Surgeon: Abigail Miyamoto, MD;  Location: [facility] OR;  Service: General;  Laterality: Left;   INGUINAL HERNIA REPAIR Right 02/20/2016   Procedure: RIGHT INGUINAL HERNIA REPAIR WITH MESH;  Surgeon: Abigail Miyamoto, MD;  Location: [facility] OR;  Service: General;  Laterality: Right;   INSERTION OF MESH Left 04/26/2014   Procedure: INSERTION OF MESH;  Surgeon: Abigail Miyamoto, MD;  Location: [facility] OR;  Service: General;  Laterality: Left;    INSERTION OF MESH Right 02/20/2016   Procedure: INSERTION OF MESH;  Surgeon: Abigail Miyamoto, MD;  Location: [facility] OR;  Service: General;  Laterality: Right;   KNEE ARTHROSCOPY Left 02/2015   left   KNEE ARTHROSCOPY Left 12/2015   KNEE ARTHROSCOPY Right    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   Social History   Socioeconomic History   Marital status: Married    Spouse name: Not on file   Number of children: Not on file   Years of education: Not on file   Highest education level: 12th grade  Occupational History   Not on file  Tobacco Use   Smoking status: Former    Years: 17.00    Types: Cigarettes    Quit date: 1989    Years since quitting: 35.0    Passive exposure: Never   Smokeless tobacco: Never   Tobacco comments:    quit 1989  Vaping Use   Vaping Use: Never used  Substance and Sexual Activity   Alcohol use: Yes    Alcohol/week: 6.0 - 12.0 standard drinks of alcohol    Types: 6 - 12 Cans of beer per week    Comment: occasional   Drug use: No   Sexual activity: Not on file  Other Topics Concern   Not on file  Social History Narrative   Married,   Educ: HS   Occup: Airline pilot   Social Determinants  of Health   Financial Resource Strain: Low Risk  (07/11/2022)   Overall Financial Resource Strain (CARDIA)    Difficulty of Paying Living Expenses: Not hard at all  Food Insecurity: No Food Insecurity (07/11/2022)   Hunger Vital Sign    Worried About Running Out of Food in the Last Year: Never true    Ran Out of Food in the Last Year: Never true  Transportation Needs: No Transportation Needs (07/11/2022)   PRAPARE - Hydrologist (Medical): No    Lack of Transportation (Non-Medical): No  Physical Activity: Sufficiently Active (07/11/2022)   Exercise Vital Sign    Days of Exercise per Week: 5 days    Minutes of Exercise per Session: 30 min  Stress: No Stress Concern Present (07/11/2022)   [place]    Feeling of Stress : Only a little  Social Connections: Moderately Isolated (07/11/2022)   Social Connection and Isolation Panel [NHANES]    Frequency of Communication with Friends and Family: More than three times a week    Frequency of Social Gatherings with Friends and Family: Once a week    Attends Religious Services: Never    Marine scientist or Organizations: No    Attends Music therapist: Not on file    Marital Status: Married    Outpatient Medications Prior to Visit  Medication Sig Dispense Refill   ASPIRIN 81 PO Take 81 mg by mouth daily.     fluorometholone (FML) 0.1 % ophthalmic suspension Instill 1 drop into right eye four times a day as directed for one week then twice daily for one week 5 mL 0   lisinopril (ZESTRIL) 5 MG tablet Take 1 tablet by mouth daily. 90 tablet 1   Multiple Vitamins-Minerals (MULTIVITAMIN PO) Take 1 tablet by mouth daily.      amoxicillin-clavulanate (AUGMENTIN) 875-125 MG tablet Take 1 tablet by mouth 2 (two) times daily. 14 tablet 0   ketorolac (ACULAR) 0.5 % ophthalmic solution Instill 1 drop into both eyes once a day as needed (Patient not taking: Reported on 12/25/2021) 5 mL 0   valACYclovir (VALTREX) 1000 MG tablet Take 1 tablet (1,000 mg total) by mouth 3 (three) times daily for 7 days 21 tablet 0   No facility-administered medications prior to visit.    Allergies  Allergen Reactions   Aspirin Other (See Comments)    High doses contraindicated due to ulcer   Codeine Nausea And Vomiting   Oxycodone-Acetaminophen Nausea And Vomiting    Review of Systems As per HPI  PE:    07/12/2022    8:00 AM 12/25/2021    8:56 AM 12/25/2021    8:33 AM  Vitals with BMI  Height 5' 10.75"  5' 10.75"  Weight 203 lbs  203 lbs 10 oz  BMI 23.30  07.6  Systolic 226 333 545  Diastolic 88 80 90  Pulse 73  81     Physical Exam  Gen: Alert, well appearing.  Patient is oriented to person, place, time, and  situation. AFFECT: pleasant, lucid thought and speech. CV: RRR, no m/r/g.   LUNGS: CTA bilat, nonlabored resps, good aeration in all lung fields. EXT: no clubbing or cyanosis.  no edema.    LABS:  Last CBC Lab Results  Component Value Date   WBC 5.3 12/25/2021   HGB 14.3 12/25/2021   HCT 42.3 12/25/2021   MCV 94.1 12/25/2021   MCH  31.6 06/23/2017   RDW 13.4 12/25/2021   PLT 132.0 (L) 12/25/2021   Last metabolic panel Lab Results  Component Value Date   GLUCOSE 89 12/25/2021   NA 138 12/25/2021   K 4.0 12/25/2021   CL 101 12/25/2021   CO2 27 12/25/2021   BUN 15 12/25/2021   CREATININE 1.04 12/25/2021   EGFR 83 03/04/2021   CALCIUM 10.0 12/25/2021   PROT 7.6 12/25/2021   ALBUMIN 4.7 12/25/2021   BILITOT 0.6 12/25/2021   ALKPHOS 72 12/25/2021   AST 32 12/25/2021   ALT 35 12/25/2021   Last lipids Lab Results  Component Value Date   CHOL 190 12/25/2021   HDL 51.20 12/25/2021   LDLCALC 101 (H) 12/25/2021   TRIG 191.0 (H) 12/25/2021   CHOLHDL 4 12/25/2021   Last hemoglobin A1c Lab Results  Component Value Date   HGBA1C 5.3 03/04/2021   IMPRESSION AND PLAN:  Essential hypertension, well-controlled on lisinopril 5 mg a day. Electrolytes and creatinine today.  An After Visit Summary was printed and given to the patient.  FOLLOW UP: Return in about 6 months (around 01/10/2023) for annual CPE (fasting).  Signed:  Santiago Bumpers, MD           07/12/2022

## 2022-07-26 ENCOUNTER — Telehealth: Payer: Commercial Managed Care - PPO | Admitting: Nurse Practitioner

## 2022-07-26 ENCOUNTER — Other Ambulatory Visit (HOSPITAL_COMMUNITY): Payer: Self-pay

## 2022-07-26 DIAGNOSIS — L0390 Cellulitis, unspecified: Secondary | ICD-10-CM

## 2022-07-26 MED ORDER — CEPHALEXIN 500 MG PO CAPS
500.0000 mg | ORAL_CAPSULE | Freq: Four times a day (QID) | ORAL | 0 refills | Status: DC
  Filled 2022-07-26: qty 20, 5d supply, fill #0

## 2022-07-26 NOTE — Progress Notes (Signed)
E Visit for Cellulitis  We are sorry that you are not feeling well. Here is how we plan to help!  Based on what you shared with me it looks like you have cellulitis.  Cellulitis looks like areas of skin redness, swelling, and warmth; it develops as a result of bacteria entering under the skin. Little red spots and/or bleeding can be seen in skin, and tiny surface sacs containing fluid can occur. Fever can be present. Cellulitis is almost always on one side of a body, and the lower limbs are the most common site of involvement.   I have prescribed:  Keflex 592m take one by mouth four times a day for 5 days  HOME CARE:  Take your medications as ordered and take all of them, even if the skin irritation appears to be healing.   GET HELP RIGHT AWAY IF:  Symptoms that don't begin to go away within 48 hours. Severe redness persists or worsens If the area turns color, spreads or swells. If it blisters and opens, develops yellow-brown crust or bleeds. You develop a fever or chills. If the pain increases or becomes unbearable.  Are unable to keep fluids and food down.  MAKE SURE YOU   Understand these instructions. Will watch your condition. Will get help right away if you are not doing well or get worse.  Thank you for choosing an e-visit.  Your e-visit answers were reviewed by a board certified advanced clinical practitioner to complete your personal care plan. Depending upon the condition, your plan could have included both over the counter or prescription medications.  Please review your pharmacy choice. Make sure the pharmacy is open so you can pick up prescription now. If there is a problem, you may contact your provider through [employer] have the prescription routed to another pharmacy.  Your safety is important to [place] If you have drug allergies check your prescription carefully.   For the next 24 hours you can use MyChart to ask questions about today's visit, request a  non-urgent call back, or ask for a work or school excuse. You will get an email in the next two days asking about your experience. I hope that your e-visit has been valuable and will speed your recovery.  I have spent 5 minutes in review of e-visit questionnaire, review and updating patient chart, medical decision making and response to patient.   JMar Daring PA-C

## 2022-07-29 ENCOUNTER — Encounter: Payer: Self-pay | Admitting: Family Medicine

## 2022-07-30 ENCOUNTER — Encounter: Payer: Self-pay | Admitting: Family Medicine

## 2022-07-30 ENCOUNTER — Ambulatory Visit (INDEPENDENT_AMBULATORY_CARE_PROVIDER_SITE_OTHER): Payer: Commercial Managed Care - PPO | Admitting: Family Medicine

## 2022-07-30 ENCOUNTER — Other Ambulatory Visit (HOSPITAL_COMMUNITY): Payer: Self-pay

## 2022-07-30 VITALS — BP 109/75 | HR 90 | Temp 97.9°F | Ht 70.75 in | Wt 208.2 lb

## 2022-07-30 DIAGNOSIS — J019 Acute sinusitis, unspecified: Secondary | ICD-10-CM | POA: Diagnosis not present

## 2022-07-30 DIAGNOSIS — B9689 Other specified bacterial agents as the cause of diseases classified elsewhere: Secondary | ICD-10-CM | POA: Diagnosis not present

## 2022-07-30 MED ORDER — AMOXICILLIN-POT CLAVULANATE 875-125 MG PO TABS
1.0000 | ORAL_TABLET | Freq: Two times a day (BID) | ORAL | 0 refills | Status: DC
  Filled 2022-07-30: qty 20, 10d supply, fill #0

## 2022-07-30 NOTE — Progress Notes (Signed)
OFFICE VISIT  07/30/2022  CC:  Chief Complaint  Patient presents with   Head congestion    Ongoing for the last 2-3 weeks, has tried taking old rx (Augmentin 800mg ), fatigue, body aches, sinus headache (L side), sore throat (R side), dry cough mainly, white/clear phlegm x 5-6d. Home covid test completed Wednesday (Negative)    Patient is a 62 y.o. male who presents for head congestion.  HPI: Has had over 10 days of nasal congestion, sinus pressure, headache, postnasal drip, some sore throat, and fatigue/malaise.  Has also had some cough.  In the last few days he has had more of a left-sided face pain and left upper teeth pain. No fever, no shortness of breath or wheezing. Home COVID test -2 days ago  He found some Augmentin left at his house from a prior sinus infection, has taken 4 tabs total over the last couple of days and does feel some better today.  Past Medical History:  Diagnosis Date   Complication of anesthesia    during a colonoscopy pt. woke up during the procedure and also during a hand surgery    Duodenal ulcer 1983   GERD (gastroesophageal reflux disease)    Hay fever    History of adenomatous polyp of colon    2016 (Dr. Oletta Lamas, [place])   History of blood product transfusion    Hypertension    Primary osteoarthritis of knees, bilateral    Shingles    Right lateral hip extending down over right thigh and knee, initial episode 04/2022, recurrence x1 after.  No postherpetic neuralgia.    Past Surgical History:  Procedure Laterality Date   ANTERIOR CERVICAL DECOMP/DISCECTOMY FUSION N/A 06/30/2017   Procedure: ANTERIOR CERVICAL DECOMPRESSION/DISCECTOMY FUSION C5-6;  Surgeon: Melina Schools, MD;  Location: [place];  Service: Orthopedics;  Laterality: N/A;  3 hrs   COLONOSCOPY  [id number]   09/02/14 polyps x 3 ([facility])   GANGLION CYST EXCISION Left 2006   HEEL SPUR SURGERY Right 2013   HEEL SPUR SURGERY Right    2nd surgery   INGUINAL HERNIA REPAIR Left 04/26/2014    Procedure: LEFT INGUINAL HERNIA REPAIR WITH MESH;  Surgeon: Coralie Keens, MD;  Location: [place];  Service: General;  Laterality: Left;   INGUINAL HERNIA REPAIR Right 02/20/2016   Procedure: RIGHT INGUINAL HERNIA REPAIR WITH MESH;  Surgeon: Coralie Keens, MD;  Location: [place];  Service: General;  Laterality: Right;   INSERTION OF MESH Left 04/26/2014   Procedure: INSERTION OF MESH;  Surgeon: Coralie Keens, MD;  Location: [place];  Service: General;  Laterality: Left;   INSERTION OF MESH Right 02/20/2016   Procedure: INSERTION OF MESH;  Surgeon: Coralie Keens, MD;  Location: [place];  Service: General;  Laterality: Right;   KNEE ARTHROSCOPY Left 02/2015   left   KNEE ARTHROSCOPY Left 12/2015   KNEE ARTHROSCOPY Right    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     fluorometholone (FML) 0.1 % ophthalmic suspension Instill 1 drop into right eye four times a day as directed for one week then twice daily for one week 5 mL 0   lisinopril (ZESTRIL) 5 MG tablet Take 1 tablet by mouth daily. 90 tablet 1   Multiple Vitamins-Minerals (MULTIVITAMIN PO) Take 1 tablet by mouth daily.      cephALEXin (KEFLEX) 500 MG capsule Take 1 capsule (500 mg  total) by mouth 4 (four) times daily for 5 days. 20 capsule 0   No facility-administered medications prior to visit.    Allergies  Allergen Reactions   Aspirin Other (See Comments)    High doses contraindicated due to ulcer   Codeine Nausea And Vomiting   Oxycodone-Acetaminophen Nausea And Vomiting    Review of Systems  As per HPI  PE:    07/30/2022    2:40 PM 07/12/2022    8:15 AM 07/12/2022    8:00 AM  Vitals with BMI  Height 5' 10.75"  5' 10.75"  Weight 208 lbs 3 oz  203 lbs  BMI 76.16  07.37  Systolic 106 269 485  Diastolic 75 74 88  Pulse 90  73    Physical Exam  VS: noted--normal. Gen: alert, NAD, NONTOXIC  APPEARING. HEENT: eyes without injection, drainage, or swelling.  Ears: EACs clear, TMs with normal light reflex and landmarks.  Nose: Clear rhinorrhea, with some dried, crusty exudate adherent to mildly injected mucosa.  No purulent d/c.  No paranasal sinus TTP.  No facial swelling.  Throat and mouth without focal lesion.  No pharyngial swelling, erythema, or exudate.   Neck: supple, no LAD.   LUNGS: CTA bilat, nonlabored resps.   CV: RRR, no m/r/g. EXT: no c/c/e SKIN: no rash   LABS:   Last metabolic panel Lab Results  Component Value Date   GLUCOSE 90 07/12/2022   NA 137 07/12/2022   K 4.3 07/12/2022   CL 103 07/12/2022   CO2 25 07/12/2022   BUN 16 07/12/2022   CREATININE 1.09 07/12/2022   EGFR 83 03/04/2021   CALCIUM 9.3 07/12/2022   PROT 7.6 12/25/2021   ALBUMIN 4.7 12/25/2021   BILITOT 0.6 12/25/2021   ALKPHOS 72 12/25/2021   AST 32 12/25/2021   ALT 35 12/25/2021   IMPRESSION AND PLAN:  Acute sinusitis. Augmentin 875 twice daily x 10 days. Saline nasal spray encouraged.  An After Visit Summary was printed and given to the patient.  FOLLOW UP: Return if symptoms worsen or fail to improve.  Signed:  Crissie Sickles, MD           07/30/2022

## 2022-08-19 DIAGNOSIS — M1812 Unilateral primary osteoarthritis of first carpometacarpal joint, left hand: Secondary | ICD-10-CM | POA: Diagnosis not present

## 2022-08-19 DIAGNOSIS — M79645 Pain in left finger(s): Secondary | ICD-10-CM | POA: Diagnosis not present

## 2022-09-21 ENCOUNTER — Other Ambulatory Visit: Payer: Self-pay

## 2022-09-21 ENCOUNTER — Other Ambulatory Visit (HOSPITAL_COMMUNITY): Payer: Self-pay

## 2022-09-29 DIAGNOSIS — D225 Melanocytic nevi of trunk: Secondary | ICD-10-CM | POA: Diagnosis not present

## 2022-09-29 DIAGNOSIS — L820 Inflamed seborrheic keratosis: Secondary | ICD-10-CM | POA: Diagnosis not present

## 2022-09-29 DIAGNOSIS — L821 Other seborrheic keratosis: Secondary | ICD-10-CM | POA: Diagnosis not present

## 2022-09-29 DIAGNOSIS — L578 Other skin changes due to chronic exposure to nonionizing radiation: Secondary | ICD-10-CM | POA: Diagnosis not present

## 2022-10-18 ENCOUNTER — Telehealth: Payer: Commercial Managed Care - PPO | Admitting: Nurse Practitioner

## 2022-10-18 DIAGNOSIS — J0140 Acute pansinusitis, unspecified: Secondary | ICD-10-CM

## 2022-10-18 MED ORDER — DOXYCYCLINE HYCLATE 100 MG PO TABS: 100.0000 mg | ORAL_TABLET | Freq: Two times a day (BID) | ORAL | 0 refills | Status: AC

## 2022-10-18 NOTE — Progress Notes (Signed)
E-Visit for Sinus Problems  We are sorry that you are not feeling well.  Here is how we plan to help!  Based on what you have shared with me it looks like you have sinusitis.  Sinusitis is inflammation and infection in the sinus cavities of the head.  Based on your presentation I believe you most likely have Acute Bacterial Sinusitis.  This is an infection caused by bacteria and is treated with antibiotics. I have prescribed Doxycycline 100mg by mouth twice a day for 10 days. You may use an oral decongestant such as Mucinex D or if you have glaucoma or high blood pressure use plain Mucinex. Saline nasal spray help and can safely be used as often as needed for congestion.  If you develop worsening sinus pain, fever or notice severe headache and vision changes, or if symptoms are not better after completion of antibiotic, please schedule an appointment with a health care provider.    Sinus infections are not as easily transmitted as other respiratory infection, however we still recommend that you avoid close contact with loved ones, especially the very young and elderly.  Remember to wash your hands thoroughly throughout the day as this is the number one way to prevent the spread of infection!  Home Care: Only take medications as instructed by your medical team. Complete the entire course of an antibiotic. Do not take these medications with alcohol. A steam or ultrasonic humidifier can help congestion.  You can place a towel over your head and breathe in the steam from hot water coming from a faucet. Avoid close contacts especially the very young and the elderly. Cover your mouth when you cough or sneeze. Always remember to wash your hands.  Get Help Right Away If: You develop worsening fever or sinus pain. You develop a severe head ache or visual changes. Your symptoms persist after you have completed your treatment plan.  Make sure you Understand these instructions. Will watch your  condition. Will get help right away if you are not doing well or get worse.  Thank you for choosing an e-visit.  Your e-visit answers were reviewed by a board certified advanced clinical practitioner to complete your personal care plan. Depending upon the condition, your plan could have included both over the counter or prescription medications.  Please review your pharmacy choice. Make sure the pharmacy is open so you can pick up prescription now. If there is a problem, you may contact your provider through MyChart messaging and have the prescription routed to another pharmacy.  Your safety is important to us. If you have drug allergies check your prescription carefully.   For the next 24 hours you can use MyChart to ask questions about today's visit, request a non-urgent call back, or ask for a work or school excuse. You will get an email in the next two days asking about your experience. I hope that your e-visit has been valuable and will speed your recovery.   Meds ordered this encounter  Medications   doxycycline (VIBRA-TABS) 100 MG tablet    Sig: Take 1 tablet (100 mg total) by mouth 2 (two) times daily for 10 days.    Dispense:  20 tablet    Refill:  0    I spent approximately 5 minutes reviewing the patient's history, current symptoms and coordinating their care today.   

## 2022-11-01 DIAGNOSIS — Z4789 Encounter for other orthopedic aftercare: Secondary | ICD-10-CM | POA: Diagnosis not present

## 2022-11-01 DIAGNOSIS — D219 Benign neoplasm of connective and other soft tissue, unspecified: Secondary | ICD-10-CM | POA: Diagnosis not present

## 2022-11-01 DIAGNOSIS — M720 Palmar fascial fibromatosis [Dupuytren]: Secondary | ICD-10-CM | POA: Diagnosis not present

## 2022-11-01 DIAGNOSIS — M79641 Pain in right hand: Secondary | ICD-10-CM | POA: Diagnosis not present

## 2022-11-05 DIAGNOSIS — Z96651 Presence of right artificial knee joint: Secondary | ICD-10-CM | POA: Diagnosis not present

## 2022-11-05 DIAGNOSIS — M25561 Pain in right knee: Secondary | ICD-10-CM | POA: Diagnosis not present

## 2022-11-05 DIAGNOSIS — T8484XA Pain due to internal orthopedic prosthetic devices, implants and grafts, initial encounter: Secondary | ICD-10-CM | POA: Diagnosis not present

## 2022-11-08 DIAGNOSIS — H524 Presbyopia: Secondary | ICD-10-CM | POA: Diagnosis not present

## 2022-11-10 ENCOUNTER — Ambulatory Visit: Payer: Commercial Managed Care - PPO | Admitting: Family Medicine

## 2022-11-10 NOTE — Progress Notes (Signed)
Pt. Needs orders for surgery. 

## 2022-11-10 NOTE — Patient Instructions (Signed)
DUE TO COVID-19 ONLY TWO VISITORS  (aged 63 and older)  ARE ALLOWED TO COME WITH YOU AND STAY IN THE WAITING ROOM ONLY DURING PRE OP AND PROCEDURE.   **NO VISITORS ARE ALLOWED IN THE SHORT STAY AREA OR RECOVERY ROOM!!**  IF YOU WILL BE ADMITTED INTO THE HOSPITAL YOU ARE ALLOWED ONLY FOUR SUPPORT PEOPLE DURING VISITATION HOURS ONLY (7 AM -8PM)   The support person(s) must pass our screening, gel in and out, and wear a mask at all times, including in the patient's room. Patients must also wear a mask when staff or their support person are in the room. Visitors GUEST BADGE MUST BE WORN VISIBLY  One adult visitor may remain with you overnight and MUST be in the room by 8 P.M.     Your procedure is scheduled on: 11/18/22   Report to [facility] Main Entrance    Report to admitting at : 7:15 AM   Call this number if you have problems the morning of surgery [phone]   Do not eat food :After Midnight.   After Midnight you may have the following liquids until : 6:30 AM DAY OF SURGERY  Water Black Coffee (sugar ok, NO MILK/CREAM OR CREAMERS)  Tea (sugar ok, NO MILK/CREAM OR CREAMERS) regular and decaf                             Plain Jell-O (NO RED)                                           Fruit ices (not with fruit pulp, NO RED)                                     Popsicles (NO RED)                                                                  Juice: apple, WHITE grape, WHITE cranberry Sports drinks like Gatorade (NO RED)   The day of surgery:  Drink ONE (1) Pre-Surgery Clear Ensure at : 6:30 AM the morning of surgery. Drink in one sitting. Do not sip.  This drink was given to you during your hospital  pre-op appointment visit. Nothing else to drink after completing the  Pre-Surgery Clear Ensure or G2.          If you have questions, please contact your surgeon's office.   Oral Hygiene is also important to reduce your risk of infection.                                     Remember - BRUSH YOUR TEETH THE MORNING OF SURGERY WITH YOUR REGULAR TOOTHPASTE  DENTURES WILL BE REMOVED PRIOR TO SURGERY PLEASE DO NOT APPLY "Poly grip" OR ADHESIVES!!!   Do NOT smoke after Midnight   Take these medicines the morning of surgery with A SIP OF WATER: cetirizine as needed.  DO NOT TAKE ANY  ORAL DIABETIC MEDICATIONS DAY OF YOUR SURGERY  Bring CPAP mask and tubing day of surgery.                              You may not have any metal on your body including hair pins, jewelry, and body piercing             Do not wear lotions, powders, perfumes/cologne, or deodorant              Men may shave face and neck.   Do not bring valuables to the hospital. [facility] IS NOT             RESPONSIBLE   FOR VALUABLES.   Contacts, glasses, or bridgework may not be worn into surgery.   Bring small overnight bag day of surgery.   DO NOT BRING YOUR HOME MEDICATIONS TO THE HOSPITAL. PHARMACY WILL DISPENSE MEDICATIONS LISTED ON YOUR MEDICATION LIST TO YOU DURING YOUR ADMISSION IN THE HOSPITAL!    Patients discharged on the day of surgery will not be allowed to drive home.  Someone NEEDS to stay with you for the first 24 hours after anesthesia.   Special Instructions: Bring a copy of your healthcare power of attorney and living will documents         the day of surgery if you haven't scanned them before.              Please read over the following fact sheets you were given: IF YOU HAVE QUESTIONS ABOUT YOUR PRE-OP INSTRUCTIONS PLEASE CALL [phone]

## 2022-11-10 NOTE — Progress Notes (Deleted)
Office Note 11/10/2022  CC: No chief complaint on file.   HPI:  Patient is a 62 y.o. male who is here for presurgical medical clearance.  He has revision of total knee femoral component only scheduled for 11/18/2022.  Past Medical History:  Diagnosis Date   Complication of anesthesia    during a colonoscopy pt. woke up during the procedure and also during a hand surgery    Duodenal ulcer 1983   GERD (gastroesophageal reflux disease)    Hay fever    History of adenomatous polyp of colon    2016 (Dr. Randa Evens, [place])   History of blood product transfusion    Hypertension    Primary osteoarthritis of knees, bilateral    Shingles    Right lateral hip extending down over right thigh and knee, initial episode 04/2022, recurrence x1 after.  No postherpetic neuralgia.    Past Surgical History:  Procedure Laterality Date   ANTERIOR CERVICAL DECOMP/DISCECTOMY FUSION N/A 06/30/2017   Procedure: ANTERIOR CERVICAL DECOMPRESSION/DISCECTOMY FUSION C5-6;  Surgeon: Venita Lick, MD;  Location[facility];  Service: Orthopedics;  Laterality: N/A;  3 hrs   COLONOSCOPY  [id number]   09/02/14 polyps x 3 ([facility])   GANGLION CYST EXCISION Left 2006   HEEL SPUR SURGERY Right 2013   HEEL SPUR SURGERY Right    2nd surgery   INGUINAL HERNIA REPAIR Left 04/26/2014   Procedure: LEFT INGUINAL HERNIA REPAIR WITH MESH;  Surgeon: Abigail Miyamoto, MD;  Location: [facility] OR;  Service: General;  Laterality: Left;   INGUINAL HERNIA REPAIR Right 02/20/2016   Procedure: RIGHT INGUINAL HERNIA REPAIR WITH MESH;  Surgeon: Abigail Miyamoto, MD;  Location: [facility] OR;  Service: General;  Laterality: Right;   INSERTION OF MESH Left 04/26/2014   Procedure: INSERTION OF MESH;  Surgeon: Abigail Miyamoto, MD;  Location: [facility] OR;  Service: General;  Laterality: Left;   INSERTION OF MESH Right 02/20/2016   Procedure: INSERTION OF MESH;  Surgeon: Abigail Miyamoto, MD;  Location: [facility] OR;  Service: General;  Laterality: Right;   KNEE  ARTHROSCOPY Left 02/2015   left   KNEE ARTHROSCOPY Left 12/2015   KNEE ARTHROSCOPY Right    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    Family History  Problem Relation Age of Onset   Scoliosis Mother    Healthy Brother     Social History   Socioeconomic History   Marital status: Married    Spouse name: Not on file   Number of children: Not on file   Years of education: Not on file   Highest education level: 12th grade  Occupational History   Not on file  Tobacco Use   Smoking status: Former    Years: 17    Types: Cigarettes    Quit date: 1989    Years since quitting: 35.3    Passive exposure: Never   Smokeless tobacco: Never   Tobacco comments:    quit 1989  Vaping Use   Vaping Use: Never used  Substance and Sexual Activity   Alcohol use: Yes    Alcohol/week: 6.0 - 12.0 standard drinks of alcohol    Types: 6 - 12 Cans of beer per week    Comment: occasional   Drug use: No   Sexual activity: Not on file  Other Topics Concern   Not on file  Social History Narrative   Married,   Educ: HS   Occup: sales   Social Determinants of Health   Financial Resource Strain: Low Risk  (07/11/2022)  Overall Financial Resource Strain (CARDIA)    Difficulty of Paying Living Expenses: Not hard at all  Food Insecurity: No Food Insecurity (07/11/2022)   Hunger Vital Sign    Worried About Running Out of Food in the Last Year: Never true    Ran Out of Food in the Last Year: Never true  Transportation Needs: No Transportation Needs (07/11/2022)   PRAPARE - Administrator, Civil Service (Medical): No    Lack of Transportation (Non-Medical): No  Physical Activity: Sufficiently Active (07/11/2022)   Exercise Vital Sign    Days of Exercise per Week: 5 days    Minutes of Exercise per Session: 30 min  Stress: No Stress Concern Present (07/11/2022)   [employer] of Occupational Health - Occupational Stress Questionnaire    Feeling of Stress : Only a little  Social  Connections: Moderately Isolated (07/11/2022)   Social Connection and Isolation Panel [NHANES]    Frequency of Communication with Friends and Family: More than three times a week    Frequency of Social Gatherings with Friends and Family: Once a week    Attends Religious Services: Never    Database administrator or Organizations: No    Attends Engineer, structural: Not on file    Marital Status: Married  Catering manager Violence: Not on file    Outpatient Medications Prior to Visit  Medication Sig Dispense Refill   aspirin EC 81 MG tablet Take 81 mg by mouth daily. Swallow whole.     cetirizine (ZYRTEC) 10 MG tablet Take 10 mg by mouth daily as needed for allergies.     lisinopril (ZESTRIL) 5 MG tablet Take 1 tablet by mouth daily. 90 tablet 1   Multiple Vitamins-Minerals (MULTIVITAMIN PO) Take 1 tablet by mouth daily.      No facility-administered medications prior to visit.    Allergies  Allergen Reactions   Aspirin Other (See Comments)    High doses contraindicated due to ulcer   Codeine Nausea And Vomiting   Oxycodone-Acetaminophen Nausea And Vomiting    Review of Systems *** PE;    07/30/2022    2:40 PM 07/12/2022    8:15 AM 07/12/2022    8:00 AM  Vitals with BMI  Height 5' 10.75"  5' 10.75"  Weight 208 lbs 3 oz  203 lbs  BMI 29.25  28.51  Systolic 109 124 865  Diastolic 75 74 88  Pulse 90  73     *** Pertinent labs:   Lab Results  Component Value Date   WBC 5.3 12/25/2021   HGB 14.3 12/25/2021   HCT 42.3 12/25/2021   MCV 94.1 12/25/2021   PLT 132.0 (L) 12/25/2021   Lab Results  Component Value Date   CREATININE 1.09 07/12/2022   BUN 16 07/12/2022   NA 137 07/12/2022   K 4.3 07/12/2022   CL 103 07/12/2022   CO2 25 07/12/2022   Lab Results  Component Value Date   ALT 35 12/25/2021   AST 32 12/25/2021   ALKPHOS 72 12/25/2021   BILITOT 0.6 12/25/2021   Lab Results  Component Value Date   CHOL 190 12/25/2021   Lab Results  Component  Value Date   HDL 51.20 12/25/2021   Lab Results  Component Value Date   LDLCALC 101 (H) 12/25/2021   Lab Results  Component Value Date   TRIG 191.0 (H) 12/25/2021   Lab Results  Component Value Date   CHOLHDL 4 12/25/2021   Lab Results  Component Value Date   PSA 0.60 12/25/2021   Lab Results  Component Value Date   HGBA1C 5.3 03/04/2021   ASSESSMENT AND PLAN:   No problem-specific Assessment & Plan notes found for this encounter. No labs needed Update soc hx  An After Visit Summary was printed and given to the patient.  FOLLOW UP:  No follow-ups on file. Next health maintenance exam around 12/26/22  Signed:  Santiago Bumpers, MD           11/10/2022

## 2022-11-11 ENCOUNTER — Encounter (HOSPITAL_COMMUNITY): Payer: Self-pay

## 2022-11-11 ENCOUNTER — Ambulatory Visit (INDEPENDENT_AMBULATORY_CARE_PROVIDER_SITE_OTHER): Payer: Commercial Managed Care - PPO | Admitting: Family Medicine

## 2022-11-11 ENCOUNTER — Other Ambulatory Visit: Payer: Self-pay

## 2022-11-11 ENCOUNTER — Encounter: Payer: Self-pay | Admitting: Family Medicine

## 2022-11-11 ENCOUNTER — Other Ambulatory Visit (HOSPITAL_COMMUNITY): Payer: Self-pay

## 2022-11-11 ENCOUNTER — Encounter (HOSPITAL_COMMUNITY)
Admission: RE | Admit: 2022-11-11 | Discharge: 2022-11-11 | Disposition: A | Discharge status: Home / Self Care | Payer: Commercial Managed Care - PPO | Source: Ambulatory Visit | Attending: Orthopedic Surgery | Admitting: Orthopedic Surgery

## 2022-11-11 VITALS — BP 151/106 | HR 79 | Temp 97.9°F | Ht 71.0 in | Wt 224.0 lb

## 2022-11-11 VITALS — BP 137/86 | HR 73 | Temp 97.8°F | Resp 12 | Ht 71.0 in | Wt 209.4 lb

## 2022-11-11 DIAGNOSIS — I2510 Atherosclerotic heart disease of native coronary artery without angina pectoris: Secondary | ICD-10-CM | POA: Diagnosis not present

## 2022-11-11 DIAGNOSIS — Z8619 Personal history of other infectious and parasitic diseases: Secondary | ICD-10-CM | POA: Diagnosis not present

## 2022-11-11 DIAGNOSIS — Z01818 Encounter for other preprocedural examination: Secondary | ICD-10-CM | POA: Diagnosis not present

## 2022-11-11 DIAGNOSIS — I10 Essential (primary) hypertension: Secondary | ICD-10-CM | POA: Diagnosis not present

## 2022-11-11 HISTORY — DX: Other specified postprocedural states: Z98.890

## 2022-11-11 HISTORY — DX: Other specified postprocedural states: R11.2

## 2022-11-11 LAB — BASIC METABOLIC PANEL
Anion gap: 8 (ref 5–15)
BUN: 21 mg/dL (ref 8–23)
CO2: 24 mmol/L (ref 22–32)
Calcium: 9.3 mg/dL (ref 8.9–10.3)
Chloride: 105 mmol/L (ref 98–111)
Creatinine, Ser: 1.36 mg/dL — ABNORMAL HIGH (ref 0.61–1.24)
GFR, Estimated: 59 mL/min — ABNORMAL LOW (ref >60)
Glucose, Bld: 93 mg/dL (ref 70–99)
Potassium: 4.2 mmol/L (ref 3.5–5.1)
Sodium: 137 mmol/L (ref 135–145)

## 2022-11-11 LAB — CBC
HCT: 41.9 % (ref 39.0–52.0)
Hemoglobin: 14.4 g/dL (ref 13.0–17.0)
MCH: 32 pg (ref 26.0–34.0)
MCHC: 34.4 g/dL (ref 30.0–36.0)
MCV: 93.1 fL (ref 80.0–100.0)
Platelets: 143 10*3/uL — ABNORMAL LOW (ref 150–400)
RBC: 4.5 MIL/uL (ref 4.22–5.81)
RDW: 11.9 % (ref 11.5–15.5)
WBC: 6.2 10*3/uL (ref 4.0–10.5)
nRBC: 0 % (ref 0.0–0.2)

## 2022-11-11 LAB — SURGICAL PCR SCREEN
MRSA, PCR: NEGATIVE
Staphylococcus aureus: NEGATIVE

## 2022-11-11 MED ORDER — VALACYCLOVIR HCL 1 G PO TABS
1000.0000 mg | ORAL_TABLET | Freq: Two times a day (BID) | ORAL | 0 refills | Status: DC
  Filled 2022-11-11: qty 30, 15d supply, fill #0

## 2022-11-11 NOTE — Progress Notes (Signed)
Office Note 11/11/2022  CC:  Chief Complaint  Patient presents with   Surgical Clearance    HPI:  Patient is a 62 y.o. male who is here for preoperative medical clearance.  Patient is scheduled for right total knee revision, femoral component only on 11/18/2022--> Dr. Charlann Boxer.  Jusitn feels well.  Monitors blood pressure daily at home and typically is in the 120s over 70s. Unable to exercise due to the right knee pain.  No shortness of breath, chest pain, dizziness, palpitations, nausea, or fevers. Energy level is good.  Past Medical History:  Diagnosis Date   Complication of anesthesia    during a colonoscopy pt. woke up during the procedure and also during a hand surgery    Duodenal ulcer 1983   GERD (gastroesophageal reflux disease)    Hay fever    History of adenomatous polyp of colon    2016 (Dr. Randa Evens, [facility])   History of blood product transfusion    Hypertension    PONV (postoperative nausea and vomiting)    Primary osteoarthritis of knees, bilateral    Shingles    Right lateral hip extending down over right thigh and knee, initial episode 04/2021, recurrence x1 after.  No postherpetic neuralgia.    Past Surgical History:  Procedure Laterality Date   ANTERIOR CERVICAL DECOMP/DISCECTOMY FUSION N/A 06/30/2017   Procedure: ANTERIOR CERVICAL DECOMPRESSION/DISCECTOMY FUSION C5-6;  Surgeon: Venita Lick, MD;  Location: [facility] OR;  Service: Orthopedics;  Laterality: N/A;  3 hrs   COLONOSCOPY  [id number]   09/02/14 polyps x 3 ([facility])   GANGLION CYST EXCISION Left 2006   HEEL SPUR SURGERY Right 2013   HEEL SPUR SURGERY Right    2nd surgery   INGUINAL HERNIA REPAIR Left 04/26/2014   Procedure: LEFT INGUINAL HERNIA REPAIR WITH MESH;  Surgeon: Abigail Miyamoto, MD;  Location: [facility] OR;  Service: General;  Laterality: Left;   INGUINAL HERNIA REPAIR Right 02/20/2016   Procedure: RIGHT INGUINAL HERNIA REPAIR WITH MESH;  Surgeon: Abigail Miyamoto, MD;  Location: [facility] OR;  Service:  General;  Laterality: Right;   INSERTION OF MESH Left 04/26/2014   Procedure: INSERTION OF MESH;  Surgeon: Abigail Miyamoto, MD;  Location: [facility] OR;  Service: General;  Laterality: Left;   INSERTION OF MESH Right 02/20/2016   Procedure: INSERTION OF MESH;  Surgeon: Abigail Miyamoto, MD;  Location: [facility] OR;  Service: General;  Laterality: Right;   KNEE ARTHROSCOPY Left 02/2015   left   KNEE ARTHROSCOPY Left 12/2015   KNEE ARTHROSCOPY Right    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   WRIST SURGERY      Family History  Problem Relation Age of Onset   Scoliosis Mother    Healthy Brother     Social History   Socioeconomic History   Marital status: Married    Spouse name: Not on file   Number of children: Not on file   Years of education: Not on file   Highest education level: 12th grade  Occupational History   Not on file  Tobacco Use   Smoking status: Former    Years: 17    Types: Cigarettes    Quit date: 1989    Years since quitting: 35.3    Passive exposure: Never   Smokeless tobacco: Never   Tobacco comments:    quit 1989  Vaping Use   Vaping Use: Never used  Substance and Sexual Activity   Alcohol use: Yes    Alcohol/week: 6.0 - 12.0 standard drinks of alcohol  Types: 6 - 12 Cans of beer per week    Comment: occasional   Drug use: No   Sexual activity: Not on file  Other Topics Concern   Not on file  Social History Narrative   Married,   Educ: HS   Occup: sales   Social Determinants of Health   Financial Resource Strain: Low Risk  (07/11/2022)   Overall Financial Resource Strain (CARDIA)    Difficulty of Paying Living Expenses: Not hard at all  Food Insecurity: No Food Insecurity (07/11/2022)   Hunger Vital Sign    Worried About Running Out of Food in the Last Year: Never true    Ran Out of Food in the Last Year: Never true  Transportation Needs: No Transportation Needs (07/11/2022)   PRAPARE - Administrator, Civil Service (Medical): No    Lack of  Transportation (Non-Medical): No  Physical Activity: Sufficiently Active (07/11/2022)   Exercise Vital Sign    Days of Exercise per Week: 5 days    Minutes of Exercise per Session: 30 min  Stress: No Stress Concern Present (07/11/2022)   [employer] of Occupational Health - Occupational Stress Questionnaire    Feeling of Stress : Only a little  Social Connections: Moderately Isolated (07/11/2022)   Social Connection and Isolation Panel [NHANES]    Frequency of Communication with Friends and Family: More than three times a week    Frequency of Social Gatherings with Friends and Family: Once a week    Attends Religious Services: Never    Database administrator or Organizations: No    Attends Engineer, structural: Not on file    Marital Status: Married  Catering manager Violence: Not on file    Outpatient Medications Prior to Visit  Medication Sig Dispense Refill   aspirin EC 81 MG tablet Take 81 mg by mouth daily. Swallow whole.     cetirizine (ZYRTEC) 10 MG tablet Take 10 mg by mouth daily as needed for allergies.     lisinopril (ZESTRIL) 5 MG tablet Take 1 tablet by mouth daily. 90 tablet 1   Multiple Vitamins-Minerals (MULTIVITAMIN PO) Take 1 tablet by mouth daily.      No facility-administered medications prior to visit.    Allergies  Allergen Reactions   Aspirin Other (See Comments)    High doses contraindicated due to ulcer   Codeine Nausea And Vomiting   Oxycodone-Acetaminophen Nausea And Vomiting    Review of Systems  Constitutional:  Negative for appetite change, chills, fatigue and fever.  HENT:  Negative for congestion, dental problem, ear pain and sore throat.   Eyes:  Negative for discharge, redness and visual disturbance.  Respiratory:  Negative for cough, chest tightness, shortness of breath and wheezing.   Cardiovascular:  Negative for chest pain, palpitations and leg swelling.  Gastrointestinal:  Negative for abdominal pain, blood in stool, diarrhea,  nausea and vomiting.  Genitourinary:  Negative for difficulty urinating, dysuria, flank pain, frequency, hematuria and urgency.  Musculoskeletal:  Positive for arthralgias (R knee), back pain (some compensatory low back pain) and joint swelling (R knee). Negative for myalgias and neck stiffness.  Skin:  Negative for pallor and rash.  Neurological:  Negative for dizziness, speech difficulty, weakness and headaches.  Hematological:  Negative for adenopathy. Does not bruise/bleed easily.  Psychiatric/Behavioral:  Negative for confusion and sleep disturbance. The patient is not nervous/anxious.     PE;    11/11/2022    3:24 PM 11/11/2022  2:19 PM 11/11/2022    2:00 PM  Vitals with BMI  Height 5\' 11"   5\' 11"   Weight 209 lbs 6 oz  224 lbs  BMI 29.22  31.26  Systolic 137 151 629  Diastolic 86 106 107  Pulse 73 79 65    Gen: Alert, well appearing.  Patient is oriented to person, place, time, and situation. AFFECT: pleasant, lucid thought and speech. ENT: Ears: EACs clear, normal epithelium.  TMs with good light reflex and landmarks bilaterally.  Eyes: no injection, icteris, swelling, or exudate.  EOMI, PERRLA. Nose: no drainage or turbinate edema/swelling.  No injection or focal lesion.  Mouth: lips without lesion/swelling.  Oral mucosa pink and moist.  Dentition intact and without obvious caries or gingival swelling.  Oropharynx without erythema, exudate, or swelling.  Neck: supple/nontender.  No LAD, mass, or TM.  Carotid pulses 2+ bilaterally, without bruits. CV: RRR, no m/r/g.   LUNGS: CTA bilat, nonlabored resps, good aeration in all lung fields. ABD: soft, NT, ND, BS normal.  No hepatospenomegaly or mass.  No bruits. EXT: no clubbing, cyanosis, or edema.  Musculoskeletal: Mild right knee effusion and warmth.  No erythema.  Range of motion mildly stiff and painful. Otherwise no joint swelling, erythema, warmth, or tenderness.  ROM of all joints intact. Skin - no sores or suspicious  lesions or rashes or color changes  Pertinent labs:   Lab Results  Component Value Date   WBC 6.2 11/11/2022   HGB 14.4 11/11/2022   HCT 41.9 11/11/2022   MCV 93.1 11/11/2022   PLT 143 (L) 11/11/2022   Lab Results  Component Value Date   CREATININE 1.36 (H) 11/11/2022   BUN 21 11/11/2022   NA 137 11/11/2022   K 4.2 11/11/2022   CL 105 11/11/2022   CO2 24 11/11/2022   Lab Results  Component Value Date   ALT 35 12/25/2021   AST 32 12/25/2021   ALKPHOS 72 12/25/2021   BILITOT 0.6 12/25/2021   Lab Results  Component Value Date   CHOL 190 12/25/2021   Lab Results  Component Value Date   HDL 51.20 12/25/2021   Lab Results  Component Value Date   LDLCALC 101 (H) 12/25/2021   Lab Results  Component Value Date   TRIG 191.0 (H) 12/25/2021   Lab Results  Component Value Date   CHOLHDL 4 12/25/2021   Lab Results  Component Value Date   PSA 0.60 12/25/2021   Lab Results  Component Value Date   HGBA1C 5.3 03/04/2021   ASSESSMENT AND PLAN:   Preoperative medical clearance-> 11/18/2022 right knee surgery. He is doing well. Blood pressure is optimally managed.  Continue lisinopril 5 mg a day. Okay to stop aspirin--no indication at this time. He got CBC, be met, and EKG at his preop visit this morning--> no results available yet.  History of shingles in right gluteal area only few days after his original knee surgery back in 2022. As a preventative measure we will use Valtrex 1 g twice daily starting now and continue 1 week after the surgery.  An After Visit Summary was printed and given to the patient.  FOLLOW UP:  Return for as needed.  Signed:  Santiago Bumpers, MD           11/11/2022

## 2022-11-11 NOTE — Progress Notes (Addendum)
For Short Stay: COVID SWAB appointment date:  Bowel Prep reminder:   For Anesthesia: PCP - Dr. Nicoletta Ba. LOV: 07/30/22 Cardiologist - N/A  Chest x-ray -  EKG - 11/11/22 Stress Test -  ECHO -  Cardiac Cath -  Pacemaker/ICD device last checked: Pacemaker orders received: Device Rep notified:  Spinal Cord Stimulator:  Sleep Study - N/A CPAP -   Fasting Blood Sugar - N/A Checks Blood Sugar _____ times a day Date and result of last Hgb A1c-  Last dose of GLP1 agonist- N/A GLP1 instructions:   Last dose of SGLT-2 inhibitors- N/A SGLT-2 instructions:   Blood Thinner Instructions: Aspirin Instructions: None. Last Dose:  Activity level: Can go up a flight of stairs and activities of daily living without stopping and without chest pain and/or shortness of breath   Able to exercise without chest pain and/or shortness of breath    Anesthesia review: Hx: HTN ,Elevated BP during PST: 153/107,151/106  Patient denies shortness of breath, fever, cough and chest pain at PAT appointment   Patient verbalized understanding of instructions that were given to them at the PAT appointment. Patient was also instructed that they will need to review over the PAT instructions again at home before surgery.

## 2022-11-12 DIAGNOSIS — T8484XA Pain due to internal orthopedic prosthetic devices, implants and grafts, initial encounter: Secondary | ICD-10-CM | POA: Diagnosis not present

## 2022-11-18 ENCOUNTER — Ambulatory Visit (HOSPITAL_COMMUNITY): Payer: Commercial Managed Care - PPO | Admitting: Physician Assistant

## 2022-11-18 ENCOUNTER — Ambulatory Visit (HOSPITAL_COMMUNITY): Payer: Commercial Managed Care - PPO | Admitting: Anesthesiology

## 2022-11-18 ENCOUNTER — Other Ambulatory Visit: Payer: Self-pay

## 2022-11-18 ENCOUNTER — Encounter (HOSPITAL_COMMUNITY): Payer: Self-pay | Admitting: Orthopedic Surgery

## 2022-11-18 ENCOUNTER — Encounter (HOSPITAL_COMMUNITY)
Admission: RE | Disposition: A | Discharge status: Home / Self Care | Payer: Self-pay | Source: Home / Self Care | Attending: Orthopedic Surgery

## 2022-11-18 ENCOUNTER — Observation Stay (HOSPITAL_COMMUNITY)
Admission: RE | Admit: 2022-11-18 | Discharge: 2022-11-19 | Disposition: A | Discharge status: Home / Self Care | Payer: Commercial Managed Care - PPO | Attending: Orthopedic Surgery | Admitting: Orthopedic Surgery

## 2022-11-18 DIAGNOSIS — Z86010 Personal history of colonic polyps: Secondary | ICD-10-CM | POA: Diagnosis not present

## 2022-11-18 DIAGNOSIS — Z79899 Other long term (current) drug therapy: Secondary | ICD-10-CM | POA: Diagnosis not present

## 2022-11-18 DIAGNOSIS — K219 Gastro-esophageal reflux disease without esophagitis: Secondary | ICD-10-CM | POA: Diagnosis present

## 2022-11-18 DIAGNOSIS — Y792 Prosthetic and other implants, materials and accessory orthopedic devices associated with adverse incidents: Secondary | ICD-10-CM | POA: Diagnosis present

## 2022-11-18 DIAGNOSIS — Z8711 Personal history of peptic ulcer disease: Secondary | ICD-10-CM

## 2022-11-18 DIAGNOSIS — Z885 Allergy status to narcotic agent status: Secondary | ICD-10-CM

## 2022-11-18 DIAGNOSIS — G8918 Other acute postprocedural pain: Secondary | ICD-10-CM | POA: Diagnosis not present

## 2022-11-18 DIAGNOSIS — Z96651 Presence of right artificial knee joint: Secondary | ICD-10-CM

## 2022-11-18 DIAGNOSIS — M170 Bilateral primary osteoarthritis of knee: Secondary | ICD-10-CM | POA: Diagnosis not present

## 2022-11-18 DIAGNOSIS — T84092A Other mechanical complication of internal right knee prosthesis, initial encounter: Principal | ICD-10-CM | POA: Insufficient documentation

## 2022-11-18 DIAGNOSIS — Z7982 Long term (current) use of aspirin: Secondary | ICD-10-CM | POA: Insufficient documentation

## 2022-11-18 DIAGNOSIS — Z886 Allergy status to analgesic agent status: Secondary | ICD-10-CM

## 2022-11-18 DIAGNOSIS — I10 Essential (primary) hypertension: Secondary | ICD-10-CM

## 2022-11-18 DIAGNOSIS — Z981 Arthrodesis status: Secondary | ICD-10-CM | POA: Diagnosis not present

## 2022-11-18 DIAGNOSIS — Y828 Other medical devices associated with adverse incidents: Secondary | ICD-10-CM | POA: Diagnosis not present

## 2022-11-18 DIAGNOSIS — T84012A Broken internal right knee prosthesis, initial encounter: Secondary | ICD-10-CM

## 2022-11-18 DIAGNOSIS — Z8619 Personal history of other infectious and parasitic diseases: Secondary | ICD-10-CM | POA: Diagnosis not present

## 2022-11-18 DIAGNOSIS — T84022A Instability of internal right knee prosthesis, initial encounter: Secondary | ICD-10-CM | POA: Diagnosis not present

## 2022-11-18 DIAGNOSIS — Z87891 Personal history of nicotine dependence: Secondary | ICD-10-CM

## 2022-11-18 HISTORY — PX: TOTAL KNEE REVISION: SHX996

## 2022-11-18 SURGERY — TOTAL KNEE REVISION
Anesthesia: Monitor Anesthesia Care | Site: Knee | Laterality: Right

## 2022-11-18 MED ORDER — LORATADINE 10 MG PO TABS: 10.0000 mg | ORAL_TABLET | Freq: Every day | ORAL | Status: DC

## 2022-11-18 MED ORDER — POLYETHYLENE GLYCOL 3350 17 G PO PACK
17.0000 g | PACK | Freq: Two times a day (BID) | ORAL | Status: DC
  Administered 2022-11-18: 17 g via ORAL
  Filled 2022-11-18: qty 1

## 2022-11-18 MED ORDER — ONDANSETRON HCL 4 MG/2ML IJ SOLN: 4.0000 mg | Freq: Once | INTRAMUSCULAR | Status: DC | PRN

## 2022-11-18 MED ORDER — CEFAZOLIN SODIUM-DEXTROSE 2-4 GM/100ML-% IV SOLN
2.0000 g | Freq: Four times a day (QID) | INTRAVENOUS | Status: AC
  Administered 2022-11-18 (×2): 2 g via INTRAVENOUS
  Filled 2022-11-18 (×2): qty 100

## 2022-11-18 MED ORDER — CEFAZOLIN SODIUM-DEXTROSE 2-4 GM/100ML-% IV SOLN
2.0000 g | INTRAVENOUS | Status: AC
  Administered 2022-11-18: 2 g via INTRAVENOUS
  Filled 2022-11-18: qty 100

## 2022-11-18 MED ORDER — CHLORHEXIDINE GLUCONATE 0.12 % MT SOLN
15.0000 mL | Freq: Once | OROMUCOSAL | Status: AC
  Administered 2022-11-18: 15 mL via OROMUCOSAL

## 2022-11-18 MED ORDER — PROPOFOL 500 MG/50ML IV EMUL
INTRAVENOUS | Status: DC | PRN
  Administered 2022-11-18: 50 ug/kg/min via INTRAVENOUS
  Administered 2022-11-18: 200 mg via INTRAVENOUS

## 2022-11-18 MED ORDER — TRAMADOL HCL 50 MG PO TABS
50.0000 mg | ORAL_TABLET | Freq: Four times a day (QID) | ORAL | Status: DC | PRN
  Administered 2022-11-18: 50 mg via ORAL
  Administered 2022-11-19: 100 mg via ORAL
  Filled 2022-11-18: qty 2

## 2022-11-18 MED ORDER — KETOROLAC TROMETHAMINE 30 MG/ML IJ SOLN
INTRAMUSCULAR | Status: AC
  Filled 2022-11-18: qty 1

## 2022-11-18 MED ORDER — FENTANYL CITRATE PF 50 MCG/ML IJ SOSY
PREFILLED_SYRINGE | INTRAMUSCULAR | Status: AC
  Filled 2022-11-18: qty 1

## 2022-11-18 MED ORDER — SODIUM CHLORIDE (PF) 0.9 % IJ SOLN
INTRAMUSCULAR | Status: DC | PRN
  Administered 2022-11-18: 10 mL

## 2022-11-18 MED ORDER — MENTHOL 3 MG MT LOZG: 1.0000 | LOZENGE | OROMUCOSAL | Status: DC | PRN

## 2022-11-18 MED ORDER — LACTATED RINGERS IV SOLN: INTRAVENOUS | Status: DC

## 2022-11-18 MED ORDER — PHENOL 1.4 % MT LIQD: 1.0000 | OROMUCOSAL | Status: DC | PRN

## 2022-11-18 MED ORDER — TRAMADOL HCL 50 MG PO TABS
ORAL_TABLET | ORAL | Status: AC
  Filled 2022-11-18: qty 1

## 2022-11-18 MED ORDER — DOCUSATE SODIUM 100 MG PO CAPS
100.0000 mg | ORAL_CAPSULE | Freq: Two times a day (BID) | ORAL | Status: DC
  Administered 2022-11-18 – 2022-11-19 (×2): 100 mg via ORAL
  Filled 2022-11-18 (×2): qty 1

## 2022-11-18 MED ORDER — METHOCARBAMOL 500 MG IVPB - SIMPLE MED
INTRAVENOUS | Status: AC
  Filled 2022-11-18: qty 55

## 2022-11-18 MED ORDER — PHENYLEPHRINE 80 MCG/ML (10ML) SYRINGE FOR IV PUSH (FOR BLOOD PRESSURE SUPPORT)
PREFILLED_SYRINGE | INTRAVENOUS | Status: AC
  Filled 2022-11-18: qty 10

## 2022-11-18 MED ORDER — KETAMINE HCL 50 MG/5ML IJ SOSY
PREFILLED_SYRINGE | INTRAMUSCULAR | Status: AC
  Filled 2022-11-18: qty 5

## 2022-11-18 MED ORDER — PHENYLEPHRINE HCL (PRESSORS) 10 MG/ML IV SOLN
INTRAVENOUS | Status: AC
  Filled 2022-11-18: qty 1

## 2022-11-18 MED ORDER — TRANEXAMIC ACID-NACL 1000-0.7 MG/100ML-% IV SOLN
1000.0000 mg | INTRAVENOUS | Status: AC
  Administered 2022-11-18: 1000 mg via INTRAVENOUS
  Filled 2022-11-18: qty 100

## 2022-11-18 MED ORDER — ONDANSETRON HCL 4 MG/2ML IJ SOLN
INTRAMUSCULAR | Status: DC | PRN
  Administered 2022-11-18: 4 mg via INTRAVENOUS

## 2022-11-18 MED ORDER — DEXAMETHASONE SODIUM PHOSPHATE 10 MG/ML IJ SOLN
10.0000 mg | Freq: Once | INTRAMUSCULAR | Status: AC
  Administered 2022-11-19: 10 mg via INTRAVENOUS
  Filled 2022-11-18: qty 1

## 2022-11-18 MED ORDER — 0.9 % SODIUM CHLORIDE (POUR BTL) OPTIME
TOPICAL | Status: DC | PRN
  Administered 2022-11-18: 1000 mL

## 2022-11-18 MED ORDER — MIDAZOLAM HCL 2 MG/2ML IJ SOLN
2.0000 mg | INTRAMUSCULAR | Status: DC
  Administered 2022-11-18: 2 mg via INTRAVENOUS
  Filled 2022-11-18: qty 2

## 2022-11-18 MED ORDER — EPINEPHRINE PF 1 MG/ML IJ SOLN
INTRAMUSCULAR | Status: AC
  Filled 2022-11-18: qty 1

## 2022-11-18 MED ORDER — ACETAMINOPHEN 500 MG PO TABS
1000.0000 mg | ORAL_TABLET | Freq: Four times a day (QID) | ORAL | Status: AC
  Administered 2022-11-18 – 2022-11-19 (×4): 1000 mg via ORAL
  Filled 2022-11-18 (×4): qty 2

## 2022-11-18 MED ORDER — LISINOPRIL 5 MG PO TABS: 5.0000 mg | ORAL_TABLET | Freq: Every day | ORAL | Status: DC

## 2022-11-18 MED ORDER — PHENYLEPHRINE HCL (PRESSORS) 10 MG/ML IV SOLN
INTRAVENOUS | Status: DC | PRN
  Administered 2022-11-18 (×2): 80 ug via INTRAVENOUS

## 2022-11-18 MED ORDER — POVIDONE-IODINE 10 % EX SWAB: 2.0000 | Freq: Once | CUTANEOUS | Status: DC

## 2022-11-18 MED ORDER — SODIUM CHLORIDE (PF) 0.9 % IJ SOLN
INTRAMUSCULAR | Status: AC
  Filled 2022-11-18: qty 30

## 2022-11-18 MED ORDER — KETAMINE HCL 10 MG/ML IJ SOLN
INTRAMUSCULAR | Status: DC | PRN
  Administered 2022-11-18: 30 mg via INTRAVENOUS

## 2022-11-18 MED ORDER — BISACODYL 10 MG RE SUPP: 10.0000 mg | Freq: Every day | RECTAL | Status: DC | PRN

## 2022-11-18 MED ORDER — ACETAMINOPHEN 325 MG PO TABS: 325.0000 mg | ORAL_TABLET | ORAL | Status: DC | PRN

## 2022-11-18 MED ORDER — FENTANYL CITRATE PF 50 MCG/ML IJ SOSY
PREFILLED_SYRINGE | INTRAMUSCULAR | Status: AC
  Filled 2022-11-18: qty 2

## 2022-11-18 MED ORDER — OXYCODONE HCL 5 MG/5ML PO SOLN: 5.0000 mg | Freq: Once | ORAL | Status: DC | PRN

## 2022-11-18 MED ORDER — MEPERIDINE HCL 50 MG/ML IJ SOLN: 6.2500 mg | INTRAMUSCULAR | Status: DC | PRN

## 2022-11-18 MED ORDER — BUPIVACAINE HCL 0.25 % IJ SOLN
INTRAMUSCULAR | Status: AC
  Filled 2022-11-18: qty 1

## 2022-11-18 MED ORDER — ONDANSETRON HCL 4 MG PO TABS: 4.0000 mg | ORAL_TABLET | Freq: Four times a day (QID) | ORAL | Status: DC | PRN

## 2022-11-18 MED ORDER — LACTATED RINGERS IV SOLN: INTRAVENOUS | Status: DC | PRN

## 2022-11-18 MED ORDER — EPHEDRINE SULFATE (PRESSORS) 50 MG/ML IJ SOLN
INTRAMUSCULAR | Status: DC | PRN
  Administered 2022-11-18: 20 mg via INTRAVENOUS

## 2022-11-18 MED ORDER — FENTANYL CITRATE (PF) 100 MCG/2ML IJ SOLN
INTRAMUSCULAR | Status: AC
  Filled 2022-11-18: qty 2

## 2022-11-18 MED ORDER — ROPIVACAINE HCL 5 MG/ML IJ SOLN
INTRAMUSCULAR | Status: DC | PRN
  Administered 2022-11-18: 20 mL via PERINEURAL

## 2022-11-18 MED ORDER — PHENYLEPHRINE HCL-NACL 20-0.9 MG/250ML-% IV SOLN
INTRAVENOUS | Status: DC | PRN
  Administered 2022-11-18: 25 ug/min via INTRAVENOUS

## 2022-11-18 MED ORDER — PROPOFOL 500 MG/50ML IV EMUL
INTRAVENOUS | Status: AC
  Filled 2022-11-18: qty 50

## 2022-11-18 MED ORDER — DIPHENHYDRAMINE HCL 12.5 MG/5ML PO ELIX: 12.5000 mg | ORAL_SOLUTION | ORAL | Status: DC | PRN

## 2022-11-18 MED ORDER — SCOPOLAMINE 1 MG/3DAYS TD PT72
MEDICATED_PATCH | TRANSDERMAL | Status: AC
  Filled 2022-11-18: qty 1

## 2022-11-18 MED ORDER — EPINEPHRINE PF 1 MG/ML IJ SOLN
INTRAMUSCULAR | Status: DC | PRN
  Administered 2022-11-18: .15 mL

## 2022-11-18 MED ORDER — OXYCODONE HCL 5 MG PO TABS: 5.0000 mg | ORAL_TABLET | Freq: Once | ORAL | Status: DC | PRN

## 2022-11-18 MED ORDER — ACETAMINOPHEN 160 MG/5ML PO SOLN: 325.0000 mg | ORAL | Status: DC | PRN

## 2022-11-18 MED ORDER — DEXAMETHASONE SODIUM PHOSPHATE 10 MG/ML IJ SOLN
8.0000 mg | Freq: Once | INTRAMUSCULAR | Status: AC
  Administered 2022-11-18: 8 mg via INTRAVENOUS

## 2022-11-18 MED ORDER — SODIUM CHLORIDE 0.9 % IV SOLN: INTRAVENOUS | Status: DC

## 2022-11-18 MED ORDER — ASPIRIN 81 MG PO CHEW
81.0000 mg | CHEWABLE_TABLET | Freq: Two times a day (BID) | ORAL | Status: DC
  Administered 2022-11-18 – 2022-11-19 (×2): 81 mg via ORAL
  Filled 2022-11-18 (×2): qty 1

## 2022-11-18 MED ORDER — SCOPOLAMINE 1 MG/3DAYS TD PT72
1.0000 | MEDICATED_PATCH | TRANSDERMAL | Status: DC
  Administered 2022-11-18: 1.5 mg via TRANSDERMAL

## 2022-11-18 MED ORDER — FENTANYL CITRATE PF 50 MCG/ML IJ SOSY
25.0000 ug | PREFILLED_SYRINGE | INTRAMUSCULAR | Status: DC | PRN
  Administered 2022-11-18 (×3): 50 ug via INTRAVENOUS

## 2022-11-18 MED ORDER — DEXAMETHASONE SODIUM PHOSPHATE 10 MG/ML IJ SOLN
INTRAMUSCULAR | Status: AC
  Filled 2022-11-18: qty 1

## 2022-11-18 MED ORDER — BUPIVACAINE HCL (PF) 0.25 % IJ SOLN
INTRAMUSCULAR | Status: DC | PRN
  Administered 2022-11-18: 30 mL

## 2022-11-18 MED ORDER — HYDROMORPHONE HCL 1 MG/ML IJ SOLN
0.5000 mg | INTRAMUSCULAR | Status: DC | PRN
  Administered 2022-11-18: 1 mg via INTRAVENOUS
  Filled 2022-11-18: qty 1

## 2022-11-18 MED ORDER — VALACYCLOVIR HCL 500 MG PO TABS
1000.0000 mg | ORAL_TABLET | Freq: Two times a day (BID) | ORAL | Status: DC
  Filled 2022-11-18 (×2): qty 2

## 2022-11-18 MED ORDER — METHOCARBAMOL 500 MG IVPB - SIMPLE MED
500.0000 mg | Freq: Four times a day (QID) | INTRAVENOUS | Status: DC | PRN
  Administered 2022-11-18: 500 mg via INTRAVENOUS

## 2022-11-18 MED ORDER — FENTANYL CITRATE (PF) 100 MCG/2ML IJ SOLN
INTRAMUSCULAR | Status: DC | PRN
  Administered 2022-11-18: 50 ug via INTRAVENOUS

## 2022-11-18 MED ORDER — ONDANSETRON HCL 4 MG/2ML IJ SOLN
INTRAMUSCULAR | Status: AC
  Filled 2022-11-18: qty 2

## 2022-11-18 MED ORDER — ONDANSETRON HCL 4 MG/2ML IJ SOLN: 4.0000 mg | Freq: Four times a day (QID) | INTRAMUSCULAR | Status: DC | PRN

## 2022-11-18 MED ORDER — HYDROMORPHONE HCL 2 MG PO TABS
2.0000 mg | ORAL_TABLET | ORAL | Status: DC | PRN
  Administered 2022-11-18: 4 mg via ORAL
  Administered 2022-11-19: 2 mg via ORAL
  Filled 2022-11-18: qty 2
  Filled 2022-11-18: qty 1

## 2022-11-18 MED ORDER — TRANEXAMIC ACID-NACL 1000-0.7 MG/100ML-% IV SOLN
1000.0000 mg | Freq: Once | INTRAVENOUS | Status: AC
  Administered 2022-11-18: 1000 mg via INTRAVENOUS
  Filled 2022-11-18: qty 100

## 2022-11-18 MED ORDER — METOCLOPRAMIDE HCL 5 MG PO TABS: 5.0000 mg | ORAL_TABLET | Freq: Three times a day (TID) | ORAL | Status: DC | PRN

## 2022-11-18 MED ORDER — METOCLOPRAMIDE HCL 5 MG/ML IJ SOLN: 5.0000 mg | Freq: Three times a day (TID) | INTRAMUSCULAR | Status: DC | PRN

## 2022-11-18 MED ORDER — METHOCARBAMOL 500 MG PO TABS
500.0000 mg | ORAL_TABLET | Freq: Four times a day (QID) | ORAL | Status: DC | PRN
  Administered 2022-11-18: 500 mg via ORAL
  Filled 2022-11-18: qty 1

## 2022-11-18 MED ORDER — ORAL CARE MOUTH RINSE: 15.0000 mL | Freq: Once | OROMUCOSAL | Status: AC

## 2022-11-18 MED ORDER — SODIUM CHLORIDE 0.9 % IR SOLN
Status: DC | PRN
  Administered 2022-11-18: 1000 mL

## 2022-11-18 MED ORDER — FENTANYL CITRATE PF 50 MCG/ML IJ SOSY
100.0000 ug | PREFILLED_SYRINGE | INTRAMUSCULAR | Status: DC
  Administered 2022-11-18: 100 ug via INTRAVENOUS
  Filled 2022-11-18: qty 2

## 2022-11-18 SURGICAL SUPPLY — 70 items
ADH SKN CLS APL DERMABOND .7 (GAUZE/BANDAGES/DRESSINGS) ×1
ADH SKN CLS LQ APL DERMABOND (GAUZE/BANDAGES/DRESSINGS) ×1
ATTUNE PSRP INSR SZ7 8 KNEE (Insert) IMPLANT
AUG FEM SZ7 8 REV POST STRL LF (Miscellaneous) ×2 IMPLANT
AUGMENT POST FEM SZ7 8 (Miscellaneous) IMPLANT
BAG COUNTER SPONGE SURGICOUNT (BAG) IMPLANT
BAG DECANTER FOR FLEXI CONT (MISCELLANEOUS) IMPLANT
BAG SPEC THK2 15X12 ZIP CLS (MISCELLANEOUS)
BAG SPNG CNTER NS LX DISP (BAG)
BAG ZIPLOCK 12X15 (MISCELLANEOUS) IMPLANT
BLADE SAW SGTL 11.0X1.19X90.0M (BLADE) IMPLANT
BLADE SAW SGTL 13.0X1.19X90.0M (BLADE) ×2 IMPLANT
BLADE SAW SGTL 81X20 HD (BLADE) ×2 IMPLANT
BLADE SURG SZ10 CARB STEEL (BLADE) ×4 IMPLANT
BNDG CMPR 5X62 HK CLSR LF (GAUZE/BANDAGES/DRESSINGS) ×1
BNDG CMPR MED 10X6 ELC LF (GAUZE/BANDAGES/DRESSINGS) ×1
BNDG ELASTIC 6INX 5YD STR LF (GAUZE/BANDAGES/DRESSINGS) ×2 IMPLANT
BNDG ELASTIC 6X10 VLCR STRL LF (GAUZE/BANDAGES/DRESSINGS) IMPLANT
BRUSH FEMORAL CANAL (MISCELLANEOUS) IMPLANT
CEMENT HV SMART SET (Cement) IMPLANT
CEMENT RESTRICTOR DEPUY SZ 6 (Cement) IMPLANT
COMP FEM ATTUNE CRS SZ7 RT (Femur) ×1 IMPLANT
COMPONENT FEM ATN CRS SZ7 RT (Femur) IMPLANT
CONE REV ATTUNE KNEE MED (Knees) IMPLANT
COVER SURGICAL LIGHT HANDLE (MISCELLANEOUS) ×2 IMPLANT
CUFF TOURN SGL QUICK 34 (TOURNIQUET CUFF) ×1
CUFF TRNQT CYL 34X4.125X (TOURNIQUET CUFF) ×2 IMPLANT
DERMABOND ADVANCED .7 DNX12 (GAUZE/BANDAGES/DRESSINGS) ×2 IMPLANT
DERMABOND ADVANCED .7 DNX6 (GAUZE/BANDAGES/DRESSINGS) IMPLANT
DRAPE INCISE IOBAN 66X45 STRL (DRAPES) ×2 IMPLANT
DRAPE U-SHAPE 47X51 STRL (DRAPES) ×2 IMPLANT
DRESSING AQUACEL AG SP 3.5X10 (GAUZE/BANDAGES/DRESSINGS) IMPLANT
DRSG AQUACEL AG ADV 3.5X10 (GAUZE/BANDAGES/DRESSINGS) ×2 IMPLANT
DRSG AQUACEL AG SP 3.5X10 (GAUZE/BANDAGES/DRESSINGS)
DURAPREP 26ML APPLICATOR (WOUND CARE) ×4 IMPLANT
ELECT REM PT RETURN 15FT ADLT (MISCELLANEOUS) ×2 IMPLANT
GAUZE SPONGE 2X2 8PLY STRL LF (GAUZE/BANDAGES/DRESSINGS) IMPLANT
GLOVE BIO SURGEON STRL SZ 6 (GLOVE) ×2 IMPLANT
GLOVE BIOGEL PI IND STRL 6.5 (GLOVE) ×2 IMPLANT
GLOVE BIOGEL PI IND STRL 7.5 (GLOVE) ×2 IMPLANT
GLOVE ORTHO TXT STRL SZ7.5 (GLOVE) ×4 IMPLANT
GOWN STRL REUS W/ TWL LRG LVL3 (GOWN DISPOSABLE) ×4 IMPLANT
GOWN STRL REUS W/TWL LRG LVL3 (GOWN DISPOSABLE) ×2
HANDPIECE INTERPULSE COAX TIP (DISPOSABLE) ×1
HOLDER FOLEY CATH W/STRAP (MISCELLANEOUS) IMPLANT
KIT TURNOVER KIT A (KITS) IMPLANT
MANIFOLD NEPTUNE II (INSTRUMENTS) ×2 IMPLANT
NDL SAFETY ECLIP 18X1.5 (MISCELLANEOUS) IMPLANT
NS IRRIG 1000ML POUR BTL (IV SOLUTION) ×2 IMPLANT
PACK TOTAL KNEE CUSTOM (KITS) ×2 IMPLANT
PIN FIX SIGMA LCS THRD HI (PIN) IMPLANT
PROTECTOR NERVE ULNAR (MISCELLANEOUS) ×2 IMPLANT
SET HNDPC FAN SPRY TIP SCT (DISPOSABLE) ×2 IMPLANT
SET PAD KNEE POSITIONER (MISCELLANEOUS) ×2 IMPLANT
SOLUTION IRRIG SURGIPHOR (IV SOLUTION) IMPLANT
SPIKE FLUID TRANSFER (MISCELLANEOUS) IMPLANT
STAPLER VISISTAT 35W (STAPLE) IMPLANT
STEM REV CEMENTED 14X50MM (Stem) IMPLANT
SUT MNCRL AB 3-0 PS2 18 (SUTURE) ×2 IMPLANT
SUT STRATAFIX PDS+ 0 24IN (SUTURE) ×2 IMPLANT
SUT VIC AB 1 CT1 36 (SUTURE) ×2 IMPLANT
SUT VIC AB 2-0 CT1 27 (SUTURE) ×3
SUT VIC AB 2-0 CT1 TAPERPNT 27 (SUTURE) ×6 IMPLANT
SYR 50ML LL SCALE MARK (SYRINGE) IMPLANT
TOWER CARTRIDGE SMART MIX (DISPOSABLE) ×2 IMPLANT
TRAY FOLEY MTR SLVR 16FR STAT (SET/KITS/TRAYS/PACK) ×2 IMPLANT
TUBE KAMVAC SUCTION (TUBING) IMPLANT
TUBE SUCTION HIGH CAP CLEAR NV (SUCTIONS) ×2 IMPLANT
WATER STERILE IRR 1000ML POUR (IV SOLUTION) ×2 IMPLANT
WRAP KNEE MAXI GEL POST OP (GAUZE/BANDAGES/DRESSINGS) ×2 IMPLANT

## 2022-11-18 NOTE — H&P (Signed)
TOTAL KNEE REVISION ADMISSION H&P  Patient is being admitted for right revision total knee arthroplasty.  Subjective:  Chief Complaint:right knee pain  HPI: Chase Lowe, 62 y.o. male. He has a history of right TKA in October 2022 by Dr. Thomasena Edis. He underwent an arthroscopic scar excision in September 2023. He has not felt that the knee ever improved. His knee was aspirated and sent for synovasure with negative results. It was then aspirated again in the office pre-op with normal appearing fluid. Decision was made for revision surgery to address flexion instability.   Patient Active Problem List   Diagnosis Date Noted   S/P cervical spinal fusion 06/30/2017   Pain in right wrist 08/02/2016   Past Medical History:  Diagnosis Date   Complication of anesthesia    during a colonoscopy pt. woke up during the procedure and also during a hand surgery    Duodenal ulcer 1983   GERD (gastroesophageal reflux disease)    Hay fever    History of adenomatous polyp of colon    2016 (Dr. Meyer Russel)   History of blood product transfusion    Hypertension    PONV (postoperative nausea and vomiting)    Primary osteoarthritis of knees, bilateral    Shingles    Right lateral hip extending down over right thigh and knee, initial episode 04/2021, recurrence x1 after.  No postherpetic neuralgia.    Past Surgical History:  Procedure Laterality Date   ANTERIOR CERVICAL DECOMP/DISCECTOMY FUSION N/A 06/30/2017   Procedure: ANTERIOR CERVICAL DECOMPRESSION/DISCECTOMY FUSION C5-6;  Surgeon: Venita Lick, MD;  Location: [facility] OR;  Service: Orthopedics;  Laterality: N/A;  3 hrs   COLONOSCOPY  [id number]   09/02/14 polyps x 3 ([facility])   GANGLION CYST EXCISION Left 2006   HEEL SPUR SURGERY Right 2013   HEEL SPUR SURGERY Right    2nd surgery   INGUINAL HERNIA REPAIR Left 04/26/2014   Procedure: LEFT INGUINAL HERNIA REPAIR WITH MESH;  Surgeon: Abigail Miyamoto, MD;  Location: [facility] OR;  Service: General;   Laterality: Left;   INGUINAL HERNIA REPAIR Right 02/20/2016   Procedure: RIGHT INGUINAL HERNIA REPAIR WITH MESH;  Surgeon: Abigail Miyamoto, MD;  Location: [facility] OR;  Service: General;  Laterality: Right;   INSERTION OF MESH Left 04/26/2014   Procedure: INSERTION OF MESH;  Surgeon: Abigail Miyamoto, MD;  Location: [facility] OR;  Service: General;  Laterality: Left;   INSERTION OF MESH Right 02/20/2016   Procedure: INSERTION OF MESH;  Surgeon: Abigail Miyamoto, MD;  Location: [facility] OR;  Service: General;  Laterality: Right;   KNEE ARTHROSCOPY Left 02/2015   left   KNEE ARTHROSCOPY Left 12/2015   KNEE ARTHROSCOPY Right    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   WRIST SURGERY      No current facility-administered medications for this encounter.   Current Outpatient Medications  Medication Sig Dispense Refill Last Dose   aspirin EC 81 MG tablet Take 81 mg by mouth daily. Swallow whole.      cetirizine (ZYRTEC) 10 MG tablet Take 10 mg by mouth daily as needed for allergies.      lisinopril (ZESTRIL) 5 MG tablet Take 1 tablet by mouth daily. 90 tablet 1    Multiple Vitamins-Minerals (MULTIVITAMIN PO) Take 1 tablet by mouth daily.       valACYclovir (VALTREX) 1000 MG tablet Take 1 tablet (1,000 mg total) by mouth 2 (two) times daily for 15 days 30 tablet 0    Allergies  Allergen Reactions  Aspirin Other (See Comments)    High doses contraindicated due to ulcer   Codeine Nausea And Vomiting   Oxycodone-Acetaminophen Nausea And Vomiting    Social History   Tobacco Use   Smoking status: Former    Years: 17    Types: Cigarettes    Quit date: 1989    Years since quitting: 35.3    Passive exposure: Never   Smokeless tobacco: Never   Tobacco comments:    quit 1989  Substance Use Topics   Alcohol use: Yes    Alcohol/week: 6.0 - 12.0 standard drinks of alcohol    Types: 6 - 12 Cans of beer per week    Comment: occasional    Family History  Problem Relation Age of Onset   Scoliosis Mother     Healthy Brother       Review of Systems  Constitutional:  Negative for chills and fever.  Respiratory:  Negative for cough and shortness of breath.   Cardiovascular:  Negative for chest pain.  Gastrointestinal:  Negative for nausea and vomiting.  Musculoskeletal:  Positive for arthralgias.      Objective:  Physical Exam Well nourished and well developed. General: Alert and oriented x3, cooperative and pleasant, no acute distress. Head: normocephalic, atraumatic, neck supple. Eyes: EOMI.  Musculoskeletal: Right knee exam: His surgical incision is healed without signs of infection Mild palpable effusion today No warmth or erythema He has full knee extension without evidence of obvious hyperextension and no obvious flexion contracture with flexion to about 110 degrees Assessment of his ligaments reveals that he has stable intact medial and lateral collateral ligaments and 10 degrees of flexion however he does appear to have some excessive plate and flexion both with anterior posterior stress as well as medial lateral Mild tenderness over the anterior medial aspect knee No obvious crepitation or clunking  Calves soft and nontender. Motor function intact in LE. Strength 5/5 LE bilaterally. Neuro: Distal pulses 2+. Sensation to light touch intact in LE.  Vital signs in last 24 hours:    Labs:  Estimated body mass index is 29.21 kg/m as calculated from the following:   Height as of 11/11/22: 5\' 11"  (1.803 m).   Weight as of 11/11/22: 95 kg.  Imaging Review Imaging: Standing AP and lateral of the right knee were ordered and reviewed today. His femoral and tibial components appear to be stable, well-fixed and sized. No evidence of component complicating issues. His patella appears to be stable in good position on the lateral radiograph.  MRI of his right knee was performed on 12/26/2021. At the time of that evaluation there was no evidence of any complication related to the  components or extensor mechanism.  Laboratory studies: In July 2023 Dr. Thomasena Edis had ordered a sedimentation rate and C-reactive protein. C-reactive protein was 0.9 with a normal less than 8.0. His sedimentation rate was 2 with a high normal of 20.    Assessment/Plan:  Failed right total knee arthroplasty   The patient history, physical examination, clinical judgment of the provider and imaging studies are consistent with failed total knee of the right knee(s), previous total knee arthroplasty. Revision total knee arthroplasty is deemed medically necessary. The treatment options including medical management, injection therapy, arthroscopy and revision arthroplasty were discussed at length. The risks and benefits of revision total knee arthroplasty were presented and reviewed. The risks due to aseptic loosening, infection, stiffness, patella tracking problems, thromboembolic complications and other imponderables were discussed. The patient acknowledged the explanation,  agreed to proceed with the plan and consent was signed. Patient is being admitted for inpatient treatment for surgery, pain control, PT, OT, prophylactic antibiotics, VTE prophylaxis, progressive ambulation and ADL's and discharge planning.The patient is planning to be discharged  home.   Therapy Plans: outpatient therapy at [facility] [place] Disposition: Home with wife Planned DVT Prophylaxis: aspirin 81mg  BID DME needed: none PCP: Dr. Milinda Cave (saw him yesterday and was verbally cleared) TXA: Allergies: codeine - nausea Anesthesia Concerns: N/V BMI: 28.5 Last HgbA1c: Not diabetic   Other: - Had a shingles outbreak 4 days after right TKA >> plan for taking valtrex before and after (he will let me know dosage) - Has ice machine - No hx of VTE or cancer - tramadol/hydromorphone (n/v), robaxin, tylenol, celebrex - Wife works at [employer] in behavioral health  Rosalene Billings, PA-C Orthopedic Surgery [facility] [place] [phone]

## 2022-11-18 NOTE — Anesthesia Procedure Notes (Signed)
Spinal  Patient location during procedure: OR Start time: 11/18/2022 10:30 AM Reason for block: surgical anesthesia Staffing Resident/CRNA: Deri Fuelling, CRNA Performed by: Deri Fuelling, CRNA Authorized by: Bethena Midget, MD   Preanesthetic Checklist Completed: patient identified, IV checked, site marked, risks and benefits discussed, surgical consent, monitors and equipment checked, pre-op evaluation and timeout performed Spinal Block Patient position: sitting Prep: DuraPrep Patient monitoring: heart rate, cardiac monitor, continuous pulse ox and blood pressure Approach: midline Location: L3-4 Injection technique: single-shot Needle Needle type: Pencan  Needle gauge: 24 G Needle length: 9 cm Assessment Events: CSF return

## 2022-11-18 NOTE — Interval H&P Note (Signed)
History and Physical Interval Note:  11/18/2022 8:52 AM  Chase Lowe  has presented today for surgery, with the diagnosis of Right total knee arthroplasty flexion instability.  The various methods of treatment have been discussed with the patient and family. After consideration of risks, benefits and other options for treatment, the patient has consented to  Procedure(s): TOTAL KNEE REVISION FEMORAL COMPONENT ONLY (Right) as a surgical intervention.  The patient's history has been reviewed, patient examined, no change in status, stable for surgery.  I have reviewed the patient's chart and labs.  Questions were answered to the patient's satisfaction.     Shelda Pal

## 2022-11-18 NOTE — Anesthesia Postprocedure Evaluation (Signed)
Anesthesia Post Note  Patient: Chase Lowe  Procedure(s) Performed: TOTAL KNEE REVISION FEMORAL COMPONENT ONLY (Right: Knee)     Patient location during evaluation: PACU Anesthesia Type: Regional and General Level of consciousness: awake and alert Pain management: pain level controlled Vital Signs Assessment: post-procedure vital signs reviewed and stable Respiratory status: spontaneous breathing, nonlabored ventilation, respiratory function stable and patient connected to nasal cannula oxygen Cardiovascular status: blood pressure returned to baseline and stable Postop Assessment: no apparent nausea or vomiting Anesthetic complications: no   No notable events documented.  Last Vitals:  Vitals:   11/18/22 1245 11/18/22 1300  BP: 95/69 108/74  Pulse:  70  Resp:  (!) 9  Temp:    SpO2: 100% 100%    Last Pain:  Vitals:   11/18/22 1300  TempSrc:   PainSc: Asleep                 Chella Chapdelaine

## 2022-11-18 NOTE — Op Note (Signed)
Chase Lowe, Chase Lowe MEDICAL RECORD NO: [id number] ACCOUNT NO: [id number] DATE OF BIRTH: [birth year] FACILITY: [facility] LOCATION: [facility] PHYSICIAN: Madlyn Frankel. 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  PROCEDURE:  Revision right total knee arthroplasty with a revision of the femoral component to a size 7 revision. Attune revision component for the right knee with a 14 x 50 mm cemented stem with a medium press-fit cone and 8 mm posterior medial and  lateral augments.  We used an 8 mm posterior stabilized insert for the 7 femur.  SURGEON:  Madlyn Frankel. Charlann Boxer, MD  ASSISTANT:  Rosalene Billings, PA-C.  Note, Ms. Domenic Schwab was present for the entirety of the case from preoperative positioning, perioperative management of the operative extremity, general facilitation of the case and primary wound closure.  ANESTHESIA:  Attempted spinal block, general and regional block.  TOURNIQUET:  Up for 44 minutes at 225 mmHg.  DRAINS:  None.  BLOOD LOSS:  Less than 200 mL.  INDICATIONS FOR THE PROCEDURE:  The patient is a pleasant 62 year old male who was seen and evaluated for right knee pain following total knee arthroplasty less than 2 years ago.  He had noted activity related pain and swelling.  Examination in the  office raised concerns for flexion instability as a potential source of his recurrent effusion.  Workup for infection was negative with normal sedimentation rate, and C-reactive proteins.  We had a lengthy discussion regarding the diagnosis of flexion  instability with regards to the challenges of making this is an accurate that precisely what I diagnosed a problem as well as the potential issues with regards to solving this.  We discussed the polyethylene exchange would complicate trying to get full   knee extension back whereas the preferred treatment would be to plan for femoral revision in order to try to close down the flexion space.  After he gave it some thought based on the recurrence of his swelling with activity he wished to proceed with  revision knee surgery.  The risks of infection, persistent challenges related to flexion instability as well as the postoperative course and effort required to maximize his functional return.  Consent was obtained for the benefit of pain relief.  DESCRIPTION OF PROCEDURE:  The patient was brought to the operative theater.  Once adequate anesthesia, preoperative antibiotics, Ancef administered, he was positioned supine with a right thigh tourniquet placed.  The right lower extremity was prepped  and draped in sterile fashion.  Timeout was performed identifying the patient, planned procedure, and extremity.  The leg was exsanguinated and tourniquet elevated to 225 mmHg.  His old incision was excised and extended slightly proximal and distal for  exposure for revision purposes.  Soft tissue planes were created.  Median arthrotomy was made encountering a large normal synovial effusion.  At this point, I performed an extensive synovectomy and scar debridement throughout the medial, lateral as well  as suprapatellar region of the knee.  Once this was performed and the knee was exposed the old polyethylene was removed.  We then used a thin ACL saw and undermined the medial and lateral aspect of the femoral component from the anterior flange to the  distal aspect.  We then used osteotomes medially and laterally to remove the femoral component.  The femoral component was removed with minimal bone loss.  At this point, based on preoperative planning had discussed using a cemented stem with a press-fit  cone to provide stability of the component.  At this point, I went ahead and reamed up to 16 mm for 14 mm cemented stem.  We then prepared for a medium size cone.  Based  on the size of the component that we are going to use.  I had removed a size 6  femoral component.  I decided to upsize to a 7.  We played with the adjustments of the options of the stem either with a neutral adapter or +2 adapter.  Ultimately landed on a neutral adapter with a 7 component with 8 mm augments with only removal of  about a 1 mm or so of the posterior condylar bone.  We did a trial reduction with this in place.  The flexion gap seemed to be significantly closed down and attention more appropriately well as the knee came to full extension.  Given all these findings,  we removed the trial components.  We injected the posterior capsular junction of the knee with 0.25% Marcaine with epinephrine, 1 mL of Toradol carefully making certain there was no intravascular penetration.  We then irrigated the knee with normal  saline solution.  While this was going the final components were opened and configured on the back table under direct supervision.  Once this was ready, cement was mixed.  We placed a cement restrictor into the distal femur.  I then impacted the cone  into position.  I then cemented the final femoral component into place and then brought the knee to extension with an 8 mm insert.  Once the cement fully cured, the excessive cement was removed throughout the knee.  The tourniquet had been let down after  44 minutes.  Once the cement fully cured, we removed excessive cement.  Based on the trial reductions we selected an 8 mm insert to match the 7 femur.  This was placed in the knee and the knee reduced.  We reirrigated the knee with normal saline  solution.  The extensor mechanism was then reapproximated using #1 Vicryl and #1 Stratafix suture.  The remainder of the wound was closed with 2-0 Vicryl and a running Monocryl stitch.  The wound was then cleaned, dried and dressed sterilely using  surgical glue and Aquacel dressing.  The patient was brought to the recovery room in stable  condition, tolerating the procedure well.  We will anticipate an overnight hospital stay with routine postoperative physical therapy to regain his extension and  flexion.  Findings reviewed with family.   PUS D: 11/18/2022 11:51:56 am T: 11/18/2022 12:43:00 pm  JOB: [id number]/ [id number]

## 2022-11-18 NOTE — Evaluation (Signed)
Physical Therapy Evaluation Patient Details Name: Chase Lowe MRN: [id number] DOB: 1960-11-24 Today's Date: 11/18/2022  History of Present Illness  Pt is a 62 year old male s/p revision right total knee arthroplasty with a revision of the femoral component on 11/18/22  Clinical Impression  Pt is s/p TKA revision resulting in the deficits listed below (see PT Problem List).  Pt will benefit from acute skilled PT to increase their independence and safety with mobility to allow discharge.  Pt reports feeling sleepy and groggy from pain meds however able to ambulate 60 ft in hallway with RW POD #0.  Pt encouraged to remain OOB in recliner end of session.  Pt anticipates return home with spouse assist.        Recommendations for follow up therapy are one component of a multi-disciplinary discharge planning process, led by the attending physician.  Recommendations may be updated based on patient status, additional functional criteria and insurance authorization.  Follow Up Recommendations       Assistance Recommended at Discharge PRN  Patient can return home with the following       Equipment Recommendations None recommended by PT  Recommendations for Other Services       Functional Status Assessment Patient has had a recent decline in their functional status and demonstrates the ability to make significant improvements in function in a reasonable and predictable amount of time.     Precautions / Restrictions Precautions Precautions: Fall;Knee Restrictions Weight Bearing Restrictions: No Other Position/Activity Restrictions: WBAT      Mobility  Bed Mobility Overal bed mobility: Needs Assistance Bed Mobility: Supine to Sit     Supine to sit: Min guard, HOB elevated     General bed mobility comments: cues for sequence, increased time and effort    Transfers Overall transfer level: Needs assistance Equipment used: Rolling walker (2 wheels) Transfers: Sit to/from  Stand Sit to Stand: Min guard           General transfer comment: verbal cues for UE and LE positioning    Ambulation/Gait Ambulation/Gait assistance: Min guard Gait Distance (Feet): 60 Feet Assistive device: Rolling walker (2 wheels) Gait Pattern/deviations: Step-to pattern, Decreased stance time - right, Antalgic Gait velocity: decr     General Gait Details: verbal cues for sequence, RW positioning, distance to tolerance, no dizziness  Stairs            Wheelchair Mobility    Modified Rankin (Stroke Patients Only)       Balance                                             Pertinent Vitals/Pain Pain Assessment Pain Assessment: No/denies pain    Home Living Family/patient expects to be discharged to:: Private residence Living Arrangements: Spouse/significant other;Children Available Help at Discharge: Family Type of Home: House Home Access: Stairs to enter Entrance Stairs-Rails: Doctor, general practice of Steps: 2-3   Home Layout: One level Home Equipment: Agricultural consultant (2 wheels)      Prior Function Prior Level of Function : Independent/Modified Independent                     Hand Dominance        Extremity/Trunk Assessment        Lower Extremity Assessment Lower Extremity Assessment: RLE deficits/detail RLE Deficits / Details: able to perform  SLR and ankle pumps       Communication   Communication: No difficulties  Cognition Arousal/Alertness: Awake/alert Behavior During Therapy: WFL for tasks assessed/performed Overall Cognitive Status: Within Functional Limits for tasks assessed                                 General Comments: reports feeling sleepy and groggy from meds        General Comments      Exercises Total Joint Exercises Ankle Circles/Pumps: AROM, 10 reps, Both   Assessment/Plan    PT Assessment Patient needs continued PT services  PT Problem List Decreased  mobility;Decreased activity tolerance;Decreased strength;Pain;Decreased range of motion       PT Treatment Interventions Stair training;Gait training;DME instruction;Therapeutic exercise;Functional mobility training;Therapeutic activities;Patient/family education    PT Goals (Current goals can be found in the Care Plan section)  Acute Rehab PT Goals PT Goal Formulation: With patient Time For Goal Achievement: 11/25/22 Potential to Achieve Goals: Good    Frequency 7X/week     Co-evaluation               AM-PAC PT "6 Clicks" Mobility  Outcome Measure Help needed turning from your back to your side while in a flat bed without using bedrails?: A Little Help needed moving from lying on your back to sitting on the side of a flat bed without using bedrails?: A Little Help needed moving to and from a bed to a chair (including a wheelchair)?: A Little Help needed standing up from a chair using your arms (e.g., wheelchair or bedside chair)?: A Little Help needed to walk in hospital room?: A Little Help needed climbing 3-5 steps with a railing? : A Little 6 Click Score: 18    End of Session Equipment Utilized During Treatment: Gait belt Activity Tolerance: Patient tolerated treatment well Patient left: in chair;with chair alarm set;with call bell/phone within reach Nurse Communication: Mobility status PT Visit Diagnosis: Difficulty in walking, not elsewhere classified (R26.2)    Time: 1543-1600 PT Time Calculation (min) (ACUTE ONLY): 17 min   Charges:   PT Evaluation $PT Eval Low Complexity: 1 Low     Kati PT, DPT Physical Therapist Acute Rehabilitation Services Office: [phone]   Janan Halter Payson 11/18/2022, 4:17 PM

## 2022-11-18 NOTE — Anesthesia Procedure Notes (Signed)
Procedure Name: LMA Insertion Date/Time: 11/18/2022 10:00 AM  Performed by: Deri Fuelling, CRNAPre-anesthesia Checklist: Patient identified, Emergency Drugs available, Suction available and Patient being monitored Patient Re-evaluated:Patient Re-evaluated prior to induction Oxygen Delivery Method: Circle system utilized Preoxygenation: Pre-oxygenation with 100% oxygen Induction Type: IV induction Ventilation: Mask ventilation without difficulty LMA Size: 4.0 Tube type: Oral Number of attempts: 1 Airway Equipment and Method: Stylet and Oral airway Placement Confirmation: ETT inserted through vocal cords under direct vision, positive ETCO2 and breath sounds checked- equal and bilateral Tube secured with: Tape Dental Injury: Teeth and Oropharynx as per pre-operative assessment

## 2022-11-18 NOTE — Discharge Instructions (Signed)

## 2022-11-18 NOTE — Brief Op Note (Signed)
11/18/2022  11:39 AM  PATIENT:  Chase Lowe  62 y.o. 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  PROCEDURE:  Procedure(s): TOTAL KNEE REVISION FEMORAL COMPONENT ONLY (Right) and polyethylene  SURGEON:  Surgeon(s) and Role:    Durene Romans, MD - Primary  PHYSICIAN ASSISTANT: Rosalene Billings, PA-C  ANESTHESIA:   regional, spinal, and general  EBL:  < 200 cc    BLOOD ADMINISTERED:none  DRAINS: none   LOCAL MEDICATIONS USED:  MARCAINE     SPECIMEN:  No Specimen  DISPOSITION OF SPECIMEN:  N/A  COUNTS:  YES  TOURNIQUET:   Total Tourniquet Time Documented: Thigh (Right) - 44 minutes Total: Thigh (Right) - 44 minutes   DICTATION: .Other Dictation: Dictation Number [id number]  PLAN OF CARE: Admit for overnight observation  PATIENT DISPOSITION:  PACU - hemodynamically stable.   Delay start of Pharmacological VTE agent (>24hrs) due to surgical blood loss or risk of bleeding: no

## 2022-11-18 NOTE — Transfer of Care (Signed)
Immediate Anesthesia Transfer of Care Note  Patient: Chase Lowe  Procedure(s) Performed: TOTAL KNEE REVISION FEMORAL COMPONENT ONLY (Right: Knee)  Patient Location: PACU  Anesthesia Type:GA combined with regional for post-op pain  Level of Consciousness: sedated and patient cooperative  Airway & Oxygen Therapy: Patient Spontanous Breathing and Patient connected to face mask oxygen  Post-op Assessment: Report given to RN and Post -op Vital signs reviewed and stable  Post vital signs: Reviewed and stable  Last Vitals:  Vitals Value Taken Time  BP 100/65 11/18/22 1154  Temp    Pulse 62 11/18/22 1159  Resp 9 11/18/22 1159  SpO2 100 % 11/18/22 1159  Vitals shown include unvalidated device data.  Last Pain:  Vitals:   11/18/22 0903  TempSrc:   PainSc: Asleep         Complications: No notable events documented.

## 2022-11-18 NOTE — Anesthesia Procedure Notes (Signed)
Anesthesia Regional Block: Adductor canal block   Pre-Anesthetic Checklist: , timeout performed,  Correct Patient, Correct Site, Correct Laterality,  Correct Procedure, Correct Position, site marked,  Risks and benefits discussed,  Surgical consent,  Pre-op evaluation,  At surgeon's request and post-op pain management  Laterality: Right  Prep: chloraprep       Needles:  Injection technique: Single-shot  Needle Type: Echogenic Stimulator Needle     Needle Length: 5cm  Needle Gauge: 22     Additional Needles:   Procedures:,,,, ultrasound used (permanent image in chart),,     Nerve Stimulator or Paresthesia:  Response: quadraceps contraction  Additional Responses:   Narrative:  Start time: 11/18/2022 8:30 AM End time: 11/18/2022 8:40 AM Injection made incrementally with aspirations every 5 mL.  Performed by: Personally  Anesthesiologist: Bethena Midget, MD  Additional Notes: Functioning IV was confirmed and monitors were applied.  A 50mm 22ga Arrow echogenic stimulator needle was used. Sterile prep and drape,hand hygiene and sterile gloves were used. Ultrasound guidance: relevant anatomy identified, needle position confirmed, local anesthetic spread visualized around nerve(s)., vascular puncture avoided.  Image printed for medical record. Negative aspiration and negative test dose prior to incremental administration of local anesthetic. The patient tolerated the procedure well.

## 2022-11-18 NOTE — Anesthesia Preprocedure Evaluation (Addendum)
Anesthesia Evaluation  Patient identified by MRN, date of birth, ID band Patient awake    Reviewed: Allergy & Precautions, NPO status , Patient's Chart, lab work & pertinent test results  History of Anesthesia Complications (+) PONV and history of anesthetic complications  Airway Mallampati: III  TM Distance: >3 FB Neck ROM: Full    Dental  (+) Teeth Intact   Pulmonary former smoker   Pulmonary exam normal        Cardiovascular hypertension, Normal cardiovascular exam     Neuro/Psych negative neurological ROS  negative psych ROS   GI/Hepatic Neg liver ROS, PUD,GERD  Controlled,,  Endo/Other  negative endocrine ROS    Renal/GU negative Renal ROS     Musculoskeletal  (+) Arthritis , Osteoarthritis,    Abdominal   Peds  Hematology negative hematology ROS (+)   Anesthesia Other Findings   Reproductive/Obstetrics                             Anesthesia Physical Anesthesia Plan  ASA: 2  Anesthesia Plan: MAC, Regional and Spinal   Post-op Pain Management: Minimal or no pain anticipated and Regional block*   Induction: Intravenous  PONV Risk Score and Plan: 3 and Midazolam, Dexamethasone, Ondansetron, Treatment may vary due to age or medical condition and Propofol infusion  Airway Management Planned: Natural Airway, Simple Face Mask and Mask  Additional Equipment: None  Intra-op Plan:   Post-operative Plan:   Informed Consent: I have reviewed the patients History and Physical, chart, labs and discussed the procedure including the risks, benefits and alternatives for the proposed anesthesia with the patient or authorized representative who has indicated his/her understanding and acceptance.     Dental advisory given  Plan Discussed with: CRNA and Anesthesiologist  Anesthesia Plan Comments:         Anesthesia Quick Evaluation

## 2022-11-19 ENCOUNTER — Other Ambulatory Visit (HOSPITAL_COMMUNITY): Payer: Self-pay

## 2022-11-19 ENCOUNTER — Encounter (HOSPITAL_COMMUNITY): Payer: Self-pay | Admitting: Orthopedic Surgery

## 2022-11-19 DIAGNOSIS — T84092A Other mechanical complication of internal right knee prosthesis, initial encounter: Secondary | ICD-10-CM | POA: Diagnosis not present

## 2022-11-19 DIAGNOSIS — Z79899 Other long term (current) drug therapy: Secondary | ICD-10-CM | POA: Diagnosis not present

## 2022-11-19 DIAGNOSIS — T84022A Instability of internal right knee prosthesis, initial encounter: Secondary | ICD-10-CM | POA: Diagnosis not present

## 2022-11-19 DIAGNOSIS — Z87891 Personal history of nicotine dependence: Secondary | ICD-10-CM | POA: Diagnosis not present

## 2022-11-19 DIAGNOSIS — I10 Essential (primary) hypertension: Secondary | ICD-10-CM | POA: Diagnosis not present

## 2022-11-19 DIAGNOSIS — Z96651 Presence of right artificial knee joint: Secondary | ICD-10-CM | POA: Diagnosis not present

## 2022-11-19 DIAGNOSIS — Z7982 Long term (current) use of aspirin: Secondary | ICD-10-CM | POA: Diagnosis not present

## 2022-11-19 LAB — CBC
HCT: 31.7 % — ABNORMAL LOW (ref 39.0–52.0)
Hemoglobin: 10.9 g/dL — ABNORMAL LOW (ref 13.0–17.0)
MCH: 32.4 pg (ref 26.0–34.0)
MCHC: 34.4 g/dL (ref 30.0–36.0)
MCV: 94.3 fL (ref 80.0–100.0)
Platelets: 123 10*3/uL — ABNORMAL LOW (ref 150–400)
RBC: 3.36 MIL/uL — ABNORMAL LOW (ref 4.22–5.81)
RDW: 12.1 % (ref 11.5–15.5)
WBC: 6.9 10*3/uL (ref 4.0–10.5)
nRBC: 0 % (ref 0.0–0.2)

## 2022-11-19 LAB — BASIC METABOLIC PANEL
Anion gap: 6 (ref 5–15)
BUN: 16 mg/dL (ref 8–23)
CO2: 26 mmol/L (ref 22–32)
Calcium: 8.6 mg/dL — ABNORMAL LOW (ref 8.9–10.3)
Chloride: 107 mmol/L (ref 98–111)
Creatinine, Ser: 1.03 mg/dL (ref 0.61–1.24)
GFR, Estimated: >60 mL/min (ref >60)
Glucose, Bld: 117 mg/dL — ABNORMAL HIGH (ref 70–99)
Potassium: 5.1 mmol/L (ref 3.5–5.1)
Sodium: 139 mmol/L (ref 135–145)

## 2022-11-19 MED ORDER — ASPIRIN 81 MG PO CHEW: 81.0000 mg | CHEWABLE_TABLET | Freq: Two times a day (BID) | ORAL | 0 refills | Status: AC

## 2022-11-19 MED ORDER — ONDANSETRON HCL 4 MG PO TABS: 4.0000 mg | ORAL_TABLET | Freq: Four times a day (QID) | ORAL | 0 refills | Status: DC | PRN

## 2022-11-19 MED ORDER — POLYETHYLENE GLYCOL 3350 17 G PO PACK: 17.0000 g | PACK | Freq: Two times a day (BID) | ORAL | 0 refills | Status: DC

## 2022-11-19 MED ORDER — METHOCARBAMOL 500 MG PO TABS: 500.0000 mg | ORAL_TABLET | Freq: Four times a day (QID) | ORAL | 2 refills | Status: DC | PRN

## 2022-11-19 MED ORDER — HYDROMORPHONE HCL 2 MG PO TABS
2.0000 mg | ORAL_TABLET | ORAL | 0 refills | Status: DC | PRN
  Filled 2022-11-19: qty 42, 7d supply, fill #0

## 2022-11-19 NOTE — Progress Notes (Signed)
Patient ID: Chase Lowe, male   DOB: [birth year], 62 y.o.   MRN: [id number] Subjective: 1 Day Post-Op Procedure(s) (LRB): TOTAL KNEE REVISION FEMORAL COMPONENT ONLY (Right)    Patient reports pain as mild to moderate.  Other than reporting that he feels tired this morning he has no major complaints.  He was able to get up with physical therapy and tolerated some early activity.  Objective:   VITALS:   Vitals:   11/19/22 0139 11/19/22 0537  BP: 106/72 104/76  Pulse: 72 82  Resp: 16 17  Temp: 97.7 F (36.5 C) (!) 97.5 F (36.4 C)  SpO2: 97% 98%    Neurovascular intact Incision: dressing C/D/I His Ace wrap was removed.  His surgical dressing is clean dry and intact  LABS Recent Labs    11/19/22 0337  HGB 10.9*  HCT 31.7*  WBC 6.9  PLT 123*    Recent Labs    11/19/22 0337  NA 139  K 5.1  BUN 16  CREATININE 1.03  GLUCOSE 117*    No results for input(s): "LABPT", "INR" in the last 72 hours.   Assessment/Plan: 1 Day Post-Op Procedure(s) (LRB): TOTAL KNEE REVISION FEMORAL COMPONENT ONLY (Right)   Advance diet Up with therapy He will work with physical therapy with the plans to be discharged later today.  He would like to get home as soon as possible likely discharging around lunchtime. He does have outpatient physical therapy already set up. We will see him back in 2 weeks to review his postoperative course and wound. I will see him back in 6 weeks and review radiographs with him to discuss more precisely the procedure that was performed.

## 2022-11-19 NOTE — Progress Notes (Signed)
Physical Therapy Treatment Patient Details Name: Chase Lowe MRN: [id number] DOB: [birth year] Today's Date: 11/19/2022   History of Present Illness Pt is a 62 year old male s/p revision right total knee arthroplasty with a revision of the femoral component on 11/18/22    PT Comments    Pt ambulated in hallway, practiced safe stair technique, and performed LE exercises.  Pt provided with HEP handout and had no further questions.  Pt feels ready for d/c home today.    Recommendations for follow up therapy are one component of a multi-disciplinary discharge planning process, led by the attending physician.  Recommendations may be updated based on patient status, additional functional criteria and insurance authorization.  Follow Up Recommendations       Assistance Recommended at Discharge PRN  Patient can return home with the following     Equipment Recommendations  None recommended by PT    Recommendations for Other Services       Precautions / Restrictions Precautions Precautions: Fall;Knee Restrictions RLE Weight Bearing: Weight bearing as tolerated     Mobility  Bed Mobility Overal bed mobility: Modified Independent                  Transfers Overall transfer level: Needs assistance Equipment used: Rolling walker (2 wheels) Transfers: Sit to/from Stand Sit to Stand: Supervision, Min guard           General transfer comment: verbal cues for UE and LE positioning    Ambulation/Gait Ambulation/Gait assistance: Min guard Gait Distance (Feet): 160 Feet Assistive device: Rolling walker (2 wheels) Gait Pattern/deviations: Step-to pattern, Decreased stance time - right, Antalgic, Step-through pattern Gait velocity: decr     General Gait Details: verbal cues for sequence, RW positioning, distance to tolerance, no dizziness   Stairs Stairs: Yes Stairs assistance: Min guard Stair Management: Forwards, Sideways, Step to pattern, One rail Right Number  of Stairs: 2 General stair comments: both hands on right rail, cues for safety and sequencing; pt report understanding and able to repeat back sequence   Wheelchair Mobility    Modified Rankin (Stroke Patients Only)       Balance                                            Cognition Arousal/Alertness: Awake/alert Behavior During Therapy: WFL for tasks assessed/performed Overall Cognitive Status: Within Functional Limits for tasks assessed                                          Exercises Total Joint Exercises Ankle Circles/Pumps: AROM, Both, 10 reps Quad Sets: AROM, Both, 10 reps Heel Slides: AAROM, Right, 10 reps Hip ABduction/ADduction: AROM, Right, 10 reps Straight Leg Raises: AROM, Right, 10 reps Knee Flexion: AROM, Seated, Right, 10 reps Approx 90* right knee flexion AAROM sitting edge of chair    General Comments        Pertinent Vitals/Pain Pain Assessment Pain Assessment: 0-10 Pain Score: 5  Pain Location: right knee Pain Descriptors / Indicators: Sore Pain Intervention(s): Repositioned, Monitored during session    Home Living                          Prior Function  PT Goals (current goals can now be found in the care plan section) Progress towards PT goals: Progressing toward goals    Frequency    7X/week      PT Plan Current plan remains appropriate    Co-evaluation              AM-PAC PT "6 Clicks" Mobility   Outcome Measure  Help needed turning from your back to your side while in a flat bed without using bedrails?: None Help needed moving from lying on your back to sitting on the side of a flat bed without using bedrails?: A Little Help needed moving to and from a bed to a chair (including a wheelchair)?: A Little Help needed standing up from a chair using your arms (e.g., wheelchair or bedside chair)?: A Little Help needed to walk in hospital room?: A Little Help  needed climbing 3-5 steps with a railing? : A Little 6 Click Score: 19    End of Session Equipment Utilized During Treatment: Gait belt Activity Tolerance: Patient tolerated treatment well Patient left: in chair;with call bell/phone within reach;with family/visitor present;with nursing/sitter in room Nurse Communication: Mobility status PT Visit Diagnosis: Difficulty in walking, not elsewhere classified (R26.2)     Time: 1610-9604 PT Time Calculation (min) (ACUTE ONLY): 17 min  Charges:  $Gait Training: 8-22 mins                     Paulino Door, DPT Physical Therapist Acute Rehabilitation Services Office: [phone]    Kati L Payson 11/19/2022, 10:18 AM

## 2022-11-19 NOTE — TOC Transition Note (Signed)
Transition of Care [facility]) - CM/SW Discharge Note   Patient Details  Name: Chase Lowe MRN: [id number] Date of Birth: Oct 24, 1960  Transition of Care [facility]) CM/SW Contact:  Amada Jupiter, LCSW Phone Number: 11/19/2022, 10:53 AM   Clinical Narrative:     Met with pt and confirming he has needed DME at home.  OPPT already arranged with [facility].  No TOC needs.  Final next level of care: OP Rehab Barriers to Discharge: No Barriers Identified   Patient Goals and CMS Choice      Discharge Placement                         Discharge Plan and Services Additional resources added to the After Visit Summary for                  DME Arranged: N/A DME Agency: NA                  Social Determinants of Health (SDOH) Interventions SDOH Screenings   Food Insecurity: No Food Insecurity (11/18/2022)  Housing: Low Risk  (11/18/2022)  Transportation Needs: No Transportation Needs (11/18/2022)  Utilities: Not At Risk (11/18/2022)  Alcohol Screen: Low Risk  (07/11/2022)  Depression (PHQ2-9): Low Risk  (11/11/2022)  Financial Resource Strain: Low Risk  (07/11/2022)  Physical Activity: Sufficiently Active (07/11/2022)  Social Connections: Moderately Isolated (07/11/2022)  Stress: No Stress Concern Present (07/11/2022)  Tobacco Use: Medium Risk (11/18/2022)     Readmission Risk Interventions    11/19/2022   10:52 AM  Readmission Risk Prevention Plan  Post Dischage Appt Complete  Medication Screening Complete  Transportation Screening Complete

## 2022-11-22 ENCOUNTER — Telehealth: Payer: Self-pay

## 2022-11-22 DIAGNOSIS — M25561 Pain in right knee: Secondary | ICD-10-CM | POA: Diagnosis not present

## 2022-11-22 DIAGNOSIS — M25661 Stiffness of right knee, not elsewhere classified: Secondary | ICD-10-CM | POA: Diagnosis not present

## 2022-11-22 NOTE — Transitions of Care (Post Inpatient/ED Visit) (Signed)
   11/22/2022  Name: Chase Lowe MRN: [id number] DOB: December 26, 1960  Today's TOC FU Call Status: Today's TOC FU Call Status:: Unsuccessul Call (1st Attempt) Unsuccessful Call (1st Attempt) Date: 11/22/22  Attempted to reach the patient regarding the most recent Inpatient/ED visit.  Follow Up Plan: Additional outreach attempts will be made to reach the patient to complete the Transitions of Care (Post Inpatient/ED visit) call.   Signature Agnes Lawrence, CMA (AAMA)  [facility] [phone]

## 2022-11-23 ENCOUNTER — Encounter: Payer: Self-pay | Admitting: Family Medicine

## 2022-11-23 ENCOUNTER — Ambulatory Visit (INDEPENDENT_AMBULATORY_CARE_PROVIDER_SITE_OTHER): Payer: Commercial Managed Care - PPO | Admitting: Family Medicine

## 2022-11-23 VITALS — BP 134/88 | HR 88 | Temp 98.0°F | Wt 204.4 lb

## 2022-11-23 DIAGNOSIS — J329 Chronic sinusitis, unspecified: Secondary | ICD-10-CM

## 2022-11-23 DIAGNOSIS — B9689 Other specified bacterial agents as the cause of diseases classified elsewhere: Secondary | ICD-10-CM

## 2022-11-23 DIAGNOSIS — R0981 Nasal congestion: Secondary | ICD-10-CM | POA: Diagnosis not present

## 2022-11-23 LAB — POC COVID19 BINAXNOW: SARS Coronavirus 2 Ag: NEGATIVE

## 2022-11-23 MED ORDER — AMOXICILLIN-POT CLAVULANATE 875-125 MG PO TABS: 1.0000 | ORAL_TABLET | Freq: Two times a day (BID) | ORAL | 0 refills | Status: DC

## 2022-11-23 MED ORDER — FLUTICASONE PROPIONATE 50 MCG/ACT NA SUSP
2.0000 | Freq: Every day | NASAL | 6 refills | Status: DC
Start: 2022-11-23 — End: 2023-01-12

## 2022-11-23 NOTE — Patient Instructions (Addendum)
Return if symptoms worsen or fail to improve.        Great to see you today.  I have refilled the medication(s) we provide.   If labs were collected, we will inform you of lab results once received either by echart message or telephone call.   - echart message- for normal results that have been seen by the patient already.   - telephone call: abnormal results or if patient has not viewed results in their echart.  

## 2022-11-23 NOTE — Progress Notes (Signed)
ABDURREHMAN Lowe , January 22, 1961, 62 y.o., male MRN: [id number] Patient Care Team    Relationship Specialty Notifications Start End  McGowen, Maryjean Morn, MD PCP - General Family Medicine  12/25/21   Carman Ching, MD (Inactive) Consulting Physician Gastroenterology  04/14/22     Chief Complaint  Patient presents with   Nasal Congestion    Pressure across front of forehead; onset saturday     Subjective: Chase Lowe is a 62 y.o. Pt presents for an OV with complaints of nasal congestion and sinus pressure of 4 days duration.  Associated symptoms include (see ros). Recent right  knee revision 11/18/2022 (spinal), otherwise no know exposures.  Pt has tried zyrtec to ease their symptoms.      11/11/2022    3:31 PM 07/12/2022    8:04 AM 12/25/2021    8:33 AM  Depression screen PHQ 2/9  Decreased Interest 0 0 0  Down, Depressed, Hopeless 0 0 0  PHQ - 2 Score 0 0 0    Allergies  Allergen Reactions   Aspirin Other (See Comments)    High doses contraindicated due to ulcer   Codeine Nausea And Vomiting   Oxycodone-Acetaminophen Nausea And Vomiting   Social History   Social History Narrative   Married,   Educ: HS   Occup: Airline pilot   Past Medical History:  Diagnosis Date   Complication of anesthesia    during a colonoscopy pt. woke up during the procedure and also during a hand surgery    Duodenal ulcer 1983   GERD (gastroesophageal reflux disease)    Hay fever    History of adenomatous polyp of colon    2016 (Dr. Randa Evens, [facility])   History of blood product transfusion    Hypertension    PONV (postoperative nausea and vomiting)    Primary osteoarthritis of knees, bilateral    Shingles    Right lateral hip extending down over right thigh and knee, initial episode 04/2021, recurrence x1 after.  No postherpetic neuralgia.   Past Surgical History:  Procedure Laterality Date   ANTERIOR CERVICAL DECOMP/DISCECTOMY FUSION N/A 06/30/2017   Procedure: ANTERIOR CERVICAL  DECOMPRESSION/DISCECTOMY FUSION C5-6;  Surgeon: Venita Lick, MD;  Location: [facility] OR;  Service: Orthopedics;  Laterality: N/A;  3 hrs   COLONOSCOPY  7829,[id number]   09/02/14 polyps x 3 ([facility])   GANGLION CYST EXCISION Left 2006   HEEL SPUR SURGERY Right 2013   HEEL SPUR SURGERY Right    2nd surgery   INGUINAL HERNIA REPAIR Left 04/26/2014   Procedure: LEFT INGUINAL HERNIA REPAIR WITH MESH;  Surgeon: Abigail Miyamoto, MD;  Location: [facility] OR;  Service: General;  Laterality: Left;   INGUINAL HERNIA REPAIR Right 02/20/2016   Procedure: RIGHT INGUINAL HERNIA REPAIR WITH MESH;  Surgeon: Abigail Miyamoto, MD;  Location: [facility] OR;  Service: General;  Laterality: Right;   INSERTION OF MESH Left 04/26/2014   Procedure: INSERTION OF MESH;  Surgeon: Abigail Miyamoto, MD;  Location: [facility] OR;  Service: General;  Laterality: Left;   INSERTION OF MESH Right 02/20/2016   Procedure: INSERTION OF MESH;  Surgeon: Abigail Miyamoto, MD;  Location: [facility] OR;  Service: General;  Laterality: Right;   KNEE ARTHROSCOPY Left 02/2015   left   KNEE ARTHROSCOPY Left 12/2015   KNEE ARTHROSCOPY Right    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 If you have any questions, ask your nurse or doctor.          STOP taking these medications    HYDROmorphone 2 MG tablet Commonly known as: DILAUDID Stopped by: Felix Pacini, DO   methocarbamol 500 MG tablet Commonly known as:  ROBAXIN Stopped by: Felix Pacini, DO   ondansetron 4 MG tablet Commonly known as: ZOFRAN Stopped by: Felix Pacini, DO   polyethylene glycol 17 g packet Commonly known as: MIRALAX / GLYCOLAX Stopped by: Felix Pacini, DO       TAKE these medications    amoxicillin-clavulanate 875-125 MG tablet Commonly known as: AUGMENTIN Take 1 tablet by mouth 2 (two) times daily. Started by: Felix Pacini, DO   aspirin 81 MG chewable tablet Chew 1 tablet (81 mg total) by mouth 2 (two) times daily for 28 days.   cetirizine 10 MG tablet Commonly known as: ZYRTEC Take 10 mg by mouth daily as needed for allergies.   fluticasone 50 MCG/ACT nasal spray Commonly known as: FLONASE Place 2 sprays into both nostrils daily. Started by: Felix Pacini, DO   lisinopril 5 MG tablet Commonly known as: ZESTRIL Take 1 tablet by mouth daily.   MULTIVITAMIN PO Take 1 tablet by mouth daily.   valACYclovir 1000 MG tablet Commonly known as: Valtrex Take 1 tablet (1,000 mg total) by mouth 2 (two) times daily for 15 days        All past medical history, surgical history, allergies, family history, immunizations andmedications were updated in the EMR today and reviewed under the history and medication portions of their EMR.     Review of Systems  Constitutional:  Positive for malaise/fatigue. Negative for chills and fever.  HENT:  Positive for congestion, ear pain and sinus pain.   Respiratory:  Positive for cough and sputum production.   Gastrointestinal:  Negative for constipation, diarrhea, nausea and vomiting.  Musculoskeletal:  Negative for myalgias.  Skin:  Negative for rash.  Neurological:  Negative for dizziness and headaches.   Negative, with the exception of above mentioned in HPI   Objective:  BP 134/88   Pulse 88   Temp 98 F (36.7 C)   Wt 204 lb 6.4 oz (92.7 kg)   SpO2 98%   BMI 28.51 kg/m  Body mass index is 28.51 kg/m. Physical Exam Vitals and nursing note reviewed.   Constitutional:      General: He is not in acute distress.    Appearance: Normal appearance. He is not ill-appearing, toxic-appearing or diaphoretic.  HENT:     Head: Normocephalic and atraumatic.     Right Ear: Ear canal and external ear normal. A middle ear effusion is present. Tympanic membrane is not erythematous.     Left Ear: Ear canal and external ear normal. A middle ear effusion is present. Tympanic membrane is not erythematous.     Nose: Congestion and rhinorrhea present.     Mouth/Throat:  Mouth: Mucous membranes are moist.     Pharynx: No oropharyngeal exudate or posterior oropharyngeal erythema.  Eyes:     General: No scleral icterus.       Right eye: No discharge.        Left eye: No discharge.     Extraocular Movements: Extraocular movements intact.     Pupils: Pupils are equal, round, and reactive to light.  Cardiovascular:     Rate and Rhythm: Normal rate and regular rhythm.     Heart sounds: No murmur heard. Pulmonary:     Effort: No respiratory distress.     Breath sounds: Normal breath sounds. No wheezing, rhonchi or rales.  Musculoskeletal:     Cervical back: Neck supple.     Right lower leg: No edema.     Left lower leg: No edema.  Lymphadenopathy:     Cervical: No cervical adenopathy.  Skin:    General: Skin is warm and dry.     Coloration: Skin is not jaundiced or pale.     Findings: No rash.  Neurological:     Mental Status: He is alert and oriented to person, place, and time. Mental status is at baseline.  Psychiatric:        Mood and Affect: Mood normal.        Behavior: Behavior normal.        Thought Content: Thought content normal.        Judgment: Judgment normal.     No results found. No results found. Results for orders placed or performed in visit on 11/23/22 (from the past 24 hour(s))  POC COVID-19 BinaxNow     Status: None   Collection Time: 11/23/22 11:14 AM  Result Value Ref Range   SARS Coronavirus 2 Ag Negative Negative     Assessment/Plan: Chase Lowe is a 62 y.o. male present for OV for  Nasal congestion/Bacterial sinusitis - POC COVID-19 BinaxNow> negative Rest, hydrate.  +/- flonase, mucinex (DM if cough), nettie pot or nasal saline.  Augmentin  prescribed, take until completed.  With sx and recent joint revision elected to treat F/U 2 weeks of not improved.    Reviewed expectations re: course of current medical issues. Discussed self-management of symptoms. Outlined signs and symptoms indicating need for more acute intervention. Patient verbalized understanding and all questions were answered. Patient received an After-Visit Summary.    Orders Placed This Encounter  Procedures   POC COVID-19 BinaxNow   Meds ordered this encounter  Medications   amoxicillin-clavulanate (AUGMENTIN) 875-125 MG tablet    Sig: Take 1 tablet by mouth 2 (two) times daily.    Dispense:  20 tablet    Refill:  0   fluticasone (FLONASE) 50 MCG/ACT nasal spray    Sig: Place 2 sprays into both nostrils daily.    Dispense:  16 g    Refill:  6   Referral Orders  No referral(s) requested today     Note is dictated utilizing voice recognition software. Although note has been proof read prior to signing, occasional typographical errors still can be missed. If any questions arise, please do not hesitate to call for verification.   electronically signed by:  Felix Pacini, DO  [facility] - OR

## 2022-11-24 NOTE — Transitions of Care (Post Inpatient/ED Visit) (Signed)
   11/24/2022  Name: Chase Lowe MRN: [id number] DOB: 1961-06-05  Today's TOC FU Call Status: Today's TOC FU Call Status:: Unsuccessful Call (2nd Attempt) Unsuccessful Call (1st Attempt) Date: 11/22/22 Unsuccessful Call (2nd Attempt) Date: 11/24/22  Attempted to reach the patient regarding the most recent Inpatient/ED visit.  Follow Up Plan: Additional outreach attempts will be made to reach the patient to complete the Transitions of Care (Post Inpatient/ED visit) call.   Signature  Agnes Lawrence, CMA (AAMA)  [facility] [phone]

## 2022-11-25 DIAGNOSIS — M25561 Pain in right knee: Secondary | ICD-10-CM | POA: Diagnosis not present

## 2022-11-25 DIAGNOSIS — M25661 Stiffness of right knee, not elsewhere classified: Secondary | ICD-10-CM | POA: Diagnosis not present

## 2022-11-25 NOTE — Discharge Summary (Signed)
Patient ID: [name] MRN: [id number] DOB/AGE: 1960/08/02 62 y.o.  Admit date: 11/18/2022 Discharge date: 11/19/2022  Admission Diagnoses:  Failed right total knee arthroplasty   Discharge Diagnoses:  Principal Problem:   S/P revision of total knee, right   Past Medical History:  Diagnosis Date   Complication of anesthesia    during a colonoscopy pt. woke up during the procedure and also during a hand surgery    Duodenal ulcer 1983   GERD (gastroesophageal reflux disease)    Hay fever    History of adenomatous polyp of colon    2016 (Dr. Meyer Russel)   History of blood product transfusion    Hypertension    PONV (postoperative nausea and vomiting)    Primary osteoarthritis of knees, bilateral    Shingles    Right lateral hip extending down over right thigh and knee, initial episode 04/2021, recurrence x1 after.  No postherpetic neuralgia.    Surgeries: Procedure(s): TOTAL KNEE REVISION FEMORAL COMPONENT ONLY on 11/18/2022   Consultants:   Discharged Condition: Improved  Hospital Course: Chase Lowe is an 62 y.o. male who was admitted 11/18/2022 for operative treatment ofS/P revision of total knee, right. Patient has severe unremitting pain that affects sleep, daily activities, and work/hobbies. After pre-op clearance the patient was taken to the operating room on 11/18/2022 and underwent  Procedure(s): TOTAL KNEE REVISION FEMORAL COMPONENT ONLY.    Patient was given perioperative antibiotics:  Anti-infectives (From admission, onward)    Start     Dose/Rate Route Frequency Ordered Stop   11/18/22 2200  valACYclovir (VALTREX) tablet 1,000 mg  Status:  Discontinued        1,000 mg Oral 2 times daily 11/18/22 1353 11/19/22 1528   11/18/22 1600  ceFAZolin (ANCEF) IVPB 2g/100 mL premix        2 g 200 mL/hr over 30 Minutes Intravenous Every 6 hours 11/18/22 1353 11/18/22 2258   11/18/22 0745  ceFAZolin (ANCEF) IVPB 2g/100 mL premix        2 g 200 mL/hr over 30  Minutes Intravenous On call to O.R. 11/18/22 0733 11/18/22 1046        Patient was given sequential compression devices, early ambulation, and chemoprophylaxis to prevent DVT. Patient worked with PT and was meeting their goals regarding safe ambulation and transfers.  Patient benefited maximally from hospital stay and there were no complications.    Recent vital signs: No data found.   Recent laboratory studies: No results for input(s): "WBC", "HGB", "HCT", "PLT", "NA", "K", "CL", "CO2", "BUN", "CREATININE", "GLUCOSE", "INR", "CALCIUM" in the last 72 hours.  Invalid input(s): "PT", "2"   Discharge Medications:   Allergies as of 11/19/2022       Reactions   Aspirin Other (See Comments)   High doses contraindicated due to ulcer   Codeine Nausea And Vomiting   Oxycodone-acetaminophen Nausea And Vomiting        Medication List     STOP taking these medications    aspirin EC 81 MG tablet Replaced by: aspirin 81 MG chewable tablet       TAKE these medications    aspirin 81 MG chewable tablet Chew 1 tablet (81 mg total) by mouth 2 (two) times daily for 28 days. Replaces: aspirin EC 81 MG tablet   cetirizine 10 MG tablet Commonly known as: ZYRTEC Take 10 mg by mouth daily as needed for allergies.   lisinopril 5 MG tablet Commonly known as: ZESTRIL Take 1 tablet by mouth  daily.   MULTIVITAMIN PO Take 1 tablet by mouth daily.   valACYclovir 1000 MG tablet Commonly known as: Valtrex Take 1 tablet (1,000 mg total) by mouth 2 (two) times daily for 15 days               Discharge Care Instructions  (From admission, onward)           Start     Ordered   11/19/22 0000  Change dressing       Comments: Maintain surgical dressing until follow up in the clinic. If the edges start to pull up, may reinforce with tape. If the dressing is no longer working, may remove and cover with gauze and tape, but must keep the area dry and clean.  Call with any questions or  concerns.   11/19/22 0731            Diagnostic Studies: No results found.  Disposition: Discharge disposition: 01-Home or Self Care       Discharge Instructions     Call MD / Call 911   Complete by: As directed    If you experience chest pain or shortness of breath, CALL 911 and be transported to the hospital emergency room.  If you develope a fever above 101 F, pus (white drainage) or increased drainage or redness at the wound, or calf pain, call your surgeon's office.   Change dressing   Complete by: As directed    Maintain surgical dressing until follow up in the clinic. If the edges start to pull up, may reinforce with tape. If the dressing is no longer working, may remove and cover with gauze and tape, but must keep the area dry and clean.  Call with any questions or concerns.   Constipation Prevention   Complete by: As directed    Drink plenty of fluids.  Prune juice may be helpful.  You may use a stool softener, such as Colace (over the counter) 100 mg twice a day.  Use MiraLax (over the counter) for constipation as needed.   Diet - low sodium heart healthy   Complete by: As directed    Increase activity slowly as tolerated   Complete by: As directed    Weight bearing as tolerated with assist device (walker, cane, etc) as directed, use it as long as suggested by your surgeon or therapist, typically at least 4-6 weeks.   Post-operative opioid taper instructions:   Complete by: As directed    POST-OPERATIVE OPIOID TAPER INSTRUCTIONS: It is important to wean off of your opioid medication as soon as possible. If you do not need pain medication after your surgery it is ok to stop day one. Opioids include: Codeine, Hydrocodone(Norco, Vicodin), Oxycodone(Percocet, oxycontin) and hydromorphone amongst others.  Long term and even short term use of opiods can cause: Increased pain response Dependence Constipation Depression Respiratory depression And more.  Withdrawal  symptoms can include Flu like symptoms Nausea, vomiting And more Techniques to manage these symptoms Hydrate well Eat regular healthy meals Stay active Use relaxation techniques(deep breathing, meditating, yoga) Do Not substitute Alcohol to help with tapering If you have been on opioids for less than two weeks and do not have pain than it is ok to stop all together.  Plan to wean off of opioids This plan should start within one week post op of your joint replacement. Maintain the same interval or time between taking each dose and first decrease the dose.  Cut the total daily intake of  opioids by one tablet each day Next start to increase the time between doses. The last dose that should be eliminated is the evening dose.      TED hose   Complete by: As directed    Use stockings (TED hose) for 2 weeks on both leg(s).  You may remove them at night for sleeping.        Follow-up Information     Durene Romans, MD. Schedule an appointment as soon as possible for a visit in 2 week(s).   Specialty: Orthopedic Surgery Contact information: [address] [place] [address] [phone]                  Signed: Cassandria Anger 11/25/2022, 9:43 AM

## 2022-11-30 DIAGNOSIS — M25661 Stiffness of right knee, not elsewhere classified: Secondary | ICD-10-CM | POA: Diagnosis not present

## 2022-11-30 DIAGNOSIS — M25561 Pain in right knee: Secondary | ICD-10-CM | POA: Diagnosis not present

## 2022-12-01 NOTE — Transitions of Care (Post Inpatient/ED Visit) (Signed)
   12/01/2022  Name: Chase Lowe MRN: [id number] DOB: 11-Oct-1960  Today's TOC FU Call Status: Today's TOC FU Call Status:: Successful TOC FU Call Competed Unsuccessful Call (1st Attempt) Date: 11/22/22 Unsuccessful Call (2nd Attempt) Date: 11/24/22 Unsuccessful Call (3rd Attempt) Date: 12/01/22 [facility] FU Call Complete Date: 12/01/22  Attempted to reach the patient regarding the most recent Inpatient/ED visit.  Follow Up Plan: No further outreach attempts will be made at this time. We have been unable to contact the patient.  Signature Agnes Lawrence, CMA (AAMA)  [facility] [phone]

## 2022-12-02 ENCOUNTER — Other Ambulatory Visit (HOSPITAL_COMMUNITY): Payer: Self-pay

## 2022-12-02 DIAGNOSIS — M25561 Pain in right knee: Secondary | ICD-10-CM | POA: Diagnosis not present

## 2022-12-02 DIAGNOSIS — M25661 Stiffness of right knee, not elsewhere classified: Secondary | ICD-10-CM | POA: Diagnosis not present

## 2022-12-02 MED ORDER — CELECOXIB 200 MG PO CAPS
200.0000 mg | ORAL_CAPSULE | Freq: Every day | ORAL | 0 refills | Status: DC
  Filled 2022-12-02: qty 30, 30d supply, fill #0

## 2022-12-07 DIAGNOSIS — M25661 Stiffness of right knee, not elsewhere classified: Secondary | ICD-10-CM | POA: Diagnosis not present

## 2022-12-09 DIAGNOSIS — M25561 Pain in right knee: Secondary | ICD-10-CM | POA: Diagnosis not present

## 2022-12-09 DIAGNOSIS — M25661 Stiffness of right knee, not elsewhere classified: Secondary | ICD-10-CM | POA: Diagnosis not present

## 2022-12-14 ENCOUNTER — Other Ambulatory Visit: Payer: Self-pay

## 2022-12-14 ENCOUNTER — Other Ambulatory Visit: Payer: Self-pay | Admitting: Family Medicine

## 2022-12-14 ENCOUNTER — Other Ambulatory Visit (HOSPITAL_COMMUNITY): Payer: Self-pay

## 2022-12-14 DIAGNOSIS — M25561 Pain in right knee: Secondary | ICD-10-CM | POA: Diagnosis not present

## 2022-12-14 MED ORDER — LISINOPRIL 5 MG PO TABS
5.0000 mg | ORAL_TABLET | Freq: Every day | ORAL | 1 refills | Status: DC
  Filled 2022-12-14: qty 90, 90d supply, fill #0
  Filled 2023-03-10: qty 90, 90d supply, fill #1

## 2022-12-16 DIAGNOSIS — M25561 Pain in right knee: Secondary | ICD-10-CM | POA: Diagnosis not present

## 2022-12-16 DIAGNOSIS — M25661 Stiffness of right knee, not elsewhere classified: Secondary | ICD-10-CM | POA: Diagnosis not present

## 2022-12-21 DIAGNOSIS — M25661 Stiffness of right knee, not elsewhere classified: Secondary | ICD-10-CM | POA: Diagnosis not present

## 2022-12-21 DIAGNOSIS — M25561 Pain in right knee: Secondary | ICD-10-CM | POA: Diagnosis not present

## 2022-12-31 DIAGNOSIS — M25661 Stiffness of right knee, not elsewhere classified: Secondary | ICD-10-CM | POA: Diagnosis not present

## 2022-12-31 DIAGNOSIS — M25561 Pain in right knee: Secondary | ICD-10-CM | POA: Diagnosis not present

## 2023-01-04 DIAGNOSIS — M25661 Stiffness of right knee, not elsewhere classified: Secondary | ICD-10-CM | POA: Diagnosis not present

## 2023-01-07 DIAGNOSIS — M25661 Stiffness of right knee, not elsewhere classified: Secondary | ICD-10-CM | POA: Diagnosis not present

## 2023-01-07 DIAGNOSIS — M25561 Pain in right knee: Secondary | ICD-10-CM | POA: Diagnosis not present

## 2023-01-10 ENCOUNTER — Other Ambulatory Visit (HOSPITAL_COMMUNITY): Payer: Self-pay

## 2023-01-10 DIAGNOSIS — Z471 Aftercare following joint replacement surgery: Secondary | ICD-10-CM | POA: Diagnosis not present

## 2023-01-10 DIAGNOSIS — Z96651 Presence of right artificial knee joint: Secondary | ICD-10-CM | POA: Diagnosis not present

## 2023-01-10 DIAGNOSIS — M1712 Unilateral primary osteoarthritis, left knee: Secondary | ICD-10-CM | POA: Diagnosis not present

## 2023-01-10 MED ORDER — PREDNISONE 5 MG (48) PO TBPK
ORAL_TABLET | ORAL | 0 refills | Status: DC
  Filled 2023-01-10: qty 48, 12d supply, fill #0

## 2023-01-12 ENCOUNTER — Ambulatory Visit (INDEPENDENT_AMBULATORY_CARE_PROVIDER_SITE_OTHER): Payer: Commercial Managed Care - PPO | Admitting: Family Medicine

## 2023-01-12 ENCOUNTER — Encounter: Payer: Self-pay | Admitting: Family Medicine

## 2023-01-12 VITALS — BP 128/82 | HR 71 | Ht 71.0 in | Wt 202.2 lb

## 2023-01-12 DIAGNOSIS — Z1211 Encounter for screening for malignant neoplasm of colon: Secondary | ICD-10-CM | POA: Diagnosis not present

## 2023-01-12 DIAGNOSIS — Z0000 Encounter for general adult medical examination without abnormal findings: Secondary | ICD-10-CM | POA: Diagnosis not present

## 2023-01-12 DIAGNOSIS — I10 Essential (primary) hypertension: Secondary | ICD-10-CM | POA: Diagnosis not present

## 2023-01-12 DIAGNOSIS — Z125 Encounter for screening for malignant neoplasm of prostate: Secondary | ICD-10-CM

## 2023-01-12 DIAGNOSIS — Z13220 Encounter for screening for lipoid disorders: Secondary | ICD-10-CM

## 2023-01-12 LAB — COMPREHENSIVE METABOLIC PANEL
ALT: 20 U/L (ref 0–53)
AST: 21 U/L (ref 0–37)
Albumin: 4.8 g/dL (ref 3.5–5.2)
Alkaline Phosphatase: 90 U/L (ref 39–117)
BUN: 13 mg/dL (ref 6–23)
CO2: 26 mEq/L (ref 19–32)
Calcium: 10 mg/dL (ref 8.4–10.5)
Chloride: 102 mEq/L (ref 96–112)
Creatinine, Ser: 0.98 mg/dL (ref 0.40–1.50)
GFR: 82.41 mL/min (ref >60.00)
Glucose, Bld: 112 mg/dL — ABNORMAL HIGH (ref 70–99)
Potassium: 4 mEq/L (ref 3.5–5.1)
Sodium: 138 mEq/L (ref 135–145)
Total Bilirubin: 0.6 mg/dL (ref 0.2–1.2)
Total Protein: 7.7 g/dL (ref 6.0–8.3)

## 2023-01-12 LAB — PSA: PSA: 0.86 ng/mL (ref 0.10–4.00)

## 2023-01-12 LAB — CBC
HCT: 40.6 % (ref 39.0–52.0)
Hemoglobin: 13.4 g/dL (ref 13.0–17.0)
MCHC: 33.1 g/dL (ref 30.0–36.0)
MCV: 93.1 fl (ref 78.0–100.0)
Platelets: 153 10*3/uL (ref 150.0–400.0)
RBC: 4.36 Mil/uL (ref 4.22–5.81)
RDW: 14.2 % (ref 11.5–15.5)
WBC: 8.2 10*3/uL (ref 4.0–10.5)

## 2023-01-12 LAB — LIPID PANEL
Cholesterol: 186 mg/dL (ref 0–200)
HDL: 55.6 mg/dL (ref >39.00)
LDL Cholesterol: 114 mg/dL — ABNORMAL HIGH (ref 0–99)
NonHDL: 130.01
Total CHOL/HDL Ratio: 3
Triglycerides: 82 mg/dL (ref 0.0–149.0)
VLDL: 16.4 mg/dL (ref 0.0–40.0)

## 2023-01-12 NOTE — Progress Notes (Signed)
Office Note 01/12/2023  CC:  Chief Complaint  Patient presents with   Annual Exam    Pt is fasting.     HPI:  Patient is a 62 y.o. male who is here for annual health maintenance exam and follow-up hypertension.  Chase Lowe is doing well. He got right knee revision surgery about 2 months ago and states that it helped quite a bit.  He has had some ongoing postop swelling and some pain and stiffness and was started on prednisone 3 days ago and notes that it has been significantly improved.  Home blood pressures are consistently in the 120s over 70s average.  Past Medical History:  Diagnosis Date   Complication of anesthesia    during a colonoscopy pt. woke up during the procedure and also during a hand surgery    Duodenal ulcer 1983   GERD (gastroesophageal reflux disease)    Hay fever    History of adenomatous polyp of colon    2016 (Dr. Randa Evens, [facility])   History of blood product transfusion    Hypertension    PONV (postoperative nausea and vomiting)    Primary osteoarthritis of knees, bilateral    Shingles    Right lateral hip extending down over right thigh and knee, initial episode 04/2021, recurrence x1 after.  No postherpetic neuralgia.    Past Surgical History:  Procedure Laterality Date   ANTERIOR CERVICAL DECOMP/DISCECTOMY FUSION N/A 06/30/2017   Procedure: ANTERIOR CERVICAL DECOMPRESSION/DISCECTOMY FUSION C5-6;  Surgeon: Venita Lick, MD;  Location: [facility] OR;  Service: Orthopedics;  Laterality: N/A;  3 hrs   COLONOSCOPY  [id number]   09/02/14 polyps x 3 ([facility])   GANGLION CYST EXCISION Left 2006   HEEL SPUR SURGERY Right 2013   HEEL SPUR SURGERY Right    2nd surgery   INGUINAL HERNIA REPAIR Left 04/26/2014   Procedure: LEFT INGUINAL HERNIA REPAIR WITH MESH;  Surgeon: Abigail Miyamoto, MD;  Location: [facility] OR;  Service: General;  Laterality: Left;   INGUINAL HERNIA REPAIR Right 02/20/2016   Procedure: RIGHT INGUINAL HERNIA REPAIR WITH MESH;  Surgeon: Abigail Miyamoto,  MD;  Location: [facility] OR;  Service: General;  Laterality: Right;   INSERTION OF MESH Left 04/26/2014   Procedure: INSERTION OF MESH;  Surgeon: Abigail Miyamoto, MD;  Location: [facility] OR;  Service: General;  Laterality: Left;   INSERTION OF MESH Right 02/20/2016   Procedure: INSERTION OF MESH;  Surgeon: Abigail Miyamoto, MD;  Location: [facility] OR;  Service: General;  Laterality: Right;   KNEE ARTHROSCOPY Left 02/2015   left   KNEE ARTHROSCOPY Left 12/2015   KNEE ARTHROSCOPY Right    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   TOTAL KNEE REVISION Right 11/18/2022   Procedure: TOTAL KNEE REVISION FEMORAL COMPONENT ONLY;  Surgeon: Durene Romans, MD;  Location: [facility] ORS;  Service: Orthopedics;  Laterality: Right;   WRIST SURGERY      Family History  Problem Relation Age of Onset   Scoliosis Mother    Healthy Brother     Social History   Socioeconomic History   Marital status: Married    Spouse name: Not on file   Number of children: Not on file   Years of education: Not on file   Highest education level: 12th grade  Occupational History   Not on file  Tobacco Use   Smoking status: Former    Years: 17    Types: Cigarettes    Quit date: 1989    Years since quitting: 35.5    Passive  exposure: Never   Smokeless tobacco: Never   Tobacco comments:    quit 1989  Vaping Use   Vaping Use: Never used  Substance and Sexual Activity   Alcohol use: Yes    Alcohol/week: 6.0 - 12.0 standard drinks of alcohol    Types: 6 - 12 Cans of beer per week    Comment: occasional   Drug use: No   Sexual activity: Not on file  Other Topics Concern   Not on file  Social History Narrative   Married,   Educ: HS   Occup: sales   Social Determinants of Health   Financial Resource Strain: Low Risk  (07/11/2022)   Overall Financial Resource Strain (CARDIA)    Difficulty of Paying Living Expenses: Not hard at all  Food Insecurity: No Food Insecurity (11/18/2022)   Hunger Vital Sign    Worried About Running Out of  Food in the Last Year: Never true    Ran Out of Food in the Last Year: Never true  Transportation Needs: No Transportation Needs (11/18/2022)   PRAPARE - Administrator, Civil Service (Medical): No    Lack of Transportation (Non-Medical): No  Physical Activity: Sufficiently Active (07/11/2022)   Exercise Vital Sign    Days of Exercise per Week: 5 days    Minutes of Exercise per Session: 30 min  Stress: No Stress Concern Present (07/11/2022)   [employer] of Occupational Health - Occupational Stress Questionnaire    Feeling of Stress : Only a little  Social Connections: Moderately Isolated (07/11/2022)   Social Connection and Isolation Panel [NHANES]    Frequency of Communication with Friends and Family: More than three times a week    Frequency of Social Gatherings with Friends and Family: Once a week    Attends Religious Services: Never    Database administrator or Organizations: No    Attends Engineer, structural: Not on file    Marital Status: Married  Catering manager Violence: Not At Risk (11/18/2022)   Humiliation, Afraid, Rape, and Kick questionnaire    Fear of Current or Ex-Partner: No    Emotionally Abused: No    Physically Abused: No    Sexually Abused: No    Outpatient Medications Prior to Visit  Medication Sig Dispense Refill   aspirin EC 81 MG tablet Take 81 mg by mouth daily. Swallow whole.     lisinopril (ZESTRIL) 5 MG tablet Take 1 tablet (5 mg total) by mouth daily. 90 tablet 1   Multiple Vitamins-Minerals (MULTIVITAMIN PO) Take 1 tablet by mouth daily.      predniSONE (STERAPRED UNI-PAK 48 TAB) 5 MG (48) TBPK tablet Take as directed per package instructions. 48 tablet 0   amoxicillin-clavulanate (AUGMENTIN) 875-125 MG tablet Take 1 tablet by mouth 2 (two) times daily. 20 tablet 0   celecoxib (CELEBREX) 200 MG capsule Take 1 capsule (200 mg total) by mouth daily for 30 days. 30 capsule 0   cetirizine (ZYRTEC) 10 MG tablet Take 10 mg by mouth  daily as needed for allergies.     fluticasone (FLONASE) 50 MCG/ACT nasal spray Place 2 sprays into both nostrils daily. 16 g 6   valACYclovir (VALTREX) 1000 MG tablet Take 1 tablet (1,000 mg total) by mouth 2 (two) times daily for 15 days 30 tablet 0   No facility-administered medications prior to visit.    Allergies  Allergen Reactions   Aspirin Other (See Comments)    High doses contraindicated due to  ulcer   Codeine Nausea And Vomiting   Oxycodone-Acetaminophen Nausea And Vomiting    Review of Systems  Constitutional:  Negative for appetite change, chills, fatigue and fever.  HENT:  Negative for congestion, dental problem, ear pain and sore throat.   Eyes:  Negative for discharge, redness and visual disturbance.  Respiratory:  Negative for cough, chest tightness, shortness of breath and wheezing.   Cardiovascular:  Negative for chest pain, palpitations and leg swelling.  Gastrointestinal:  Negative for abdominal pain, blood in stool, diarrhea, nausea and vomiting.  Genitourinary:  Negative for difficulty urinating, dysuria, flank pain, frequency, hematuria and urgency.  Musculoskeletal:  Positive for arthralgias (right knee) and joint swelling (right knee). Negative for back pain, myalgias and neck stiffness.  Skin:  Negative for pallor and rash.  Neurological:  Negative for dizziness, speech difficulty, weakness and headaches.  Hematological:  Negative for adenopathy. Does not bruise/bleed easily.  Psychiatric/Behavioral:  Negative for confusion and sleep disturbance. The patient is not nervous/anxious.    PE;    01/12/2023    8:09 AM 01/12/2023    8:04 AM 11/23/2022   10:54 AM  Vitals with BMI  Height  5\' 11"    Weight  202 lbs 3 oz 204 lbs 6 oz  BMI  28.21 28.52  Systolic 128 150 161  Diastolic 82 98 88  Pulse  71 88  Initial bp today 150/98 Rpt manual 128/82 P 71  Gen: Alert, well appearing.  Patient is oriented to person, place, time, and situation. AFFECT:  pleasant, lucid thought and speech. ENT: Ears: EACs clear, normal epithelium.  TMs with good light reflex and landmarks bilaterally.  Eyes: no injection, icteris, swelling, or exudate.  EOMI, PERRLA. Nose: no drainage or turbinate edema/swelling.  No injection or focal lesion.  Mouth: lips without lesion/swelling.  Oral mucosa pink and moist.  Dentition intact and without obvious caries or gingival swelling.  Oropharynx without erythema, exudate, or swelling.  Neck: supple/nontender.  No LAD, mass, or TM.  Carotid pulses 2+ bilaterally, without bruits. CV: RRR, no m/r/g.   LUNGS: CTA bilat, nonlabored resps, good aeration in all lung fields. ABD: soft, NT, ND, BS normal.  No hepatospenomegaly or mass.  No bruits. EXT: no clubbing, cyanosis, or edema.  Musculoskeletal: Right knee with mild swelling and warmth.  No tenderness or erythema. Otherwise, no joint swelling, erythema, warmth, or tenderness.  ROM of all joints intact. Skin - no sores or suspicious lesions or rashes or color changes  Pertinent labs:   Lab Results  Component Value Date   WBC 6.9 11/19/2022   HGB 10.9 (L) 11/19/2022   HCT 31.7 (L) 11/19/2022   MCV 94.3 11/19/2022   PLT 123 (L) 11/19/2022   Lab Results  Component Value Date   CREATININE 1.03 11/19/2022   BUN 16 11/19/2022   NA 139 11/19/2022   K 5.1 11/19/2022   CL 107 11/19/2022   CO2 26 11/19/2022   Lab Results  Component Value Date   ALT 35 12/25/2021   AST 32 12/25/2021   ALKPHOS 72 12/25/2021   BILITOT 0.6 12/25/2021   Lab Results  Component Value Date   CHOL 190 12/25/2021   Lab Results  Component Value Date   HDL 51.20 12/25/2021   Lab Results  Component Value Date   LDLCALC 101 (H) 12/25/2021   Lab Results  Component Value Date   TRIG 191.0 (H) 12/25/2021   Lab Results  Component Value Date   CHOLHDL 4 12/25/2021  Lab Results  Component Value Date   PSA 0.60 12/25/2021   Lab Results  Component Value Date   HGBA1C 5.3  03/04/2021   ASSESSMENT AND PLAN:   #1 health maintenance exam: Reviewed age and gender appropriate health maintenance issues (prudent diet, regular exercise, health risks of tobacco and excessive alcohol, use of seatbelts, fire alarms in home, use of sunscreen).  Also reviewed age and gender appropriate health screening as well as vaccine recommendations. Vaccines:  ALL UTD Labs: cbc, cmet, flp, psa Prostate ca screening: psa Colon ca screening: History of adenomatous colon polyp.  Last colonoscopy was approximately 5 years ago with [facility]. Patient would like referral to different gastroenterology office--referral to digestive health specialist ordered today. He is facing possible ALL out of pocket expense for this so he is still checking with insurer.  #2 hypertension, well-controlled on lisinopril 5 mg a day. Electrolytes and creatinine today.  An After Visit Summary was printed and given to the patient.  FOLLOW UP:  Return in about 6 months (around 07/15/2023) for routine chronic illness f/u.  Signed:  Santiago Bumpers, MD           01/12/2023

## 2023-01-12 NOTE — Patient Instructions (Signed)
Health Maintenance, Male Adopting a healthy lifestyle and getting preventive care are important in promoting health and wellness. Ask your health care provider about: The right schedule for you to have regular tests and exams. Things you can do on your own to prevent diseases and keep yourself healthy. What should I know about diet, weight, and exercise? Eat a healthy diet  Eat a diet that includes plenty of vegetables, fruits, low-fat dairy products, and lean protein. Do not eat a lot of foods that are high in solid fats, added sugars, or sodium. Maintain a healthy weight Body mass index (BMI) is a measurement that can be used to identify possible weight problems. It estimates body fat based on height and weight. Your health care provider can help determine your BMI and help you achieve or maintain a healthy weight. Get regular exercise Get regular exercise. This is one of the most important things you can do for your health. Most adults should: Exercise for at least 150 minutes each week. The exercise should increase your heart rate and make you sweat (moderate-intensity exercise). Do strengthening exercises at least twice a week. This is in addition to the moderate-intensity exercise. Spend less time sitting. Even light physical activity can be beneficial. Watch cholesterol and blood lipids Have your blood tested for lipids and cholesterol at 63 years of age, then have this test every 5 years. You may need to have your cholesterol levels checked more often if: Your lipid or cholesterol levels are high. You are older than 63 years of age. You are at high risk for heart disease. What should I know about cancer screening? Many types of cancers can be detected early and may often be prevented. Depending on your health history and family history, you may need to have cancer screening at various ages. This may include screening for: Colorectal cancer. Prostate cancer. Skin cancer. Lung  cancer. What should I know about heart disease, diabetes, and high blood pressure? Blood pressure and heart disease High blood pressure causes heart disease and increases the risk of stroke. This is more likely to develop in people who have high blood pressure readings or are overweight. Talk with your health care provider about your target blood pressure readings. Have your blood pressure checked: Every 3-5 years if you are 18-39 years of age. Every year if you are 40 years old or older. If you are between the ages of 65 and 75 and are a current or former smoker, ask your health care provider if you should have a one-time screening for abdominal aortic aneurysm (AAA). Diabetes Have regular diabetes screenings. This checks your fasting blood sugar level. Have the screening done: Once every three years after age 45 if you are at a normal weight and have a low risk for diabetes. More often and at a younger age if you are overweight or have a high risk for diabetes. What should I know about preventing infection? Hepatitis B If you have a higher risk for hepatitis B, you should be screened for this virus. Talk with your health care provider to find out if you are at risk for hepatitis B infection. Hepatitis C Blood testing is recommended for: Everyone born from 1945 through 1965. Anyone with known risk factors for hepatitis C. Sexually transmitted infections (STIs) You should be screened each year for STIs, including gonorrhea and chlamydia, if: You are sexually active and are younger than 63 years of age. You are older than 63 years of age and your   health care provider tells you that you are at risk for this type of infection. Your sexual activity has changed since you were last screened, and you are at increased risk for chlamydia or gonorrhea. Ask your health care provider if you are at risk. Ask your health care provider about whether you are at high risk for HIV. Your health care provider  may recommend a prescription medicine to help prevent HIV infection. If you choose to take medicine to prevent HIV, you should first get tested for HIV. You should then be tested every 3 months for as long as you are taking the medicine. Follow these instructions at home: Alcohol use Do not drink alcohol if your health care provider tells you not to drink. If you drink alcohol: Limit how much you have to 0-2 drinks a day. Know how much alcohol is in your drink. In the [place] one drink equals one 12 oz bottle of beer (355 mL), one 5 oz glass of wine (148 mL), or one 1 oz glass of hard liquor (44 mL). Lifestyle Do not use any products that contain nicotine or tobacco. These products include cigarettes, chewing tobacco, and vaping devices, such as e-cigarettes. If you need help quitting, ask your health care provider. Do not use street drugs. Do not share needles. Ask your health care provider for help if you need support or information about quitting drugs. General instructions Schedule regular health, dental, and eye exams. Stay current with your vaccines. Tell your health care provider if: You often feel depressed. You have ever been abused or do not feel safe at home. Summary Adopting a healthy lifestyle and getting preventive care are important in promoting health and wellness. Follow your health care provider's instructions about healthy diet, exercising, and getting tested or screened for diseases. Follow your health care provider's instructions on monitoring your cholesterol and blood pressure. This information is not intended to replace advice given to you by your health care provider. Make sure you discuss any questions you have with your health care provider. Document Revised: 11/10/2020 Document Reviewed: 11/10/2020 Elsevier Patient Education  2024 Elsevier Inc.  

## 2023-01-13 ENCOUNTER — Other Ambulatory Visit (INDEPENDENT_AMBULATORY_CARE_PROVIDER_SITE_OTHER): Payer: Commercial Managed Care - PPO

## 2023-01-13 ENCOUNTER — Encounter: Payer: Self-pay | Admitting: Family Medicine

## 2023-01-13 DIAGNOSIS — M25661 Stiffness of right knee, not elsewhere classified: Secondary | ICD-10-CM | POA: Diagnosis not present

## 2023-01-13 DIAGNOSIS — R7301 Impaired fasting glucose: Secondary | ICD-10-CM

## 2023-01-13 DIAGNOSIS — M25561 Pain in right knee: Secondary | ICD-10-CM | POA: Diagnosis not present

## 2023-01-13 LAB — HEMOGLOBIN A1C: Hgb A1c MFr Bld: 5.1 % (ref 4.6–6.5)

## 2023-01-14 ENCOUNTER — Encounter: Payer: Self-pay | Admitting: Family Medicine

## 2023-01-14 ENCOUNTER — Telehealth: Payer: Self-pay | Admitting: Family Medicine

## 2023-01-14 NOTE — Telephone Encounter (Signed)
Patient reports that he received a message via Mychart from Dr. Milinda Cave, however he could not figure out how to message back. He wanted to inquire if he can only take the medication for his cholesterol for 30 days and maybe recheck it or control it with diet since it was mildly high? Please give the patient a call to discuss possible options.

## 2023-01-14 NOTE — Telephone Encounter (Signed)
If he started the medicine the idea would be to take it indefinitely. However, I think it is just fine to hold off on medication and give him 6 months to make some dietary improvements and start exercising again and we will recheck cholesterol.

## 2023-01-14 NOTE — Telephone Encounter (Signed)
Left pt a vm and resent the complete message in Mychart.

## 2023-01-14 NOTE — Telephone Encounter (Signed)
Spoke to pt and he wants to see if he can take the meds for 30 days and change his diet or will the medication be for long term? He states he has not been able to exercise due to recent knee surgery.

## 2023-01-14 NOTE — Telephone Encounter (Signed)
Pt advised.

## 2023-01-17 DIAGNOSIS — M25661 Stiffness of right knee, not elsewhere classified: Secondary | ICD-10-CM | POA: Diagnosis not present

## 2023-01-17 DIAGNOSIS — M25561 Pain in right knee: Secondary | ICD-10-CM | POA: Diagnosis not present

## 2023-01-20 DIAGNOSIS — M25561 Pain in right knee: Secondary | ICD-10-CM | POA: Diagnosis not present

## 2023-01-20 DIAGNOSIS — M25661 Stiffness of right knee, not elsewhere classified: Secondary | ICD-10-CM | POA: Diagnosis not present

## 2023-01-23 ENCOUNTER — Telehealth: Payer: Commercial Managed Care - PPO | Admitting: Nurse Practitioner

## 2023-01-23 DIAGNOSIS — B029 Zoster without complications: Secondary | ICD-10-CM

## 2023-01-24 DIAGNOSIS — M25561 Pain in right knee: Secondary | ICD-10-CM | POA: Diagnosis not present

## 2023-01-24 DIAGNOSIS — M25661 Stiffness of right knee, not elsewhere classified: Secondary | ICD-10-CM | POA: Diagnosis not present

## 2023-01-24 MED ORDER — VALACYCLOVIR HCL 1 G PO TABS
1000.0000 mg | ORAL_TABLET | Freq: Three times a day (TID) | ORAL | 0 refills | Status: AC
Start: 2023-01-24 — End: 2023-01-31

## 2023-01-24 MED ORDER — GABAPENTIN 300 MG PO CAPS
300.0000 mg | ORAL_CAPSULE | Freq: Two times a day (BID) | ORAL | 0 refills | Status: DC | PRN
Start: 2023-01-24 — End: 2023-03-02

## 2023-01-24 NOTE — Progress Notes (Signed)
E-visit for Shingles   We are sorry that you are not feeling well. Here is how we plan to help!  Based on what you shared with me it looks like you have shingles.  Shingles or herpes zoster, is a common infection of the nerves.  It is a painful rash caused by the herpes zoster virus.  This is the same virus that causes chickenpox.  After a person has chickenpox, the virus remains inactive in the nerve cells.  Years later, the virus can become active again and travel to the skin.  It typically will appear on one side of the face or body.  Burning or shooting pain, tingling, or itching are early signs of the infection.  Blisters typically scab over in 7 to 10 days and clear up within 2-4 weeks. Shingles is only contagious to people that have never had the chickenpox, the chickenpox vaccine, or anyone who has a compromised immune system.  You should avoid contact with these type of people until your blisters scab over.  I have prescribed Valacyclovir 1g three times daily for 7 days and also Gabapentin 300mg  twice daily as needed for pain   HOME CARE: Apply ice packs (wrapped in a thin towel), cool compresses, or soak in cool bath to help reduce pain. Use calamine lotion to calm itchy skin. Avoid scratching the rash. Avoid direct sunlight.  GET HELP RIGHT AWAY IF: Symptoms that don't away after treatment. A rash or blisters near your eye. Increased drainage, fever, or rash after treatment. Severe pain that doesn't go away.   MAKE SURE YOU   Understand these instructions. Will watch your condition. Will get help right away if you are not doing well or get worse.  Thank you for choosing an e-visit.  Your e-visit answers were reviewed by a board certified advanced clinical practitioner to complete your personal care plan. Depending upon the condition, your plan could have included both over the counter or prescription medications.  Please review your pharmacy choice. Make sure the pharmacy  is open so you can pick up prescription now. If there is a problem, you may contact your provider through [employer] and have the prescription routed to another pharmacy.  Your safety is important to [place]. If you have drug allergies check your prescription carefully.   For the next 24 hours you can use MyChart to ask questions about today's visit, request a non-urgent call back, or ask for a work or school excuse. You will get an email in the next two days asking about your experience. I hope that your e-visit has been valuable and will speed your recovery.   Meds ordered this encounter  Medications   valACYclovir (VALTREX) 1000 MG tablet    Sig: Take 1 tablet (1,000 mg total) by mouth 3 (three) times daily for 7 days.    Dispense:  21 tablet    Refill:  0   gabapentin (NEURONTIN) 300 MG capsule    Sig: Take 1 capsule (300 mg total) by mouth 2 (two) times daily as needed for up to 7 days (pain).    Dispense:  14 capsule    Refill:  0    I spent approximately 5 minutes reviewing the patient's history, current symptoms and coordinating their care today.

## 2023-02-07 ENCOUNTER — Telehealth: Payer: Commercial Managed Care - PPO | Admitting: Nurse Practitioner

## 2023-02-07 DIAGNOSIS — H66009 Acute suppurative otitis media without spontaneous rupture of ear drum, unspecified ear: Secondary | ICD-10-CM

## 2023-02-07 DIAGNOSIS — M25561 Pain in right knee: Secondary | ICD-10-CM | POA: Diagnosis not present

## 2023-02-07 DIAGNOSIS — M25661 Stiffness of right knee, not elsewhere classified: Secondary | ICD-10-CM | POA: Diagnosis not present

## 2023-02-07 MED ORDER — FLUTICASONE PROPIONATE 50 MCG/ACT NA SUSP
2.0000 | Freq: Every day | NASAL | 6 refills | Status: DC
Start: 2023-02-07 — End: 2023-07-03

## 2023-02-07 MED ORDER — AMOXICILLIN 875 MG PO TABS
875.0000 mg | ORAL_TABLET | Freq: Two times a day (BID) | ORAL | 0 refills | Status: AC
Start: 2023-02-07 — End: 2023-02-17

## 2023-02-07 NOTE — Progress Notes (Signed)
E-Visit for Ear Pain - Acute Otitis Media   We are sorry that you are not feeling well. Here is how we plan to help!  Based on what you have shared with me it looks like you have Acute Otitis Media.  Acute Otitis Media is an infection of the middle or "inner" ear. This type of infection can cause redness, inflammation, and fluid buildup behind the tympanic membrane (ear drum).  The usual symptoms include: Earache/Pain Fever Upper respiratory symptoms Lack of energy/Fatigue/Malaise Slight hearing loss gradually worsening- if the inner ear fills with fluid What causes middle ear infections? Most middle ear infections occur when an infection such as a cold, leads to a build-up of mucus in the middle ear and causes the Eustachian tube (a thin tube that runs from the middle ear to the back of the nose) to become swollen or blocked.   This means mucus can't drain away properly, making it easier for an infection to spread into the middle ear.  How middle ear infections are treated: Most ear infections clear up within three to five days and don't need any specific treatment. If necessary, tylenol or ibuprofen should be used to relieve pain and a high temperature.  If you develop a fever higher than 102, or any significantly worsening symptoms, this could indicate a more serious infection moving to the middle/inner and needs face to face evaluation in an office by a provider.   Antibiotics aren't routinely used to treat middle ear infections, although they may occasionally be prescribed if symptoms persist or are particularly severe. Given your presentation,   I have prescribed Amoxicillin 875 mg one tablet twice daily for 10 days Meds ordered this encounter  Medications   amoxicillin (AMOXIL) 875 MG tablet    Sig: Take 1 tablet (875 mg total) by mouth 2 (two) times daily for 10 days.    Dispense:  20 tablet    Refill:  0   fluticasone (FLONASE) 50 MCG/ACT nasal spray    Sig: Place 2 sprays  into both nostrils daily.    Dispense:  16 g    Refill:  6       Your symptoms should improve over the next 3 days and should resolve in about 7 days. Be sure to complete ALL of the prescription(s) given.  HOME CARE: Wash your hands frequently. If you are prescribed an ear drop, do not place the tip of the bottle on your ear or touch it with your fingers. You can take Acetaminophen 650 mg every 4-6 hours as needed for pain.  If pain is severe or moderate, you can apply a heating pad (set on low) or hot water bottle (wrapped in a towel) to outer ear for 20 minutes.  This will also increase drainage.  GET HELP RIGHT AWAY IF: Fever is over 102.2 degrees. You develop progressive ear pain or hearing loss. Ear symptoms persist longer than 3 days after treatment.  MAKE SURE YOU: Understand these instructions. Will watch your condition. Will get help right away if you are not doing well or get worse.  Thank you for choosing an e-visit.  Your e-visit answers were reviewed by a board certified advanced clinical practitioner to complete your personal care plan. Depending upon the condition, your plan could have included both over the counter or prescription medications.  Please review your pharmacy choice. Make sure the pharmacy is open so you can pick up the prescription now. If there is a problem, you may contact  your provider through [employer] and have the prescription routed to another pharmacy.  Your safety is important to [place]. If you have drug allergies check your prescription carefully.   For the next 24 hours you can use MyChart to ask questions about today's visit, request a non-urgent call back, or ask for a work or school excuse. You will get an email with a survey after your eVisit asking about your experience. We would appreciate your feedback. I hope that your e-visit has been valuable and will aid in your recovery.  I spent approximately 5 minutes reviewing the patient's  history, current symptoms and coordinating their care today.

## 2023-02-14 DIAGNOSIS — M1712 Unilateral primary osteoarthritis, left knee: Secondary | ICD-10-CM | POA: Diagnosis not present

## 2023-02-14 DIAGNOSIS — Z96651 Presence of right artificial knee joint: Secondary | ICD-10-CM | POA: Diagnosis not present

## 2023-02-15 ENCOUNTER — Other Ambulatory Visit: Payer: Self-pay

## 2023-02-15 ENCOUNTER — Other Ambulatory Visit (HOSPITAL_COMMUNITY): Payer: Self-pay

## 2023-02-15 MED ORDER — PREDNISONE 5 MG (48) PO TBPK
ORAL_TABLET | ORAL | 0 refills | Status: DC
  Filled 2023-02-15: qty 48, 12d supply, fill #0

## 2023-02-23 DIAGNOSIS — M1712 Unilateral primary osteoarthritis, left knee: Secondary | ICD-10-CM | POA: Diagnosis not present

## 2023-03-02 DIAGNOSIS — M1712 Unilateral primary osteoarthritis, left knee: Secondary | ICD-10-CM | POA: Diagnosis not present

## 2023-03-14 ENCOUNTER — Other Ambulatory Visit (HOSPITAL_COMMUNITY): Payer: Self-pay

## 2023-03-14 ENCOUNTER — Telehealth: Payer: Commercial Managed Care - PPO | Admitting: Physician Assistant

## 2023-03-14 DIAGNOSIS — B029 Zoster without complications: Secondary | ICD-10-CM

## 2023-03-14 MED ORDER — VALACYCLOVIR HCL 1 G PO TABS
1000.0000 mg | ORAL_TABLET | Freq: Three times a day (TID) | ORAL | 0 refills | Status: AC
Start: 2023-03-14 — End: 2023-03-22
  Filled 2023-03-14: qty 21, 7d supply, fill #0

## 2023-03-14 NOTE — Progress Notes (Signed)

## 2023-03-15 ENCOUNTER — Ambulatory Visit (HOSPITAL_COMMUNITY)
Admission: RE | Admit: 2023-03-15 | Payer: Commercial Managed Care - PPO | Source: Home / Self Care | Admitting: Orthopedic Surgery

## 2023-03-15 ENCOUNTER — Encounter (HOSPITAL_COMMUNITY): Admission: RE | Payer: Self-pay | Source: Home / Self Care

## 2023-03-15 ENCOUNTER — Other Ambulatory Visit (HOSPITAL_COMMUNITY): Payer: Self-pay

## 2023-03-15 SURGERY — CARPOMETACARPAL (CMC) FUSION OF THUMB
Anesthesia: Regional | Site: Thumb | Laterality: Left

## 2023-06-06 ENCOUNTER — Other Ambulatory Visit: Payer: Self-pay | Admitting: Family Medicine

## 2023-06-20 ENCOUNTER — Other Ambulatory Visit (HOSPITAL_COMMUNITY): Payer: Self-pay

## 2023-06-20 MED ORDER — LISINOPRIL 5 MG PO TABS
5.0000 mg | ORAL_TABLET | Freq: Every day | ORAL | 0 refills | Status: DC
  Filled 2023-06-20 (×2): qty 90, 90d supply, fill #0

## 2023-06-20 NOTE — Telephone Encounter (Signed)
Refill sent.

## 2023-06-20 NOTE — Telephone Encounter (Signed)
Patient is out of meds. Please refill - patient has appt on 07/15/23

## 2023-06-20 NOTE — Addendum Note (Signed)
Addended by: Leonie Douglas on: 06/20/2023 11:41 AM   Modules accepted: Orders

## 2023-06-21 ENCOUNTER — Other Ambulatory Visit (HOSPITAL_COMMUNITY): Payer: Self-pay

## 2023-07-02 ENCOUNTER — Telehealth: Payer: Commercial Managed Care - PPO | Admitting: Physician Assistant

## 2023-07-02 DIAGNOSIS — H9209 Otalgia, unspecified ear: Secondary | ICD-10-CM

## 2023-07-02 NOTE — Progress Notes (Signed)
   Thank you for the details you included in the comment boxes. Those details are very helpful in determining the best course of treatment for you and help [place] to provide the best care.Because of the symptoms you described, we recommend that you schedule a Virtual Urgent Care video visit in order for the provider to better assess what is going on.  The provider will be able to give you a more accurate diagnosis and treatment plan if we can more freely discuss your symptoms and with the addition of a virtual examination.   If you change your visit to a video visit, we will bill your insurance (similar to an office visit) and you will not be charged for this e-Visit. You will be able to stay at home and speak with the first available [facility] advanced practice provider. The link to do a video visit is in the drop down Menu tab of your Welcome screen in MyChart.

## 2023-07-03 ENCOUNTER — Telehealth: Payer: Commercial Managed Care - PPO | Admitting: Family

## 2023-07-03 DIAGNOSIS — J0100 Acute maxillary sinusitis, unspecified: Secondary | ICD-10-CM | POA: Diagnosis not present

## 2023-07-03 MED ORDER — FLUTICASONE PROPIONATE 50 MCG/ACT NA SUSP
2.0000 | Freq: Every day | NASAL | 6 refills | Status: DC
Start: 2023-07-03 — End: 2023-12-01
  Filled 2023-09-27: qty 16, 30d supply, fill #0
  Filled 2023-10-23: qty 16, 30d supply, fill #1

## 2023-07-03 MED ORDER — AMOXICILLIN-POT CLAVULANATE 875-125 MG PO TABS
1.0000 | ORAL_TABLET | Freq: Two times a day (BID) | ORAL | 0 refills | Status: DC
Start: 2023-07-03 — End: 2023-07-15

## 2023-07-03 NOTE — Progress Notes (Signed)
Virtual Visit Consent   Chase Lowe, you are scheduled for a virtual visit with a [facility] provider today. Just as with appointments in the office, your consent must be obtained to participate. Your consent will be active for this visit and any virtual visit you may have with one of our providers in the next 365 days. If you have a MyChart account, a copy of this consent can be sent to you electronically.  As this is a virtual visit, video technology does not allow for your provider to perform a traditional examination. This may limit your provider's ability to fully assess your condition. If your provider identifies any concerns that need to be evaluated in person or the need to arrange testing (such as labs, EKG, etc.), we will make arrangements to do so. Although advances in technology are sophisticated, we cannot ensure that it will always work on either your end or our end. If the connection with a video visit is poor, the visit may have to be switched to a telephone visit. With either a video or telephone visit, we are not always able to ensure that we have a secure connection.  By engaging in this virtual visit, you consent to the provision of healthcare and authorize for your insurance to be billed (if applicable) for the services provided during this visit. Depending on your insurance coverage, you may receive a charge related to this service.  I need to obtain your verbal consent now. Are you willing to proceed with your visit today? Chase Lowe has provided verbal consent on 07/03/2023 for a virtual visit (video or telephone). Jannifer Rodney, FNP  Date: 07/03/2023 3:12 PM  Virtual Visit via Video Note   I, Jannifer Rodney, connected with  Chase Lowe  ([id number], 01/03/1961) on 07/03/23 at  3:15 PM EST by a video-enabled telemedicine application and verified that I am speaking with the correct person using two identifiers.  Location: Patient: Virtual Visit Location  Patient: Home Provider: Virtual Visit Location Provider: Home Office   I discussed the limitations of evaluation and management by telemedicine and the availability of in person appointments. The patient expressed understanding and agreed to proceed.    History of Present Illness: Chase Lowe is a 62 y.o. who identifies as a male who was assigned male at birth, and is being seen today for sinus congestion that started two weeks ago.  HPI: Sinusitis This is a new problem. The current episode started 1 to 4 weeks ago. The problem has been gradually worsening since onset. There has been no fever. His pain is at a severity of 3/10. The pain is mild. Associated symptoms include congestion, ear pain, headaches, sinus pressure and sneezing. Pertinent negatives include no coughing or sore throat. Past treatments include acetaminophen and oral decongestants. The treatment provided mild relief.    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 Swallow whole., Disp: , Rfl:  fluticasone (FLONASE) 50 MCG/ACT nasal spray, Place 2 sprays into both nostrils daily. (Patient not taking: Reported on 03/02/2023), Disp: 16 g, Rfl: 6   lisinopril (ZESTRIL) 5 MG tablet, Take 1 tablet (5 mg total) by mouth daily., Disp: 90 tablet, Rfl: 0   Multiple Vitamins-Minerals (MULTIVITAMIN PO), Take 1 tablet by mouth daily. , Disp: , Rfl:   Observations/Objective: Patient is well-developed,  well-nourished in no acute distress.  Resting comfortably  at home.  Head is normocephalic, atraumatic.  No labored breathing.  Speech is clear and coherent with logical content.  Patient is alert and oriented at baseline.  Sinus congestion   Assessment and Plan: 1. Acute non-recurrent maxillary sinusitis (Primary) - amoxicillin-clavulanate (AUGMENTIN) 875-125 MG tablet; Take 1 tablet by mouth 2 (two) times daily.  Dispense: 14 tablet; Refill: 0  - Take meds as prescribed - Use a cool mist humidifier  -Use saline nose sprays frequently -Force fluids -For any cough or congestion  Use plain Mucinex- regular strength or max strength is fine -For fever or aces or pains- take tylenol or ibuprofen. -Throat lozenges if help -Follow up if symptoms worsen or do not improve   Follow Up Instructions: I discussed the assessment and treatment plan with the patient. The patient was provided an opportunity to ask questions and all were answered. The patient agreed with the plan and demonstrated an understanding of the instructions.  A copy of instructions were sent to the patient via MyChart unless otherwise noted below.     The patient was advised to call back or seek an in-person evaluation if the symptoms worsen or if the condition fails to improve as anticipated.    Jannifer Rodney, FNP

## 2023-07-13 DIAGNOSIS — M1712 Unilateral primary osteoarthritis, left knee: Secondary | ICD-10-CM | POA: Diagnosis not present

## 2023-07-13 DIAGNOSIS — Z96651 Presence of right artificial knee joint: Secondary | ICD-10-CM | POA: Diagnosis not present

## 2023-07-14 NOTE — Progress Notes (Signed)
 OFFICE VISIT  07/15/2023  CC:  Chief Complaint  Patient presents with   Medical Management of Chronic Issues    Pt is not fasting.     Patient is a 63 y.o. male who presents for 6 mo f/u HTN and HLD. At last f/u his bp was well controlled on lisinopril  5mg  qd.   INTERIM HX: LDL was 114 last visit, mildly elevated. He chose to hold off on statin, elected to try TLC first.  Chase Lowe feels well. He has lost 11 pounds since last visit by doing weight watchers diet. He remains very active on his property and through his work.  Home blood pressures consistently less than 120/80  ROS as above, plus--> no fevers, no CP, no SOB, no wheezing, no cough, no dizziness, no HAs, no rashes, no melena/hematochezia.  No polyuria or polydipsia.  No myalgias or arthralgias.  No focal weakness, paresthesias, or tremors.  No acute vision or hearing abnormalities.  No dysuria or unusual/new urinary urgency or frequency.  No recent changes in lower legs. No n/v/d or abd pain.  No palpitations.    Past Medical History:  Diagnosis Date   Complication of anesthesia    during a colonoscopy pt. woke up during the procedure and also during a hand surgery    Duodenal ulcer 1983   GERD (gastroesophageal reflux disease)    Hay fever    History of adenomatous polyp of colon    2016 (Dr. Celestia, [place])   History of blood product transfusion    Hypercholesterolemia    2024, Framingham risk 8%   Hypertension    IFG (impaired fasting glucose)    11/2022 fasting glucose 117.  Hemoglobin A1c 5.1%.   Primary osteoarthritis of knees, bilateral    Shingles    Right lateral hip extending down over right thigh and knee, initial episode 04/2021, recurrence x1 after.  No postherpetic neuralgia.    Past Surgical History:  Procedure Laterality Date   ANTERIOR CERVICAL DECOMP/DISCECTOMY FUSION N/A 06/30/2017   Procedure: ANTERIOR CERVICAL DECOMPRESSION/DISCECTOMY FUSION C5-6;  Surgeon: Burnetta Aures, MD;  Location: [facility]  OR;  Service: Orthopedics;  Laterality: N/A;  3 hrs   COLONOSCOPY  8016,8000   09/02/14 polyps x 3 ([facility])   GANGLION CYST EXCISION Left 2006   HEEL SPUR SURGERY Right 2013   HEEL SPUR SURGERY Right    2nd surgery   INGUINAL HERNIA REPAIR Left 04/26/2014   Procedure: LEFT INGUINAL HERNIA REPAIR WITH MESH;  Surgeon: Vicenta Poli, MD;  Location: [facility] OR;  Service: General;  Laterality: Left;   INGUINAL HERNIA REPAIR Right 02/20/2016   Procedure: RIGHT INGUINAL HERNIA REPAIR WITH MESH;  Surgeon: Vicenta Poli, MD;  Location: [facility] OR;  Service: General;  Laterality: Right;   INSERTION OF MESH Left 04/26/2014   Procedure: INSERTION OF MESH;  Surgeon: Vicenta Poli, MD;  Location: [facility] OR;  Service: General;  Laterality: Left;   INSERTION OF MESH Right 02/20/2016   Procedure: INSERTION OF MESH;  Surgeon: Vicenta Poli, MD;  Location: [facility] OR;  Service: General;  Laterality: Right;   KNEE ARTHROSCOPY Left 02/2015   left   KNEE ARTHROSCOPY Left 12/2015   KNEE ARTHROSCOPY Right    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   TOTAL KNEE REVISION Right 11/18/2022   Procedure: TOTAL KNEE REVISION FEMORAL COMPONENT ONLY;  Surgeon: Ernie Cough, MD;  Location: [facility] ORS;  Service: Orthopedics;  Laterality: Right;   WRIST SURGERY      Outpatient Medications Prior to Visit  Medication Sig Dispense Refill   aspirin  EC 81 MG tablet Take 81 mg by mouth daily. Swallow whole.     fluticasone  (FLONASE ) 50 MCG/ACT nasal spray Place 2 sprays into both nostrils daily. 16 g 6   Multiple Vitamins-Minerals (MULTIVITAMIN PO) Take 1 tablet by mouth daily.      lisinopril  (ZESTRIL ) 5 MG tablet Take 1 tablet (5 mg total) by mouth daily. 90 tablet 0   amoxicillin -clavulanate (AUGMENTIN ) 875-125 MG tablet Take 1 tablet by mouth 2 (two) times daily. 14 tablet 0   No facility-administered medications prior to visit.    Allergies  Allergen Reactions   Aspirin  Other (See Comments)    High doses contraindicated due to  ulcer   Codeine Nausea And Vomiting   Oxycodone -Acetaminophen  Nausea And Vomiting    Review of Systems As per HPI  PE:    07/15/2023    8:07 AM 01/12/2023    8:09 AM 01/12/2023    8:04 AM  Vitals with BMI  Height   5' 11  Weight 204 lbs  202 lbs 3 oz  BMI   28.21  Systolic 124 128 849  Diastolic 81 82 98  Pulse 72  71     Physical Exam  Gen: Alert, well appearing.  Patient is oriented to person, place, time, and situation. AFFECT: pleasant, lucid thought and speech. No further exam today  LABS:  Last CBC Lab Results  Component Value Date   WBC 8.2 01/12/2023   HGB 13.4 01/12/2023   HCT 40.6 01/12/2023   MCV 93.1 01/12/2023   MCH 32.4 11/19/2022   RDW 14.2 01/12/2023   PLT 153.0 01/12/2023   Last metabolic panel Lab Results  Component Value Date   GLUCOSE 112 (H) 01/12/2023   NA 138 01/12/2023   K 4.0 01/12/2023   CL 102 01/12/2023   CO2 26 01/12/2023   BUN 13 01/12/2023   CREATININE 0.98 01/12/2023   GFR 82.41 01/12/2023   CALCIUM 10.0 01/12/2023   PROT 7.7 01/12/2023   ALBUMIN  4.8 01/12/2023   BILITOT 0.6 01/12/2023   ALKPHOS 90 01/12/2023   AST 21 01/12/2023   ALT 20 01/12/2023   ANIONGAP 6 11/19/2022   Last lipids Lab Results  Component Value Date   CHOL 186 01/12/2023   HDL 55.60 01/12/2023   LDLCALC 114 (H) 01/12/2023   TRIG 82.0 01/12/2023   CHOLHDL 3 01/12/2023   Last hemoglobin A1c Lab Results  Component Value Date   HGBA1C 5.1 01/13/2023   IMPRESSION AND PLAN:  #1 hypertension, well-controlled on lisinopril  5 mg a day. Basic metabolic panel today.  2.  Hypercholesterolemia, he prefers to avoid medication if at all possible. LDL was 114 and HDL 56 last visit. Framingham cardiovascular risk is 8%. He chose to hold off on statin. He has lost weight with dieting. Repeat lipid panel today.    He is looking forward to the snow coming later today, has a 57-year-old daughter who is going to go sledding.  An After Visit Summary  was printed and given to the patient.  FOLLOW UP: Return in about 6 months (around 01/12/2024) for annual CPE (fasting).  Signed:  Gerlene Hockey, MD           07/15/2023

## 2023-07-15 ENCOUNTER — Other Ambulatory Visit: Payer: Self-pay

## 2023-07-15 ENCOUNTER — Other Ambulatory Visit (HOSPITAL_COMMUNITY): Payer: Self-pay

## 2023-07-15 ENCOUNTER — Encounter: Payer: Self-pay | Admitting: Family Medicine

## 2023-07-15 ENCOUNTER — Ambulatory Visit: Payer: Commercial Managed Care - PPO | Admitting: Family Medicine

## 2023-07-15 VITALS — BP 124/81 | HR 72 | Wt 204.0 lb

## 2023-07-15 DIAGNOSIS — E7800 Pure hypercholesterolemia, unspecified: Secondary | ICD-10-CM | POA: Diagnosis not present

## 2023-07-15 DIAGNOSIS — I10 Essential (primary) hypertension: Secondary | ICD-10-CM

## 2023-07-15 LAB — BASIC METABOLIC PANEL
BUN: 19 mg/dL (ref 6–23)
CO2: 27 meq/L (ref 19–32)
Calcium: 9.5 mg/dL (ref 8.4–10.5)
Chloride: 103 meq/L (ref 96–112)
Creatinine, Ser: 1.14 mg/dL (ref 0.40–1.50)
GFR: 68.49 mL/min (ref >60.00)
Glucose, Bld: 91 mg/dL (ref 70–99)
Potassium: 4 meq/L (ref 3.5–5.1)
Sodium: 138 meq/L (ref 135–145)

## 2023-07-15 LAB — LIPID PANEL
Cholesterol: 199 mg/dL (ref 0–200)
HDL: 44.6 mg/dL (ref >39.00)
LDL Cholesterol: 141 mg/dL — ABNORMAL HIGH (ref 0–99)
NonHDL: 154.54
Total CHOL/HDL Ratio: 4
Triglycerides: 68 mg/dL (ref 0.0–149.0)
VLDL: 13.6 mg/dL (ref 0.0–40.0)

## 2023-07-15 MED ORDER — LISINOPRIL 5 MG PO TABS
5.0000 mg | ORAL_TABLET | Freq: Every day | ORAL | 3 refills | Status: DC
  Filled 2023-07-15: qty 90, 90d supply, fill #0

## 2023-07-18 NOTE — Addendum Note (Signed)
 Addended by: Daron Offer A on: 07/18/2023 09:52 AM   Modules accepted: Orders

## 2023-07-25 ENCOUNTER — Telehealth: Payer: Self-pay

## 2023-07-25 NOTE — Telephone Encounter (Signed)
I recommend he stop taking the blood pressure medication completely.  Continue to monitor blood pressure at home.

## 2023-07-25 NOTE — Telephone Encounter (Signed)
Advised pt to schedule appt and to bring machine to compared with office BP. Pt states he has been having low BP and is only taking 2.5 mg of lisinopril. Pt does not want to pay another copay to discuss to this time. Please advise

## 2023-07-25 NOTE — Telephone Encounter (Signed)
Copied from CRM [id number]. Topic: Clinical - Medical Advice >> Jul 25, 2023  2:18 PM Kathryne Eriksson wrote: Reason for CRM: lisinopril (ZESTRIL) 5 MG tablet >> Jul 25, 2023  2:20 PM Kathryne Eriksson wrote: Patient states he's still having some issues and concerns with his blood pressure still being pretty low while taking the medication. Patient is requesting a call back for some advice, call back number [phone]

## 2023-07-26 NOTE — Telephone Encounter (Signed)
 Pt given recommendations

## 2023-08-04 ENCOUNTER — Telehealth: Payer: Self-pay

## 2023-08-04 NOTE — Telephone Encounter (Signed)
Surgical clearance forms received on 07/28/23. Patient has been scheduled on 09/07/23 to surgical clearance appt. Forms have been placed in CMA basket (to be done).

## 2023-08-09 NOTE — Telephone Encounter (Signed)
Yes holding for surgical clearance appt on 09/07/23

## 2023-08-09 NOTE — Telephone Encounter (Signed)
I have not received this form.  Do you still have it?

## 2023-08-10 NOTE — Telephone Encounter (Signed)
 Okay

## 2023-09-06 ENCOUNTER — Telehealth: Admitting: Physician Assistant

## 2023-09-06 DIAGNOSIS — B029 Zoster without complications: Secondary | ICD-10-CM

## 2023-09-06 MED ORDER — VALACYCLOVIR HCL 1 G PO TABS
1000.0000 mg | ORAL_TABLET | Freq: Three times a day (TID) | ORAL | 0 refills | Status: DC
Start: 2023-09-06 — End: 2023-09-13

## 2023-09-06 NOTE — Progress Notes (Signed)
 I have spent 5 minutes in review of e-visit questionnaire, review and updating patient chart, medical decision making and response to patient.   Piedad Climes, PA-C

## 2023-09-06 NOTE — Progress Notes (Signed)
E-visit for Shingles   We are sorry that you are not feeling well. Here is how we plan to help!  Based on what you shared with me it looks like you have shingles.  Shingles or herpes zoster, is a common infection of the nerves.  It is a painful rash caused by the herpes zoster virus.  This is the same virus that causes chickenpox.  After a person has chickenpox, the virus remains inactive in the nerve cells.  Years later, the virus can become active again and travel to the skin.  It typically will appear on one side of the face or body.  Burning or shooting pain, tingling, or itching are early signs of the infection.  Blisters typically scab over in 7 to 10 days and clear up within 2-4 weeks. Shingles is only contagious to people that have never had the chickenpox, the chickenpox vaccine, or anyone who has a compromised immune system.  You should avoid contact with these type of people until your blisters scab over.  I have prescribed Valacyclovir 1g three times daily for 7 days   HOME CARE: Apply ice packs (wrapped in a thin towel), cool compresses, or soak in cool bath to help reduce pain. Use calamine lotion to calm itchy skin. Avoid scratching the rash. Avoid direct sunlight.  GET HELP RIGHT AWAY IF: Symptoms that don't away after treatment. A rash or blisters near your eye. Increased drainage, fever, or rash after treatment. Severe pain that doesn't go away.   MAKE SURE YOU   Understand these instructions. Will watch your condition. Will get help right away if you are not doing well or get worse.  Thank you for choosing an e-visit.  Your e-visit answers were reviewed by a board certified advanced clinical practitioner to complete your personal care plan. Depending upon the condition, your plan could have included both over the counter or prescription medications.  Please review your pharmacy choice. Make sure the pharmacy is open so you can pick up prescription now. If there is  a problem, you may contact your provider through MyChart messaging and have the prescription routed to another pharmacy.  Your safety is important to us. If you have drug allergies check your prescription carefully.   For the next 24 hours you can use MyChart to ask questions about today's visit, request a non-urgent call back, or ask for a work or school excuse. You will get an email in the next two days asking about your experience. I hope that your e-visit has been valuable and will speed your recovery.  

## 2023-09-07 ENCOUNTER — Other Ambulatory Visit: Payer: Commercial Managed Care - PPO

## 2023-09-07 ENCOUNTER — Encounter: Payer: Self-pay | Admitting: Family Medicine

## 2023-09-07 ENCOUNTER — Ambulatory Visit (INDEPENDENT_AMBULATORY_CARE_PROVIDER_SITE_OTHER): Payer: Commercial Managed Care - PPO | Admitting: Family Medicine

## 2023-09-07 ENCOUNTER — Other Ambulatory Visit (HOSPITAL_COMMUNITY): Payer: Self-pay

## 2023-09-07 VITALS — BP 138/81 | HR 68 | Ht 71.0 in | Wt 182.2 lb

## 2023-09-07 DIAGNOSIS — R011 Cardiac murmur, unspecified: Secondary | ICD-10-CM

## 2023-09-07 DIAGNOSIS — Z01818 Encounter for other preprocedural examination: Secondary | ICD-10-CM

## 2023-09-07 DIAGNOSIS — E7800 Pure hypercholesterolemia, unspecified: Secondary | ICD-10-CM | POA: Diagnosis not present

## 2023-09-07 DIAGNOSIS — I10 Essential (primary) hypertension: Secondary | ICD-10-CM

## 2023-09-07 DIAGNOSIS — B029 Zoster without complications: Secondary | ICD-10-CM | POA: Diagnosis not present

## 2023-09-07 LAB — LIPID PANEL
Cholesterol: 145 mg/dL (ref 0–200)
HDL: 43.9 mg/dL (ref >39.00)
LDL Cholesterol: 83 mg/dL (ref 0–99)
NonHDL: 101.07
Total CHOL/HDL Ratio: 3
Triglycerides: 91 mg/dL (ref 0.0–149.0)
VLDL: 18.2 mg/dL (ref 0.0–40.0)

## 2023-09-07 LAB — COMPREHENSIVE METABOLIC PANEL
ALT: 16 U/L (ref 0–53)
AST: 20 U/L (ref 0–37)
Albumin: 4.9 g/dL (ref 3.5–5.2)
Alkaline Phosphatase: 76 U/L (ref 39–117)
BUN: 14 mg/dL (ref 6–23)
CO2: 30 meq/L (ref 19–32)
Calcium: 10 mg/dL (ref 8.4–10.5)
Chloride: 103 meq/L (ref 96–112)
Creatinine, Ser: 0.98 mg/dL (ref 0.40–1.50)
GFR: 82.04 mL/min (ref >60.00)
Glucose, Bld: 93 mg/dL (ref 70–99)
Potassium: 4.5 meq/L (ref 3.5–5.1)
Sodium: 139 meq/L (ref 135–145)
Total Bilirubin: 0.7 mg/dL (ref 0.2–1.2)
Total Protein: 7.5 g/dL (ref 6.0–8.3)

## 2023-09-07 LAB — CBC WITH DIFFERENTIAL/PLATELET
Basophils Absolute: 0 10*3/uL (ref 0.0–0.1)
Basophils Relative: 0.5 % (ref 0.0–3.0)
Eosinophils Absolute: 0.2 10*3/uL (ref 0.0–0.7)
Eosinophils Relative: 3.5 % (ref 0.0–5.0)
HCT: 42.2 % (ref 39.0–52.0)
Hemoglobin: 14.3 g/dL (ref 13.0–17.0)
Lymphocytes Relative: 53.4 % — ABNORMAL HIGH (ref 12.0–46.0)
Lymphs Abs: 2.5 10*3/uL (ref 0.7–4.0)
MCHC: 33.8 g/dL (ref 30.0–36.0)
MCV: 94.3 fl (ref 78.0–100.0)
Monocytes Absolute: 0.3 10*3/uL (ref 0.1–1.0)
Monocytes Relative: 7.4 % (ref 3.0–12.0)
Neutro Abs: 1.6 10*3/uL (ref 1.4–7.7)
Neutrophils Relative %: 35.2 % — ABNORMAL LOW (ref 43.0–77.0)
Platelets: 115 10*3/uL — ABNORMAL LOW (ref 150.0–400.0)
RBC: 4.48 Mil/uL (ref 4.22–5.81)
RDW: 13.3 % (ref 11.5–15.5)
WBC: 4.7 10*3/uL (ref 4.0–10.5)

## 2023-09-07 MED ORDER — VALACYCLOVIR HCL 1 G PO TABS
1000.0000 mg | ORAL_TABLET | Freq: Two times a day (BID) | ORAL | 0 refills | Status: DC
Start: 2023-09-07 — End: 2024-01-31
  Filled 2023-09-07: qty 28, 14d supply, fill #0

## 2023-09-07 NOTE — Progress Notes (Signed)
 Office Note 09/07/2023  CC:  Chief Complaint  Patient presents with   Surgical Clearance    HPI:  Patient is a 63 y.o. male who is here for preoperative medical clearance for upcoming left total knee arthroplasty.   He underwent revision of right knee arthroplasty in May 2024 and had no problems. He denies shortness of breath, chest pain, dizziness, palpitations, or lower extremity swelling. He is able to do strenuous yard work without any problems.  Home blood pressures consistently around 110/70.  He is not taking his antihypertensive at all lately. He has purposefully lost about 20 pounds over the last couple months.  Excellent dietary changes.   Past Medical History:  Diagnosis Date   Complication of anesthesia    during a colonoscopy pt. woke up during the procedure and also during a hand surgery    Duodenal ulcer 1983   GERD (gastroesophageal reflux disease)    Hay fever    History of adenomatous polyp of colon    2016 (Dr. Randa Evens, [facility])   History of blood product transfusion    Hypercholesterolemia    2024, Framingham risk 8%   Hypertension    IFG (impaired fasting glucose)    11/2022 fasting glucose 117.  Hemoglobin A1c 5.1%.   Primary osteoarthritis of knees, bilateral    Shingles    Right lateral hip extending down over right thigh and knee, initial episode 04/2021, recurrence x1 after.  No postherpetic neuralgia.    Past Surgical History:  Procedure Laterality Date   ANTERIOR CERVICAL DECOMP/DISCECTOMY FUSION N/A 06/30/2017   Procedure: ANTERIOR CERVICAL DECOMPRESSION/DISCECTOMY FUSION C5-6;  Surgeon: Venita Lick, MD;  Location: [facility] OR;  Service: Orthopedics;  Laterality: N/A;  3 hrs   COLONOSCOPY  [id number]   09/02/14 polyps x 3 ([facility])   GANGLION CYST EXCISION Left 2006   HEEL SPUR SURGERY Right 2013   HEEL SPUR SURGERY Right    2nd surgery   INGUINAL HERNIA REPAIR Left 04/26/2014   Procedure: LEFT INGUINAL HERNIA REPAIR WITH MESH;  Surgeon:  Abigail Miyamoto, MD;  Location: [facility] OR;  Service: General;  Laterality: Left;   INGUINAL HERNIA REPAIR Right 02/20/2016   Procedure: RIGHT INGUINAL HERNIA REPAIR WITH MESH;  Surgeon: Abigail Miyamoto, MD;  Location: [facility] OR;  Service: General;  Laterality: Right;   INSERTION OF MESH Left 04/26/2014   Procedure: INSERTION OF MESH;  Surgeon: Abigail Miyamoto, MD;  Location: [facility] OR;  Service: General;  Laterality: Left;   INSERTION OF MESH Right 02/20/2016   Procedure: INSERTION OF MESH;  Surgeon: Abigail Miyamoto, MD;  Location: [facility] OR;  Service: General;  Laterality: Right;   KNEE ARTHROSCOPY Left 02/2015   left   KNEE ARTHROSCOPY Left 12/2015   KNEE ARTHROSCOPY Right    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   TOTAL KNEE REVISION Right 11/18/2022   Procedure: TOTAL KNEE REVISION FEMORAL COMPONENT ONLY;  Surgeon: Durene Romans, MD;  Location: [facility] ORS;  Service: Orthopedics;  Laterality: Right;   WRIST SURGERY      Family History  Problem Relation Age of Onset   Scoliosis Mother    Healthy Brother     Social History   Socioeconomic History   Marital status: Married    Spouse name: Not on file   Number of children: Not on file   Years of education: Not on file   Highest education level: 12th grade  Occupational History   Not on file  Tobacco Use   Smoking status: Former  Current packs/day: 0.00    Types: Cigarettes    Start date: 84    Quit date: 1989    Years since quitting: 36.1    Passive exposure: Never   Smokeless tobacco: Never   Tobacco comments:    quit 1989  Vaping Use   Vaping status: Never Used  Substance and Sexual Activity   Alcohol use: Yes    Alcohol/week: 6.0 - 12.0 standard drinks of alcohol    Types: 6 - 12 Cans of beer per week    Comment: occasional   Drug use: No   Sexual activity: Not on file  Other Topics Concern   Not on file  Social History Narrative   Married,   Educ: HS   Occup: sales   Social Drivers of Corporate investment banker  Strain: Low Risk  (07/11/2023)   Overall Financial Resource Strain (CARDIA)    Difficulty of Paying Living Expenses: Not hard at all  Food Insecurity: No Food Insecurity (07/11/2023)   Hunger Vital Sign    Worried About Running Out of Food in the Last Year: Never true    Ran Out of Food in the Last Year: Never true  Transportation Needs: No Transportation Needs (07/11/2023)   PRAPARE - Administrator, Civil Service (Medical): No    Lack of Transportation (Non-Medical): No  Physical Activity: Sufficiently Active (07/11/2023)   Exercise Vital Sign    Days of Exercise per Week: 5 days    Minutes of Exercise per Session: 30 min  Stress: No Stress Concern Present (07/11/2023)   [employer] of Occupational Health - Occupational Stress Questionnaire    Feeling of Stress : Not at all  Social Connections: Moderately Integrated (07/11/2023)   Social Connection and Isolation Panel [NHANES]    Frequency of Communication with Friends and Family: More than three times a week    Frequency of Social Gatherings with Friends and Family: Once a week    Attends Religious Services: 1 to 4 times per year    Active Member of [employer] or Organizations: No    Attends Engineer, structural: Not on file    Marital Status: Married  Catering manager Violence: Not At Risk (11/18/2022)   Humiliation, Afraid, Rape, and Kick questionnaire    Fear of Current or Ex-Partner: No    Emotionally Abused: No    Physically Abused: No    Sexually Abused: No    Outpatient Medications Prior to Visit  Medication Sig Dispense Refill   aspirin EC 81 MG tablet Take 81 mg by mouth daily. Swallow whole.     fluticasone (FLONASE) 50 MCG/ACT nasal spray Place 2 sprays into both nostrils daily. 16 g 6   Multiple Vitamins-Minerals (MULTIVITAMIN PO) Take 1 tablet by mouth daily.      valACYclovir (VALTREX) 1000 MG tablet Take 1 tablet (1,000 mg total) by mouth 3 (three) times daily for 7 days. 21 tablet 0   lisinopril  (ZESTRIL) 5 MG tablet Take 1 tablet (5 mg total) by mouth daily. 90 tablet 3   No facility-administered medications prior to visit.    Allergies  Allergen Reactions   Aspirin Other (See Comments)    High doses contraindicated due to ulcer   Codeine Nausea And Vomiting   Oxycodone-Acetaminophen Nausea And Vomiting    Review of Systems ROS as above, plus--> no fevers, no CP, no SOB, no wheezing, no cough, no dizziness, no HAs, no rashes, no melena/hematochezia.  No polyuria or  polydipsia.  No myalgias or arthralgias.  No focal weakness, paresthesias, or tremors.  No acute vision or hearing abnormalities.  No dysuria or unusual/new urinary urgency or frequency.  No recent changes in lower legs. No n/v/d or abd pain.  No palpitations.    PE;    09/07/2023    8:02 AM 07/15/2023    8:07 AM 01/12/2023    8:09 AM  Vitals with BMI  Height 5\' 11"     Weight 182 lbs 3 oz 204 lbs   BMI 25.42    Systolic 138 124 829  Diastolic 81 81 82  Pulse 68 72      Gen: Alert, well appearing.  Patient is oriented to person, place, time, and situation. AFFECT: pleasant, lucid thought and speech. ENT: Ears: Eyes: no injection, icteris, swelling, or exudate.  EOMI, PERRLA. Nose: no drainage or turbinate edema/swelling.  No injection or focal lesion.  Mouth: lips without lesion/swelling.  Oral mucosa pink and moist.  Dentition intact and without obvious caries or gingival swelling.  Oropharynx without erythema, exudate, or swelling.  Neck: supple/nontender.  No LAD, mass, or TM.  Carotid pulses 2+ bilaterally, without bruits. CV: RRR, 2 out of 6 systolic murmur heard best at the right upper sternal border and radiating up into the infraclavicular region.  S1 and S2 are clear.  No r/g.   LUNGS: CTA bilat, nonlabored resps, good aeration in all lung fields. ABD: soft, NT, ND, BS normal.  No hepatospenomegaly or mass.  No bruits. EXT: no clubbing, cyanosis, or edema.   Pertinent labs:   Lab Results   Component Value Date   WBC 8.2 01/12/2023   HGB 13.4 01/12/2023   HCT 40.6 01/12/2023   MCV 93.1 01/12/2023   PLT 153.0 01/12/2023   Lab Results  Component Value Date   CREATININE 1.14 07/15/2023   BUN 19 07/15/2023   NA 138 07/15/2023   K 4.0 07/15/2023   CL 103 07/15/2023   CO2 27 07/15/2023   Lab Results  Component Value Date   ALT 20 01/12/2023   AST 21 01/12/2023   ALKPHOS 90 01/12/2023   BILITOT 0.6 01/12/2023   Lab Results  Component Value Date   CHOL 199 07/15/2023   Lab Results  Component Value Date   HDL 44.60 07/15/2023   Lab Results  Component Value Date   LDLCALC 141 (H) 07/15/2023   Lab Results  Component Value Date   TRIG 68.0 07/15/2023   Lab Results  Component Value Date   CHOLHDL 4 07/15/2023   Lab Results  Component Value Date   PSA 0.86 01/12/2023   PSA 0.60 12/25/2021   Lab Results  Component Value Date   HGBA1C 5.1 01/13/2023   ASSESSMENT AND PLAN:   #1 preoperative clearance for upcoming left total knee arthroplasty. I am going to clear him as a low risk.  He is asymptomatic and without any significant past cardiovascular or pulmonary problems.  He has undergone general anesthesia multiple times in the past. CBC, c-Met, and lipid panel today.  #2 systolic murmur. Asymptomatic. He does have history of LVH on EKG in May 2024. Checking echocardiogram.--- Ordered today.  #3 hypercholesterolemia, has done great with diet changes and weight loss since his last lipid panel 2 months ago.  Recheck lipids today.  He definitely prefers nonpharmacologic approach.  #4 hypertension, doing great off of medication.  An After Visit Summary was printed and given to the patient.  FOLLOW UP:  Return for keep appt  already scheduled for JULY.  Signed:  Santiago Bumpers, MD           09/07/2023

## 2023-09-08 NOTE — Telephone Encounter (Signed)
 FYI  Please see below

## 2023-09-16 ENCOUNTER — Encounter: Payer: Self-pay | Admitting: Pharmacist

## 2023-09-16 ENCOUNTER — Other Ambulatory Visit (HOSPITAL_COMMUNITY): Payer: Self-pay

## 2023-09-16 ENCOUNTER — Other Ambulatory Visit: Payer: Self-pay

## 2023-09-16 DIAGNOSIS — M1712 Unilateral primary osteoarthritis, left knee: Secondary | ICD-10-CM | POA: Diagnosis not present

## 2023-09-16 DIAGNOSIS — M25562 Pain in left knee: Secondary | ICD-10-CM | POA: Diagnosis not present

## 2023-09-16 MED ORDER — POLYETHYLENE GLYCOL 3350 17 G PO PACK
17.0000 g | PACK | Freq: Two times a day (BID) | ORAL | 1 refills | Status: DC
  Filled 2023-09-16 (×2): qty 28, 14d supply, fill #0

## 2023-09-16 MED ORDER — HYDROMORPHONE HCL 2 MG PO TABS
2.0000 mg | ORAL_TABLET | ORAL | 0 refills | Status: DC | PRN
  Filled 2023-09-16 (×2): qty 42, 7d supply, fill #0

## 2023-09-16 MED ORDER — ASPIRIN 81 MG PO CHEW
81.0000 mg | CHEWABLE_TABLET | Freq: Two times a day (BID) | ORAL | 0 refills | Status: DC
  Filled 2023-09-16 (×2): qty 60, 30d supply, fill #0

## 2023-09-16 MED ORDER — ONDANSETRON 4 MG PO TBDP
4.0000 mg | ORAL_TABLET | Freq: Four times a day (QID) | ORAL | 0 refills | Status: DC | PRN
  Filled 2023-09-16 (×2): qty 10, 3d supply, fill #0

## 2023-09-16 MED ORDER — SENNOSIDES 8.6 MG PO TABS
2.0000 | ORAL_TABLET | Freq: Every day | ORAL | 0 refills | Status: DC
  Filled 2023-09-16 (×2): qty 28, 14d supply, fill #0

## 2023-09-16 MED ORDER — METHOCARBAMOL 500 MG PO TABS
500.0000 mg | ORAL_TABLET | Freq: Four times a day (QID) | ORAL | 2 refills | Status: DC | PRN
  Filled 2023-09-16 (×2): qty 40, 10d supply, fill #0

## 2023-09-20 ENCOUNTER — Other Ambulatory Visit (HOSPITAL_COMMUNITY): Payer: Self-pay

## 2023-09-26 ENCOUNTER — Other Ambulatory Visit (HOSPITAL_COMMUNITY): Payer: Self-pay

## 2023-09-27 ENCOUNTER — Other Ambulatory Visit (HOSPITAL_COMMUNITY): Payer: Self-pay

## 2023-09-28 ENCOUNTER — Other Ambulatory Visit (HOSPITAL_COMMUNITY): Payer: Self-pay

## 2023-10-03 DIAGNOSIS — M1712 Unilateral primary osteoarthritis, left knee: Secondary | ICD-10-CM | POA: Diagnosis not present

## 2023-10-03 HISTORY — PX: KNEE ARTHROPLASTY: SHX992

## 2023-10-07 DIAGNOSIS — M6281 Muscle weakness (generalized): Secondary | ICD-10-CM | POA: Diagnosis not present

## 2023-10-07 DIAGNOSIS — M25562 Pain in left knee: Secondary | ICD-10-CM | POA: Diagnosis not present

## 2023-10-07 DIAGNOSIS — M25662 Stiffness of left knee, not elsewhere classified: Secondary | ICD-10-CM | POA: Diagnosis not present

## 2023-10-10 DIAGNOSIS — M25562 Pain in left knee: Secondary | ICD-10-CM | POA: Diagnosis not present

## 2023-10-10 DIAGNOSIS — M6281 Muscle weakness (generalized): Secondary | ICD-10-CM | POA: Diagnosis not present

## 2023-10-10 DIAGNOSIS — M25662 Stiffness of left knee, not elsewhere classified: Secondary | ICD-10-CM | POA: Diagnosis not present

## 2023-10-12 DIAGNOSIS — M6281 Muscle weakness (generalized): Secondary | ICD-10-CM | POA: Diagnosis not present

## 2023-10-12 DIAGNOSIS — M25562 Pain in left knee: Secondary | ICD-10-CM | POA: Diagnosis not present

## 2023-10-12 DIAGNOSIS — M25662 Stiffness of left knee, not elsewhere classified: Secondary | ICD-10-CM | POA: Diagnosis not present

## 2023-10-14 DIAGNOSIS — M25662 Stiffness of left knee, not elsewhere classified: Secondary | ICD-10-CM | POA: Diagnosis not present

## 2023-10-14 DIAGNOSIS — M25562 Pain in left knee: Secondary | ICD-10-CM | POA: Diagnosis not present

## 2023-10-14 DIAGNOSIS — M6281 Muscle weakness (generalized): Secondary | ICD-10-CM | POA: Diagnosis not present

## 2023-10-19 ENCOUNTER — Other Ambulatory Visit (HOSPITAL_COMMUNITY): Payer: Self-pay

## 2023-10-19 MED ORDER — HYDROXYZINE HCL 25 MG PO TABS
25.0000 mg | ORAL_TABLET | Freq: Every evening | ORAL | 0 refills | Status: DC | PRN
  Filled 2023-10-19: qty 30, 30d supply, fill #0

## 2023-10-20 ENCOUNTER — Other Ambulatory Visit: Payer: Self-pay

## 2023-10-23 ENCOUNTER — Other Ambulatory Visit: Payer: Self-pay | Admitting: Family Medicine

## 2023-10-24 ENCOUNTER — Other Ambulatory Visit (HOSPITAL_COMMUNITY): Payer: Self-pay

## 2023-10-24 MED ORDER — ASPIRIN 81 MG PO TBEC
81.0000 mg | DELAYED_RELEASE_TABLET | Freq: Every day | ORAL | 0 refills | Status: DC
  Filled 2023-10-24: qty 30, 30d supply, fill #0

## 2023-10-25 ENCOUNTER — Other Ambulatory Visit (HOSPITAL_COMMUNITY): Payer: Self-pay

## 2023-10-25 ENCOUNTER — Other Ambulatory Visit: Payer: Self-pay

## 2023-11-02 ENCOUNTER — Other Ambulatory Visit: Payer: Self-pay

## 2023-11-02 ENCOUNTER — Other Ambulatory Visit (HOSPITAL_COMMUNITY): Payer: Self-pay

## 2023-11-02 MED ORDER — PREDNISONE 5 MG (21) PO TBPK
ORAL_TABLET | ORAL | 0 refills | Status: DC
  Filled 2023-11-02 (×2): qty 21, 6d supply, fill #0

## 2023-11-02 MED ORDER — HYDROMORPHONE HCL 2 MG PO TABS
2.0000 mg | ORAL_TABLET | Freq: Two times a day (BID) | ORAL | 0 refills | Status: DC | PRN
  Filled 2023-11-02 (×2): qty 10, 5d supply, fill #0

## 2023-11-04 ENCOUNTER — Ambulatory Visit (HOSPITAL_COMMUNITY)
Admission: RE | Admit: 2023-11-04 | Discharge: 2023-11-04 | Disposition: A | Discharge status: Home / Self Care | Source: Ambulatory Visit | Attending: Family Medicine | Admitting: Family Medicine

## 2023-11-04 DIAGNOSIS — R011 Cardiac murmur, unspecified: Secondary | ICD-10-CM

## 2023-11-07 ENCOUNTER — Telehealth: Payer: Self-pay

## 2023-11-07 LAB — ECHOCARDIOGRAM COMPLETE
AR max vel: 0.69 cm2
AV Area VTI: 0.71 cm2
AV Area mean vel: 0.71 cm2
AV Mean grad: 25 mmHg
AV Peak grad: 45.5 mmHg
Ao pk vel: 3.37 m/s
Area-P 1/2: 3.99 cm2
MV M vel: 3.39 m/s
MV Peak grad: 46 mmHg
S' Lateral: 2.81 cm

## 2023-11-07 NOTE — Telephone Encounter (Signed)
 Copied from CRM [id number]. Topic: Clinical - Lab/Test Results >> Nov 07, 2023  1:25 PM Chase Lowe wrote: Reason for CRM: Patient called wanting to go over his results, patient states that he just got back his cardio results and he is extremely concerned. He states he would like a call from Dr. Johnette Naegeli to go over the results in details as well as make a medical plan on the next steps. Patient can be reached at the number on file.   Please advise on results.

## 2023-11-08 ENCOUNTER — Other Ambulatory Visit: Payer: Self-pay | Admitting: Family Medicine

## 2023-11-08 DIAGNOSIS — I7121 Aneurysm of the ascending aorta, without rupture: Secondary | ICD-10-CM

## 2023-11-08 DIAGNOSIS — I350 Nonrheumatic aortic (valve) stenosis: Secondary | ICD-10-CM

## 2023-11-08 NOTE — Telephone Encounter (Signed)
 I called patient. See my result note attached to echo report for details.

## 2023-11-10 ENCOUNTER — Encounter: Payer: Self-pay | Admitting: Family Medicine

## 2023-11-10 ENCOUNTER — Telehealth: Payer: Self-pay

## 2023-11-10 DIAGNOSIS — I7121 Aneurysm of the ascending aorta, without rupture: Secondary | ICD-10-CM

## 2023-11-10 NOTE — Telephone Encounter (Signed)
 Pt confirmed preferred imaging location. Can you confirm if auth needed prior to scheduling?

## 2023-11-10 NOTE — Telephone Encounter (Signed)
 Copied from CRM [id number]. Topic: Clinical - Medical Advice >> Nov 10, 2023 10:22 AM Albertha Alosa wrote: Reason for CRM: Patient called in regarding referral to the cardiologist, is waiting to see what is the next steps and what he needs to do, would like for a Dr.Mcgowen or a nurse to give him a callback about this

## 2023-11-10 NOTE — Addendum Note (Signed)
 Addended by: Shelvia Dick on: 11/10/2023 12:47 PM   Modules accepted: Orders

## 2023-11-10 NOTE — Telephone Encounter (Signed)
 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

## 2023-11-10 NOTE — Telephone Encounter (Signed)
 I have pended the CT. Please see which imaging location patient prefers then go ahead and complete the order. Thanks

## 2023-11-10 NOTE — Addendum Note (Signed)
 Addended by: Terris Fickle D on: 11/10/2023 01:18 PM   Modules accepted: Orders

## 2023-11-21 ENCOUNTER — Ambulatory Visit (HOSPITAL_BASED_OUTPATIENT_CLINIC_OR_DEPARTMENT_OTHER)
Admission: RE | Admit: 2023-11-21 | Discharge: 2023-11-21 | Disposition: A | Discharge status: Home / Self Care | Source: Ambulatory Visit | Attending: Family Medicine | Admitting: Family Medicine

## 2023-11-21 DIAGNOSIS — I7121 Aneurysm of the ascending aorta, without rupture: Secondary | ICD-10-CM | POA: Diagnosis not present

## 2023-11-21 LAB — POCT I-STAT CREATININE: Creatinine, Ser: 1 mg/dL (ref 0.61–1.24)

## 2023-11-21 MED ORDER — IOHEXOL 350 MG/ML SOLN
100.0000 mL | Freq: Once | INTRAVENOUS | Status: AC | PRN
  Administered 2023-11-21: 80 mL via INTRAVENOUS

## 2023-11-22 ENCOUNTER — Ambulatory Visit: Payer: Self-pay | Admitting: Family Medicine

## 2023-12-01 ENCOUNTER — Ambulatory Visit: Attending: Surgery | Admitting: Surgery

## 2023-12-01 ENCOUNTER — Encounter: Payer: Self-pay | Admitting: Surgery

## 2023-12-01 VITALS — BP 147/87 | HR 81 | Resp 20 | Ht 71.0 in | Wt 182.8 lb

## 2023-12-01 DIAGNOSIS — I7121 Aneurysm of the ascending aorta, without rupture: Secondary | ICD-10-CM | POA: Diagnosis not present

## 2023-12-01 DIAGNOSIS — I712 Thoracic aortic aneurysm, without rupture, unspecified: Secondary | ICD-10-CM | POA: Insufficient documentation

## 2023-12-02 ENCOUNTER — Other Ambulatory Visit: Payer: Self-pay

## 2023-12-02 ENCOUNTER — Other Ambulatory Visit: Payer: Self-pay | Admitting: *Deleted

## 2023-12-02 ENCOUNTER — Encounter: Payer: Self-pay | Admitting: *Deleted

## 2023-12-02 DIAGNOSIS — I7121 Aneurysm of the ascending aorta, without rupture: Secondary | ICD-10-CM

## 2023-12-02 DIAGNOSIS — Z01810 Encounter for preprocedural cardiovascular examination: Secondary | ICD-10-CM

## 2023-12-02 NOTE — Telephone Encounter (Signed)
 Below message sent via secure chat from [facility]: Hey! This guy saw bartle yesterday with TAA/AS and he needs Rt/Lt heart cath. can you set that up with Almedia Arista or AT? RB Mid-July please. His srgy is 8/15 and needs mid-July due to his vacation schedule   Called and spoke with patient. Scheduled to be seen in office by Thukkani on 12/26/23 at 3pm-will update labs and EKG. Cath set for 12/28/23 @10 :30 with Thukkani. Patient aware and cath instructions sent via MyChart.

## 2023-12-04 ENCOUNTER — Encounter: Payer: Self-pay | Admitting: Surgery

## 2023-12-04 NOTE — Progress Notes (Signed)
 [address] [place] [address]             [phone]     Cardiothoracic Surgery Consultation  PCP is McGowen, Minetta Aly, MD Referring Provider is Shelvia Dick, MD  Chief Complaint  Patient presents with   Thoracic Aortic Aneurysm    New patient consultation, CTA chest 5/19, ECHO 5/2    HPI:  The patient is a 63 year old gentleman with a history of hypertension, hyperlipidemia, and 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  He was noted to have a heart murmur and LVH on his preoperative ECG and an echocardiogram on 11/04/2023 showed a moderately calcified aortic valve with an indeterminate number of leaflets.  There is felt to be moderate aortic stenosis with a mean gradient of 25 mmHg and a peak gradient of 45.5 mmHg.  Aortic valve area by VTI was 0.71 cm.  Dimensionless index was 0.29.  Stroke-volume index was low at 26.  Left ventricular ejection fraction was 55 to 60% with mild LVH.  The aorta was noted to be dilated to 47 mm.  CT of the chest on 11/21/2023 showed fusiform aneurysmal enlargement of the ascending aorta to 5.1 cm.  He is here today with his wife.  He remains active without any symptoms.  He denies any shortness of breath or fatigue.  He has had no dizziness or syncope.  He denies orthopnea and peripheral edema.  He has had no chest  discomfort.  His father also had aortic valve disease and required replacement at a younger age.  He does not know if he had a bicuspid aortic valve disease or an aneurysm.  Past Medical History:  Diagnosis Date   Aortic stenosis    mod/sev on echo 11/2023   Complication of anesthesia    during a colonoscopy pt. woke up during the procedure and also during a hand surgery    Duodenal ulcer 1983   GERD (gastroesophageal reflux disease)    Hay fever    History of adenomatous polyp of  colon    2016 (Dr. Denece Finger, [facility])   History of blood product transfusion    Hypercholesterolemia    2024, Framingham risk 8%   Hypertension    IFG (impaired fasting glucose)    11/2022 fasting glucose 117.  Hemoglobin A1c 5.1%.   Primary osteoarthritis of knees, bilateral    Shingles    Right lateral hip extending down over right thigh and knee, initial episode 04/2021, recurrence x1 after.  No postherpetic neuralgia.   Thoracic aortic aneurysm (HCC)    4.7cm may 2025    Past Surgical History:  Procedure Laterality Date   ANTERIOR CERVICAL DECOMP/DISCECTOMY FUSION N/A 06/30/2017   Procedure: ANTERIOR CERVICAL DECOMPRESSION/DISCECTOMY FUSION C5-6;  Surgeon: Mort Ards, MD;  Location: [facility] OR;  Service: Orthopedics;  Laterality: N/A;  3 hrs   COLONOSCOPY  [id number]   09/02/14 polyps x 3 ([facility])   GANGLION CYST EXCISION Left 2006   HEEL SPUR SURGERY Right 2013   HEEL SPUR SURGERY Right    2nd surgery   INGUINAL HERNIA REPAIR Left 04/26/2014   Procedure: LEFT INGUINAL HERNIA REPAIR WITH MESH;  Surgeon: Oza Blumenthal, MD;  Location: [facility] OR;  Service: General;  Laterality: Left;   INGUINAL HERNIA REPAIR Right 02/20/2016   Procedure: RIGHT INGUINAL HERNIA REPAIR WITH MESH;  Surgeon: Oza Blumenthal,  MD;  Location: [facility] OR;  Service: General;  Laterality: Right;   INSERTION OF MESH Left 04/26/2014   Procedure: INSERTION OF MESH;  Surgeon: Oza Blumenthal, MD;  Location: [facility] OR;  Service: General;  Laterality: Left;   INSERTION OF MESH Right 02/20/2016   Procedure: INSERTION OF MESH;  Surgeon: Oza Blumenthal, MD;  Location: [facility] OR;  Service: General;  Laterality: Right;   KNEE ARTHROSCOPY Left 02/2015   left   KNEE ARTHROSCOPY Left 12/2015   KNEE ARTHROSCOPY Right    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   TOTAL KNEE REVISION Right 11/18/2022   Procedure: TOTAL KNEE REVISION FEMORAL COMPONENT ONLY;  Surgeon: Claiborne Crew, MD;  Location: [facility] ORS;  Service: Orthopedics;  Laterality:  Right;   WRIST SURGERY      Family History  Problem Relation Age of Onset   Scoliosis Mother    Healthy Brother     Social History Social History   Tobacco Use   Smoking status: Former    Current packs/day: 0.00    Types: Cigarettes    Start date: 1972    Quit date: 1989    Years since quitting: 36.4    Passive exposure: Never   Smokeless tobacco: Never   Tobacco comments:    quit 1989  Vaping Use   Vaping status: Never Used  Substance Use Topics   Alcohol use: Yes    Alcohol/week: 6.0 - 12.0 standard drinks of alcohol    Types: 6 - 12 Cans of beer per week    Comment: occasional   Drug use: No    Current Outpatient Medications  Medication Sig Dispense Refill   aspirin  EC 81 MG tablet Take 1 tablet (81 mg total) by mouth daily. Swallow whole. 30 tablet 0   lisinopril  (ZESTRIL ) 5 MG tablet Take 5 mg by mouth daily.     Multiple Vitamins-Minerals (MULTIVITAMIN PO) Take 1 tablet by mouth daily.      No current facility-administered medications for this visit.    Allergies  Allergen Reactions   Aspirin  Other (See Comments)    High doses contraindicated due to ulcer   Codeine Nausea And Vomiting   Oxycodone -Acetaminophen  Nausea And Vomiting    Review of Systems  Constitutional:  Negative for activity change and fatigue.  HENT: Negative.  Negative for dental problem.   Eyes: Negative.   Respiratory:  Negative for shortness of breath.   Cardiovascular:  Negative for chest pain and leg swelling.  Gastrointestinal: Negative.   Endocrine: Negative.   Genitourinary: Negative.   Musculoskeletal:        Hx of bilateral knee arthritis s/p bilateral TKR.  Skin: Negative.   Allergic/Immunologic: Negative.   Neurological:  Negative for dizziness and syncope.  Hematological: Negative.   Psychiatric/Behavioral: Negative.      BP (!) 147/87 (BP Location: Left Arm, Patient Position: Sitting, Cuff Size: Normal)   Pulse 81   Resp 20   Ht 5\' 11"  (1.803 m)   Wt 182 lb  12.8 oz (82.9 kg)   SpO2 99% Comment: RA  BMI 25.50 kg/m  Physical Exam Constitutional:      Appearance: Normal appearance. He is normal weight.  HENT:     Head: Normocephalic and atraumatic.  Eyes:     Extraocular Movements: Extraocular movements intact.     Conjunctiva/sclera: Conjunctivae normal.     Pupils: Pupils are equal, round, and reactive to light.  Cardiovascular:     Rate and Rhythm: Normal rate and regular rhythm.  Pulses: Normal pulses.     Comments: 3/6 systolic murmur RSB, no diastolic murmur Pulmonary:     Effort: Pulmonary effort is normal.     Breath sounds: Normal breath sounds.  Abdominal:     General: Abdomen is flat.     Palpations: Abdomen is soft.     Tenderness: There is no abdominal tenderness.  Musculoskeletal:        General: No swelling.  Skin:    General: Skin is warm and dry.  Neurological:     General: No focal deficit present.     Mental Status: He is alert and oriented to person, place, and time.  Psychiatric:        Mood and Affect: Mood normal.        Behavior: Behavior normal.     Diagnostic Tests:  ECHOCARDIOGRAM REPORT       Patient Name:   Chase Lowe Date of Exam: 11/04/2023  Medical Rec #:  [id number]        Height:       71.0 in  Accession #:    [id number]       Weight:       182.2 lb  Date of Birth:  1960-11-08        BSA:          2.027 m  Patient Age:    62 years         BP:           110/70 mmHg  Patient Gender: M                HR:           74 bpm.  Exam Location:  Outpatient   Procedure: 2D Echo, Cardiac Doppler and Color Doppler (Both Spectral and  Color            Flow Doppler were utilized during procedure).   Indications:    Heart murmur, systolic [R01.1 ([id number])]    History:        Patient has no prior history of Echocardiogram  examinations.                 Risk Factors:Former Smoker.    Sonographer:    Delford Felling MHA, RDMS, RVT, RDCS  Referring Phys: [id number] PHILIP H MCGOWEN    IMPRESSIONS     1. The aortic valve is abnormal with indeterminant number of cusps.  Unable to determine aortic valve morphology due to image quality. There is  moderate calcification of the aortic valve. Aortic valve regurgitation is  not visualized. Moderate aortic  valve stenosis. Aortic valve area, by VTI measures 0.71 cm. Aortic valve  mean gradient measures 25.0 mmHg. Aortic valve Vmax measures 3.37 m/s.   2. Aortic dilatation noted. Aneurysm of the ascending aorta, measuring 47  mm. There is moderate dilatation of the ascending aorta.   3. Left ventricular ejection fraction, by estimation, is 55 to 60%. Left  ventricular ejection fraction by 3D volume is 57 %. The left ventricle has  normal function. The left ventricle has no regional wall motion  abnormalities. There is mild left  ventricular hypertrophy. Left ventricular diastolic parameters were  normal. The average left ventricular global longitudinal strain is -18.3  %. The global longitudinal strain is normal.   4. Normal right ventricular free wall strain, -28.1%. . Right ventricular  systolic function is normal. The right ventricular size is mildly  enlarged. Tricuspid regurgitation signal is inadequate for assessing  PA  pressure.   5. The mitral valve is grossly normal. Mild mitral valve regurgitation.  No evidence of mitral stenosis.   6. The inferior vena cava is normal in size with greater than 50%  respiratory variability, suggesting right atrial pressure of 3 mmHg.   FINDINGS   Left Ventricle: Left ventricular ejection fraction, by estimation, is 55  to 60%. Left ventricular ejection fraction by 3D volume is 57 %. The left  ventricle has normal function. The left ventricle has no regional wall  motion abnormalities. The average  left ventricular global longitudinal strain is -18.3 %. Strain was  performed and the global longitudinal strain is normal. The left  ventricular internal cavity size was normal in  size. There is mild left  ventricular hypertrophy. Left ventricular  diastolic parameters were normal.   Right Ventricle: Normal right ventricular free wall strain, -28.1%. The  right ventricular size is mildly enlarged. No increase in right  ventricular wall thickness. Right ventricular systolic function is normal.  Tricuspid regurgitation signal is  inadequate for assessing PA pressure. The tricuspid regurgitant velocity  is 1.51 m/s, and with an assumed right atrial pressure of 3 mmHg, the  estimated right ventricular systolic pressure is 12.1 mmHg.   Left Atrium: Grossly normal left atrial reservoir strain, 37.1%. Left  atrial size was normal in size.   Right Atrium: Right atrial size was normal in size.   Pericardium: There is no evidence of pericardial effusion.   Mitral Valve: The mitral valve is grossly normal. Mild mitral valve  regurgitation. No evidence of mitral valve stenosis.   Tricuspid Valve: The tricuspid valve is normal in structure. Tricuspid  valve regurgitation is trivial. No evidence of tricuspid stenosis.   Aortic Valve: The aortic valve has an indeterminant number of cusps. There  is moderate calcification of the aortic valve. Aortic valve regurgitation  is not visualized. Moderate aortic stenosis is present. Aortic valve mean  gradient measures 25.0 mmHg.  Aortic valve peak gradient measures 45.5 mmHg. Aortic valve area, by VTI  measures 0.71 cm.   Pulmonic Valve: The pulmonic valve was not well visualized. Pulmonic valve  regurgitation is not visualized. No evidence of pulmonic stenosis.   Aorta: Aortic dilatation noted. There is moderate dilatation of the  ascending aorta. There is an aneurysm involving the ascending aorta  measuring 47 mm.   Venous: The inferior vena cava is normal in size with greater than 50%  respiratory variability, suggesting right atrial pressure of 3 mmHg.   IAS/Shunts: No atrial level shunt detected by color flow Doppler.    Additional Comments: 3D was performed not requiring image post processing  on an independent workstation and was normal.     LEFT VENTRICLE  PLAX 2D  LVIDd:         4.26 cm         Diastology  LVIDs:         2.81 cm         LV e' medial:    7.51 cm/s  LV PW:         1.13 cm         LV E/e' medial:  12.6  LV IVS:        1.18 cm         LV e' lateral:   10.10 cm/s  LVOT diam:     1.77 cm         LV E/e' lateral: 9.4  LV SV:  52  LV SV Index:   26              2D Longitudinal  LVOT Area:     2.46 cm        Strain                                 2D Strain GLS   -18.3 %                                 Avg:                                   3D Volume EF                                 LV 3D EF:    Left                                              ventricul                                              ar                                              ejection                                              fraction                                              by 3D                                              volume is                                              57 %.                                   3D Volume EF:                                 3D EF:        57 %  LV EDV:       146 ml                                 LV ESV:       63 ml                                 LV SV:        83 ml   RIGHT VENTRICLE             IVC  RV Basal diam:  3.73 cm     IVC diam: 1.59 cm  RV Mid diam:    3.58 cm  RV S prime:     13.20 cm/s  TAPSE (M-mode): 1.9 cm   LEFT ATRIUM             Index        RIGHT ATRIUM           Index  LA diam:        2.87 cm 1.42 cm/m   RA Area:     16.10 cm  LA Vol (A2C):   44.4 ml 21.91 ml/m  RA Volume:   44.40 ml  21.91 ml/m  LA Vol (A4C):   31.8 ml 15.69 ml/m  LA Biplane Vol: 38.3 ml 18.90 ml/m   AORTIC VALVE  AV Area (Vmax):    0.69 cm  AV Area (Vmean):   0.71 cm  AV Area (VTI):     0.71 cm  AV Vmax:            337.33 cm/s  AV Vmean:          224.200 cm/s  AV VTI:            0.729 m  AV Peak Grad:      45.5 mmHg  AV Mean Grad:      25.0 mmHg  LVOT Vmax:         94.00 cm/s  LVOT Vmean:        64.800 cm/s  LVOT VTI:          0.211 m  LVOT/AV VTI ratio: 0.29    AORTA  Ao Root diam: 3.46 cm  Ao Asc diam:  4.71 cm   MITRAL VALVE               TRICUSPID VALVE  MV Area (PHT): 3.99 cm    TR Peak grad:   9.1 mmHg  MV Decel Time: 190 msec    TR Vmax:        151.00 cm/s  MR Peak grad: 46.0 mmHg  MR Vmax:      339.00 cm/s  SHUNTS  MV E velocity: 94.60 cm/s  Systemic VTI:  0.21 m  MV A velocity: 96.60 cm/s  Systemic Diam: 1.77 cm  MV E/A ratio:  0.98   Grady Lawman MD  Electronically signed by Grady Lawman MD  Signature Date/Time: 11/07/2023/7:44:47 AM        Final     Narrative & Impression  EXAMINATION: CT ANGIO CHEST AORTA W & OR WO CONTRAST   CLINICAL INDICATION: Male, 63 years old. Aortic aneurysm suspected   TECHNIQUE: Axial CTA of the chest with 100 cc Omnipaque  300 intravenous contrast. Multiplanar and 3D reformations provided. Unless otherwise specified, incidental thyroid, adrenal, renal lesions do not require dedicated imaging  follow up. Additionally, any mentioned pulmonary nodules do not require dedicated imaging follow-up based on the Fleischner guidelines unless otherwise specified. Coronary calcifications are not identified unless otherwise specified.   COMPARISON:   FINDINGS:   There is a 5.1 cm ascending thoracic aortic aneurysm.   The aortic arch and descending thoracic aorta are normal in caliber. The origins of the great vessels are patent.   The visualized thyroid is normal. The main pulmonary artery is normal in caliber. The heart is normal in size. There are coronary calcifications. There is no free fluid or pathologic lymphadenopathy by size criteria. The visualized upper abdominal structures appear within normal limits. The trachea and mainstem  bronchi are patent. Subsegmental atelectatic changes are noted within the lingula base is. The lungs are otherwise clear. There are degenerative changes of the spine which are mild.   IMPRESSION:   5.1 cm ascending thoracic aortic aneurysm.   DOSE REDUCTION: This exam was performed according to our departmental dose-optimization program which includes automated exposure control, adjustment of the mA and/or kV according to patient size and/or use of iterative reconstruction technique.   Electronically signed by: Italy Engel MD 11/22/2023 06:56 AM EDT RP Workstation: [id number]      Impression:  This 63 year old gentleman has stage C asymptomatic severe low-flow/low gradient bicuspid aortic valve stenosis and a 5.1 cm fusiform ascending aortic aneurysm.  I have personally reviewed his 2D echocardiogram and CTA studies.  His echocardiogram shows a severely calcified aortic valve which looks bicuspid to me.  The mean gradient was only 25 mmHg but the valve area was measured at 0.71 cm and the stroke-volume index was low.  It looks like a severely stenotic valve with poor leaflet mobility.  CTA of the chest shows some enlargement of the aortic root which I measure at 4.3 cm with a high takeoff of the left main and the ascending aorta has fusiform enlargement to 5.1 cm.  It decreases back towards normal size at the takeoff of the innominate artery.  I think the best treatment for this is replacement of the aortic valve and ascending aortic aneurysm by Bentall procedure using a period of deep hypothermic circulatory arrest to replace the ascending aorta up to the takeoff of the innominate artery with hemiarch replacement.  I reviewed the echo and CT images with the patient and his wife and discussed the surgical procedure.  All their questions have been answered at this time.  He will require cardiac catheterization preoperatively.  I do not think this requires urgent surgical repair since he is  asymptomatic and has just reached the surgical therapy threshold for his aneurysm.  I have recommended that he have it fixed sometime in the next few months.  We discussed the pros and cons of mechanical and bioprosthetic valves and I have recommended a bioprosthetic valve given his age of 40 and likelihood of further degenerative arthritis requiring anti-inflammatory agents. I discussed the operative procedure with the patient and his wife including alternatives, benefits and risks; including but not limited to bleeding, blood transfusion, infection, stroke, myocardial infarction, graft failure, heart block requiring a permanent pacemaker, organ dysfunction, and death.  Chase Lowe understands and agrees to proceed.    Plan:  He is going to discuss this further with his wife and decide on the surgical timing.  He will call us  back to schedule Bentall procedure using a bioprosthetic valve conduit and replacement of the ascending aortic aneurysm under deep hypothermic circulatory arrest.  He will  have cardiac catheterization scheduled a few weeks preoperatively.  I spent 60 minutes performing this consultation and > 50% of this time was spent face to face counseling and coordinating the care of this patient's severe bicuspid aortic valve stenosis and aortic root/ascending aortic aneurysm.   Bartley Lightning, MD [facility] [phone]

## 2023-12-07 ENCOUNTER — Encounter: Admitting: Surgery

## 2023-12-12 ENCOUNTER — Other Ambulatory Visit (HOSPITAL_COMMUNITY): Payer: Self-pay

## 2023-12-12 ENCOUNTER — Other Ambulatory Visit: Payer: Self-pay

## 2023-12-12 MED ORDER — LISINOPRIL 5 MG PO TABS
5.0000 mg | ORAL_TABLET | Freq: Every day | ORAL | 0 refills | Status: DC
  Filled 2023-12-12: qty 30, 30d supply, fill #0

## 2023-12-12 NOTE — Telephone Encounter (Signed)
 Copied from CRM [id number]. Topic: Clinical - Medication Refill >> Dec 12, 2023 12:13 PM Emmet Harm C wrote: Medication: lisinopril  (ZESTRIL ) 5 MG tablet  Has the patient contacted their pharmacy? No (Agent: If no, request that the patient contact the pharmacy for the refill. If patient does not wish to contact the pharmacy document the reason why and proceed with request.) (Agent: If yes, when and what did the pharmacy advise?)  This is the patient's preferred pharmacy:  [place] [facility] [address] [place] [address] Phone: [phone] Fax: [phone]  Is this the correct pharmacy for this prescription? Yes If no, delete pharmacy and type the correct one.   Has the prescription been filled recently? No  Is the patient out of the medication? Yes  Has the patient been seen for an appointment in the last year OR does the patient have an upcoming appointment? Yes  Can we respond through MyChart? No  Agent: Please be advised that Rx refills may take up to 3 business days. We ask that you follow-up with your pharmacy.

## 2023-12-12 NOTE — Addendum Note (Signed)
 Addended by: Aniruddh Ciavarella M on: 12/12/2023 02:18 PM   Modules accepted: Orders

## 2023-12-12 NOTE — Telephone Encounter (Signed)
 Refill sent.

## 2023-12-20 ENCOUNTER — Other Ambulatory Visit: Payer: Self-pay

## 2023-12-20 ENCOUNTER — Other Ambulatory Visit (HOSPITAL_COMMUNITY): Payer: Self-pay

## 2023-12-20 DIAGNOSIS — H524 Presbyopia: Secondary | ICD-10-CM | POA: Diagnosis not present

## 2023-12-20 MED ORDER — KETOROLAC TROMETHAMINE 0.5 % OP SOLN
1.0000 [drp] | Freq: Every day | OPHTHALMIC | 0 refills | Status: DC | PRN
  Filled 2023-12-20: qty 5, 45d supply, fill #0

## 2023-12-21 ENCOUNTER — Encounter: Admitting: Surgery

## 2023-12-23 NOTE — H&P (View-Only) (Signed)
 Cardiology Office Note:   Date:  12/26/2023  ID:  Chase Lowe, DOB 03/17/1961, MRN [id number] PCP:  Candise Aleene DEL, MD  [facility] Providers Cardiologist:  Wendel Haws, MD Referring MD: Candise Aleene DEL, MD  Chief Complaint/Reason for Referral:  Preoperative cardiac catheterization ASSESSMENT:    1. Preoperative cardiovascular examination   2. Aneurysm of ascending aorta without rupture (HCC)   3. 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 Primary hypertension   5. Hyperlipidemia LDL goal <70   6. CKD (chronic kidney disease) stage 2, GFR 60-89 ml/min     PLAN:   In order of problems listed above: Preoperative cardiovascular examination: Will refer for coronary angiography and right heart catheterization. Ascending aortic aneurysm: To undergo AVR and Bentall procedure by Dr. Lucas.  Continue aspirin  81 mg Bicuspid aortic stenosis: See #2 above.  I did stress to the patient that his sons, daughter, and brothers and sisters need to be screened for aortopathy and bicuspid aortic valve.  He understands this completely. Hypertension: Continue lisinopril  5 mg daily.  BP well-controlled. Hyperlipidemia: Would recommend statin after his surgical procedure. CKD stage II: Continue lisinopril  5 mg daily.            Dispo:  No follow-ups on file.      Medication Adjustments/Labs and Tests Ordered: Current medicines are reviewed at length with the patient today.  Concerns regarding medicines are outlined above.  The following changes have been made:  no change   Labs/tests ordered: Orders Placed This Encounter  Procedures   EKG 12-Lead    Medication Changes: No orders of the defined types were placed in this encounter.   Current medicines are reviewed at length with the patient today.  The patient does not have concerns regarding medicines.     History of Present Illness:    FOCUSED PROBLEM LIST:   Aortic stenosis, bicuspid Paradoxical low-flow  low gradient AVA 0.71, MG 25, V-max 3.37, SVI 26, EF 55 to 60% Ascending aortic aneurysm 5.1 cm Hypertension Hyperlipidemia CKD stage II BMI 25/BSA 2.0  June 2025:  Patient consents to use of AI scribe. The patient is 63 year old male who was seen by Dr. Lucas recently.  He was diagnosed with paradoxical low-flow low gradient bicuspid aortic valve stenosis with an associated 5.1 cm ascending aortic aneurysm.  He has been referred for aortic valve replacement and ascending aortic aneurysm repair with a Bentall procedure.  He is here to discuss preoperative cardiac catheterization.  He is scheduled for a heart catheterization procedure in two days to evaluate his heart and measure blood pressure in his heart and lungs. He is concerned about his family history of heart issues, as his father had a mechanical valve replacement at age 51 and lived to 60.  He feels more tired than usual following a knee replacement surgery 11 weeks ago. No lightheadedness, blacking out spells, or dizziness. He remains active, working outside and participating in physical activities such as running 5Ks.  He takes 81 mg of aspirin  daily and a vitamin.     Current Medications: Current Meds  Medication Sig   aspirin  EC 81 MG tablet Take 1 tablet (81 mg total) by mouth daily. Swallow whole.   ketorolac  (ACULAR ) 0.5 % ophthalmic solution Place 1 drop into both eyes daily as needed.   lisinopril  (ZESTRIL ) 5 MG tablet Take 1 tablet (5 mg total) by mouth daily.   Multiple Vitamins-Minerals (MULTIVITAMIN PO) Take 1 tablet by mouth daily.  Review of Systems:   Please see the history of present illness.    All other systems reviewed and are negative.     EKGs/Labs/Other Test Reviewed:   EKG: EKG shows normal sinus rhythm with left ventricular hypertrophy  EKG Interpretation Date/Time:  Monday December 26 2023 15:00:52 EDT Ventricular Rate:  80 PR Interval:  172 QRS Duration:  102 QT Interval:  364 QTC  Calculation: 419 R Axis:   -29  Text Interpretation: Normal sinus rhythm Normal ECG When compared with ECG of 11-Nov-2022 14:24, No significant change was found Confirmed by Wendel Haws ([id number]) on 12/26/2023 3:07:45 PM         Risk Assessment/Calculations:          Physical Exam:   VS:  BP 118/80   Pulse 77   Ht 5' 11 (1.803 m)   Wt 178 lb (80.7 kg)   SpO2 98%   BMI 24.83 kg/m        Wt Readings from Last 3 Encounters:  12/26/23 178 lb (80.7 kg)  12/01/23 182 lb 12.8 oz (82.9 kg)  09/07/23 182 lb 3.2 oz (82.6 kg)      GENERAL:  No apparent distress, AOx3 HEENT:  No carotid bruits, +2 carotid impulses, no scleral icterus CAR: RRR  2/6 SEM no gallops, rubs, or thrills RES:  Clear to auscultation bilaterally ABD:  Soft, nontender, nondistended, positive bowel sounds x 4 VASC:  +2 radial pulses, +2 carotid pulses NEURO:  CN 2-12 grossly intact; motor and sensory grossly intact PSYCH:  No active depression or anxiety EXT:  No edema, ecchymosis, or cyanosis  Signed, Maximino Cozzolino K Meila Berke, MD  12/26/2023 3:31 PM    [facility] [address] [place], [place], [place]  [address] Phone: [phone]; Fax: [phone]   Note:  This document was prepared using Dragon voice recognition software and may include unintentional dictation errors.

## 2023-12-23 NOTE — Progress Notes (Unsigned)
 Cardiology Office Note:   Date:  12/23/2023  ID:  Chase Lowe, DOB 16-Dec-1960, MRN [id number] PCP:  Shelvia Dick, MD  [facility] Providers Cardiologist:  Alyssa Backbone, MD Referring MD: Shelvia Dick, MD  Chief Complaint/Reason for Referral:  Preoperative cardiac catheterization ASSESSMENT:    1. Preoperative cardiovascular examination   2. Aneurysm of ascending aorta without rupture (HCC)   3. 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 Primary hypertension   5. Hyperlipidemia LDL goal <70   6. CKD (chronic kidney disease) stage 2, GFR 60-89 ml/min     PLAN:   In order of problems listed above: Preoperative cardiovascular examination: Will refer for coronary angiography and right heart catheterization. Ascending aortic aneurysm: To undergo AVR and Bentall procedure by Dr. Sherene Dilling.  Continue aspirin  81 mg Bicuspid aortic stenosis: See #2 above. Hypertension: Continue lisinopril  5 mg daily. Hyperlipidemia: Would recommend statin after his surgical procedure. CKD stage II: Continue lisinopril  5 mg daily.        {Are you ordering a CV Procedure (e.g. stress test, cath, DCCV, TEE, etc)?   Press F2        :[id number]}   Dispo:  No follow-ups on file.      Medication Adjustments/Labs and Tests Ordered: Current medicines are reviewed at length with the patient today.  Concerns regarding medicines are outlined above.  The following changes have been made:  {PLAN; NO CHANGE:13088:s}   Labs/tests ordered: No orders of the defined types were placed in this encounter.   Medication Changes: No orders of the defined types were placed in this encounter.   Current medicines are reviewed at length with the patient today.  The patient {ACTIONS; HAS/DOES NOT HAVE:19233} concerns regarding medicines.  I spent *** minutes reviewing all clinical data during and prior to this visit including all relevant imaging studies, laboratories, clinical information from other  health systems and prior notes from both Cardiology and other specialties, interviewing the patient, conducting a complete physical examination, and coordinating care in order to formulate a comprehensive and personalized evaluation and treatment plan.   History of Present Illness:    FOCUSED PROBLEM LIST:   Aortic stenosis, bicuspid Paradoxical low-flow low gradient AVA 0.71, MG 25, V-max 3.37, SVI 26, EF 55 to 60% Ascending aortic aneurysm 5.1 cm Hypertension Hyperlipidemia CKD stage II BMI 25/BSA 2.0  June 2025:  Patient consents to use of AI scribe. The patient is 63 year old male who was seen by Dr. Sherene Dilling recently.  He was diagnosed with paradoxical low-flow low gradient bicuspid aortic valve stenosis with an associated 5.1 cm ascending aortic aneurysm.  He has been referred for aortic valve replacement and ascending aortic aneurysm repair with a Bentall procedure.  He is here to discuss preoperative cardiac catheterization.     Current Medications: No outpatient medications have been marked as taking for the 12/26/23 encounter (Appointment) with Angelo Prindle K, MD.     Review of Systems:   Please see the history of present illness.    All other systems reviewed and are negative.     EKGs/Labs/Other Test Reviewed:   EKG: EKG shows normal sinus rhythm with left ventricular hypertrophy  EKG Interpretation Date/Time:    Ventricular Rate:    PR Interval:    QRS Duration:    QT Interval:    QTC Calculation:   R Axis:      Text Interpretation:           Risk Assessment/Calculations:   {  Does this patient have ATRIAL FIBRILLATION?:[phone]}      Physical Exam:   VS:  There were no vitals taken for this visit.   No BP recorded.  {Refresh Note OR Click here to enter BP  :1}***   Wt Readings from Last 3 Encounters:  12/01/23 182 lb 12.8 oz (82.9 kg)  09/07/23 182 lb 3.2 oz (82.6 kg)  07/15/23 204 lb (92.5 kg)      GENERAL:  No apparent distress, AOx3 HEENT:   No carotid bruits, +2 carotid impulses, no scleral icterus CAR: RRR Irregular RR*** no murmurs***, gallops, rubs, or thrills RES:  Clear to auscultation bilaterally ABD:  Soft, nontender, nondistended, positive bowel sounds x 4 VASC:  +2 radial pulses, +2 carotid pulses NEURO:  CN 2-12 grossly intact; motor and sensory grossly intact PSYCH:  No active depression or anxiety EXT:  No edema, ecchymosis, or cyanosis  Signed, Lessly Stigler K Adabelle Griffiths, MD  12/23/2023 3:12 PM    [facility] [address] [place], [place], [place]  [address] Phone: [phone]; Fax: [phone]   Note:  This document was prepared using Dragon voice recognition software and may include unintentional dictation errors.

## 2023-12-26 ENCOUNTER — Ambulatory Visit: Attending: Internal Medicine | Admitting: Internal Medicine

## 2023-12-26 ENCOUNTER — Encounter: Payer: Self-pay | Admitting: Internal Medicine

## 2023-12-26 VITALS — BP 118/80 | HR 77 | Ht 71.0 in | Wt 178.0 lb

## 2023-12-26 DIAGNOSIS — I10 Essential (primary) hypertension: Secondary | ICD-10-CM | POA: Diagnosis not present

## 2023-12-26 DIAGNOSIS — I350 Nonrheumatic aortic (valve) stenosis: Secondary | ICD-10-CM

## 2023-12-26 DIAGNOSIS — E785 Hyperlipidemia, unspecified: Secondary | ICD-10-CM | POA: Diagnosis not present

## 2023-12-26 DIAGNOSIS — N182 Chronic kidney disease, stage 2 (mild): Secondary | ICD-10-CM

## 2023-12-26 DIAGNOSIS — I7121 Aneurysm of the ascending aorta, without rupture: Secondary | ICD-10-CM | POA: Diagnosis not present

## 2023-12-26 DIAGNOSIS — Q2381 Bicuspid aortic valve: Secondary | ICD-10-CM | POA: Diagnosis not present

## 2023-12-26 DIAGNOSIS — Z01810 Encounter for preprocedural cardiovascular examination: Secondary | ICD-10-CM

## 2023-12-26 NOTE — Patient Instructions (Signed)
 Medication Instructions:  No changes *If you need a refill on your cardiac medications before your next appointment, please call your pharmacy*  Lab Work: Today: go to first floor lab for blood work (bmet, cbc)  If you have labs (blood work) drawn today and your tests are completely normal, you will receive your results only by: [employer] (if you have MyChart) OR A paper copy in the mail If you have any lab test that is abnormal or we need to change your treatment, we will call you to review the results.  Testing/Procedures: Heart cath as planned  Follow-Up: As planned post op.

## 2023-12-27 ENCOUNTER — Ambulatory Visit: Payer: Self-pay | Admitting: Internal Medicine

## 2023-12-27 LAB — BASIC METABOLIC PANEL WITH GFR
BUN/Creatinine Ratio: 15 (ref 10–24)
BUN: 15 mg/dL (ref 8–27)
CO2: 21 mmol/L (ref 20–29)
Calcium: 9.8 mg/dL (ref 8.6–10.2)
Chloride: 103 mmol/L (ref 96–106)
Creatinine, Ser: 0.99 mg/dL (ref 0.76–1.27)
Glucose: 86 mg/dL (ref 70–99)
Potassium: 5 mmol/L (ref 3.5–5.2)
Sodium: 140 mmol/L (ref 134–144)
eGFR: 86 mL/min/{1.73_m2} (ref >59)

## 2023-12-27 LAB — CBC
Hematocrit: 41.6 % (ref 37.5–51.0)
Hemoglobin: 13.8 g/dL (ref 13.0–17.7)
MCH: 30.4 pg (ref 26.6–33.0)
MCHC: 33.2 g/dL (ref 31.5–35.7)
MCV: 92 fL (ref 79–97)
Platelets: 147 10*3/uL — ABNORMAL LOW (ref 150–450)
RBC: 4.54 x10E6/uL (ref 4.14–5.80)
RDW: 12.4 % (ref 11.6–15.4)
WBC: 6.3 10*3/uL (ref 3.4–10.8)

## 2023-12-28 ENCOUNTER — Encounter (HOSPITAL_COMMUNITY)
Admission: RE | Disposition: A | Discharge status: Home / Self Care | Payer: Self-pay | Source: Home / Self Care | Attending: Internal Medicine

## 2023-12-28 ENCOUNTER — Ambulatory Visit (HOSPITAL_COMMUNITY)
Admission: RE | Admit: 2023-12-28 | Discharge: 2023-12-28 | Disposition: A | Discharge status: Home / Self Care | Attending: Internal Medicine | Admitting: Internal Medicine

## 2023-12-28 ENCOUNTER — Encounter: Payer: Self-pay | Admitting: Internal Medicine

## 2023-12-28 ENCOUNTER — Encounter (HOSPITAL_COMMUNITY): Payer: Self-pay | Admitting: Internal Medicine

## 2023-12-28 ENCOUNTER — Other Ambulatory Visit: Payer: Self-pay

## 2023-12-28 DIAGNOSIS — Z79899 Other long term (current) drug therapy: Secondary | ICD-10-CM | POA: Diagnosis not present

## 2023-12-28 DIAGNOSIS — I7121 Aneurysm of the ascending aorta, without rupture: Secondary | ICD-10-CM | POA: Insufficient documentation

## 2023-12-28 DIAGNOSIS — N182 Chronic kidney disease, stage 2 (mild): Secondary | ICD-10-CM | POA: Insufficient documentation

## 2023-12-28 DIAGNOSIS — I2510 Atherosclerotic heart disease of native coronary artery without angina pectoris: Secondary | ICD-10-CM | POA: Diagnosis not present

## 2023-12-28 DIAGNOSIS — Q2381 Bicuspid aortic valve: Secondary | ICD-10-CM | POA: Diagnosis not present

## 2023-12-28 DIAGNOSIS — I350 Nonrheumatic aortic (valve) stenosis: Secondary | ICD-10-CM | POA: Diagnosis not present

## 2023-12-28 DIAGNOSIS — Z7982 Long term (current) use of aspirin: Secondary | ICD-10-CM | POA: Diagnosis not present

## 2023-12-28 DIAGNOSIS — I129 Hypertensive chronic kidney disease with stage 1 through stage 4 chronic kidney disease, or unspecified chronic kidney disease: Secondary | ICD-10-CM | POA: Insufficient documentation

## 2023-12-28 DIAGNOSIS — I712 Thoracic aortic aneurysm, without rupture, unspecified: Secondary | ICD-10-CM | POA: Insufficient documentation

## 2023-12-28 DIAGNOSIS — E785 Hyperlipidemia, unspecified: Secondary | ICD-10-CM | POA: Diagnosis not present

## 2023-12-28 HISTORY — PX: RIGHT/LEFT HEART CATH AND CORONARY ANGIOGRAPHY: CATH118266

## 2023-12-28 LAB — POCT I-STAT EG7
Acid-base deficit: 2 mmol/L (ref 0.0–2.0)
Acid-base deficit: 2 mmol/L (ref 0.0–2.0)
Bicarbonate: 23.1 mmol/L (ref 20.0–28.0)
Bicarbonate: 23.2 mmol/L (ref 20.0–28.0)
Calcium, Ion: 1.24 mmol/L (ref 1.15–1.40)
Calcium, Ion: 1.3 mmol/L (ref 1.15–1.40)
HCT: 35 % — ABNORMAL LOW (ref 39.0–52.0)
HCT: 35 % — ABNORMAL LOW (ref 39.0–52.0)
Hemoglobin: 11.9 g/dL — ABNORMAL LOW (ref 13.0–17.0)
Hemoglobin: 11.9 g/dL — ABNORMAL LOW (ref 13.0–17.0)
O2 Saturation: 76 %
O2 Saturation: 76 %
Potassium: 3.8 mmol/L (ref 3.5–5.1)
Potassium: 3.9 mmol/L (ref 3.5–5.1)
Sodium: 139 mmol/L (ref 135–145)
Sodium: 139 mmol/L (ref 135–145)
TCO2: 24 mmol/L (ref 22–32)
TCO2: 24 mmol/L (ref 22–32)
pCO2, Ven: 39.5 mmHg — ABNORMAL LOW (ref 44–60)
pCO2, Ven: 40.9 mmHg — ABNORMAL LOW (ref 44–60)
pH, Ven: 7.361 (ref 7.25–7.43)
pH, Ven: 7.374 (ref 7.25–7.43)
pO2, Ven: 42 mmHg (ref 32–45)
pO2, Ven: 42 mmHg (ref 32–45)

## 2023-12-28 SURGERY — RIGHT/LEFT HEART CATH AND CORONARY ANGIOGRAPHY
Anesthesia: LOCAL

## 2023-12-28 MED ORDER — SODIUM CHLORIDE 0.9 % IV SOLN: 250.0000 mL | INTRAVENOUS | Status: DC | PRN

## 2023-12-28 MED ORDER — MIDAZOLAM HCL 2 MG/2ML IJ SOLN
INTRAMUSCULAR | Status: AC
Start: 2023-12-28 — End: 2023-12-28
  Filled 2023-12-28: qty 2

## 2023-12-28 MED ORDER — IOHEXOL 350 MG/ML SOLN
INTRAVENOUS | Status: DC | PRN
  Administered 2023-12-28: 25 mL

## 2023-12-28 MED ORDER — LABETALOL HCL 5 MG/ML IV SOLN: 10.0000 mg | INTRAVENOUS | Status: DC | PRN

## 2023-12-28 MED ORDER — SODIUM CHLORIDE 0.9% FLUSH: 3.0000 mL | Freq: Two times a day (BID) | INTRAVENOUS | Status: DC

## 2023-12-28 MED ORDER — FENTANYL CITRATE (PF) 100 MCG/2ML IJ SOLN
INTRAMUSCULAR | Status: AC
  Filled 2023-12-28: qty 2

## 2023-12-28 MED ORDER — LIDOCAINE HCL (PF) 1 % IJ SOLN
INTRAMUSCULAR | Status: DC | PRN
  Administered 2023-12-28 (×2): 2 mL

## 2023-12-28 MED ORDER — FENTANYL CITRATE (PF) 100 MCG/2ML IJ SOLN
INTRAMUSCULAR | Status: DC | PRN
  Administered 2023-12-28: 25 ug via INTRAVENOUS

## 2023-12-28 MED ORDER — SODIUM CHLORIDE 0.9 % WEIGHT BASED INFUSION: 3.0000 mL/kg/h | INTRAVENOUS | Status: AC

## 2023-12-28 MED ORDER — LIDOCAINE HCL (PF) 1 % IJ SOLN
INTRAMUSCULAR | Status: AC
Start: 2023-12-28 — End: 2023-12-28
  Filled 2023-12-28: qty 30

## 2023-12-28 MED ORDER — HYDRALAZINE HCL 20 MG/ML IJ SOLN: 10.0000 mg | INTRAMUSCULAR | Status: DC | PRN

## 2023-12-28 MED ORDER — SODIUM CHLORIDE 0.9 % WEIGHT BASED INFUSION: 1.0000 mL/kg/h | INTRAVENOUS | Status: DC

## 2023-12-28 MED ORDER — VERAPAMIL HCL 2.5 MG/ML IV SOLN
INTRAVENOUS | Status: DC | PRN
  Administered 2023-12-28: 10 mL via INTRA_ARTERIAL

## 2023-12-28 MED ORDER — ACETAMINOPHEN 325 MG PO TABS
650.0000 mg | ORAL_TABLET | ORAL | Status: DC | PRN
  Administered 2023-12-28: 650 mg via ORAL
  Filled 2023-12-28: qty 2

## 2023-12-28 MED ORDER — HEPARIN SODIUM (PORCINE) 1000 UNIT/ML IJ SOLN
INTRAMUSCULAR | Status: DC | PRN
  Administered 2023-12-28: 5000 [IU] via INTRAVENOUS

## 2023-12-28 MED ORDER — SODIUM CHLORIDE 0.9% FLUSH: 3.0000 mL | INTRAVENOUS | Status: DC | PRN

## 2023-12-28 MED ORDER — VERAPAMIL HCL 2.5 MG/ML IV SOLN
INTRAVENOUS | Status: AC
Start: 2023-12-28 — End: 2023-12-28
  Filled 2023-12-28: qty 2

## 2023-12-28 MED ORDER — HEPARIN SODIUM (PORCINE) 1000 UNIT/ML IJ SOLN
INTRAMUSCULAR | Status: AC
  Filled 2023-12-28: qty 10

## 2023-12-28 MED ORDER — HEPARIN (PORCINE) IN NACL 1000-0.9 UT/500ML-% IV SOLN
INTRAVENOUS | Status: DC | PRN
  Administered 2023-12-28 (×2): 500 mL

## 2023-12-28 MED ORDER — ONDANSETRON HCL 4 MG/2ML IJ SOLN: 4.0000 mg | Freq: Four times a day (QID) | INTRAMUSCULAR | Status: DC | PRN

## 2023-12-28 MED ORDER — MIDAZOLAM HCL 2 MG/2ML IJ SOLN
INTRAMUSCULAR | Status: DC | PRN
  Administered 2023-12-28: 1 mg via INTRAVENOUS

## 2023-12-28 SURGICAL SUPPLY — 9 items
CATH BALLN WEDGE 5F 110CM (CATHETERS) IMPLANT
CATH INFINITI 5FR JL5 (CATHETERS) IMPLANT
CATH INFINITI JR4 5F (CATHETERS) IMPLANT
DEVICE RAD COMP TR BAND LRG (VASCULAR PRODUCTS) IMPLANT
GLIDESHEATH SLEND SS 6F .021 (SHEATH) IMPLANT
PACK CARDIAC CATHETERIZATION (CUSTOM PROCEDURE TRAY) ×2 IMPLANT
SET ATX-X65L (MISCELLANEOUS) IMPLANT
SHEATH GLIDE SLENDER 4/5FR (SHEATH) IMPLANT
WIRE EMERALD 3MM-J .035X260CM (WIRE) IMPLANT

## 2023-12-28 NOTE — Interval H&P Note (Signed)
 History and Physical Interval Note:  12/28/2023 10:18 AM  Chase Lowe  has presented today for surgery, with the diagnosis of aortic stenosis.  The various methods of treatment have been discussed with the patient and family. After consideration of risks, benefits and other options for treatment, the patient has consented to  Procedure(s): RIGHT/LEFT HEART CATH AND CORONARY ANGIOGRAPHY (N/A) as a surgical intervention.  The patient's history has been reviewed, patient examined, no change in status, stable for surgery.  I have reviewed the patient's chart and labs.  Questions were answered to the patient's satisfaction.     Jahvon Gosline K Quaniyah Bugh

## 2023-12-29 LAB — POCT I-STAT EG7
Acid-base deficit: 5 mmol/L — ABNORMAL HIGH (ref 0.0–2.0)
Bicarbonate: 20.7 mmol/L (ref 20.0–28.0)
Calcium, Ion: 1.17 mmol/L (ref 1.15–1.40)
HCT: 35 % — ABNORMAL LOW (ref 39.0–52.0)
Hemoglobin: 11.9 g/dL — ABNORMAL LOW (ref 13.0–17.0)
O2 Saturation: 78 %
Potassium: 3.5 mmol/L (ref 3.5–5.1)
Sodium: 133 mmol/L — ABNORMAL LOW (ref 135–145)
TCO2: 22 mmol/L (ref 22–32)
pCO2, Ven: 39.3 mmHg — ABNORMAL LOW (ref 44–60)
pH, Ven: 7.329 (ref 7.25–7.43)
pO2, Ven: 45 mmHg (ref 32–45)

## 2023-12-29 LAB — POCT I-STAT 7, (LYTES, BLD GAS, ICA,H+H)
Acid-base deficit: 1 mmol/L (ref 0.0–2.0)
Bicarbonate: 23.3 mmol/L (ref 20.0–28.0)
Calcium, Ion: 1.29 mmol/L (ref 1.15–1.40)
HCT: 36 % — ABNORMAL LOW (ref 39.0–52.0)
Hemoglobin: 12.2 g/dL — ABNORMAL LOW (ref 13.0–17.0)
O2 Saturation: 97 %
Potassium: 3.9 mmol/L (ref 3.5–5.1)
Sodium: 140 mmol/L (ref 135–145)
TCO2: 24 mmol/L (ref 22–32)
pCO2 arterial: 37.8 mmHg (ref 32–48)
pH, Arterial: 7.397 (ref 7.35–7.45)
pO2, Arterial: 87 mmHg (ref 83–108)

## 2024-01-05 ENCOUNTER — Other Ambulatory Visit: Payer: Self-pay | Admitting: Family Medicine

## 2024-01-05 ENCOUNTER — Other Ambulatory Visit: Payer: Self-pay

## 2024-01-05 MED ORDER — ASPIRIN 81 MG PO TBEC
81.0000 mg | DELAYED_RELEASE_TABLET | Freq: Every day | ORAL | 1 refills | Status: DC
  Filled 2024-01-05: qty 30, 30d supply, fill #0
  Filled 2024-02-03: qty 30, 30d supply, fill #1

## 2024-01-05 MED ORDER — LISINOPRIL 5 MG PO TABS
5.0000 mg | ORAL_TABLET | Freq: Every day | ORAL | 0 refills | Status: DC
  Filled 2024-01-05: qty 30, 30d supply, fill #0

## 2024-01-13 ENCOUNTER — Encounter: Payer: Self-pay | Admitting: Family Medicine

## 2024-01-13 ENCOUNTER — Ambulatory Visit (INDEPENDENT_AMBULATORY_CARE_PROVIDER_SITE_OTHER): Payer: Commercial Managed Care - PPO | Admitting: Family Medicine

## 2024-01-13 VITALS — BP 117/77 | HR 69 | Temp 97.0°F | Ht 71.0 in | Wt 182.8 lb

## 2024-01-13 DIAGNOSIS — Z860101 Personal history of adenomatous and serrated colon polyps: Secondary | ICD-10-CM

## 2024-01-13 DIAGNOSIS — Z125 Encounter for screening for malignant neoplasm of prostate: Secondary | ICD-10-CM

## 2024-01-13 DIAGNOSIS — Z0000 Encounter for general adult medical examination without abnormal findings: Secondary | ICD-10-CM

## 2024-01-13 DIAGNOSIS — R7301 Impaired fasting glucose: Secondary | ICD-10-CM | POA: Diagnosis not present

## 2024-01-13 DIAGNOSIS — I10 Essential (primary) hypertension: Secondary | ICD-10-CM

## 2024-01-13 LAB — HEMOGLOBIN A1C: Hgb A1c MFr Bld: 5.7 % (ref 4.6–6.5)

## 2024-01-13 LAB — BASIC METABOLIC PANEL WITH GFR
BUN: 14 mg/dL (ref 6–23)
CO2: 31 meq/L (ref 19–32)
Calcium: 9.6 mg/dL (ref 8.4–10.5)
Chloride: 103 meq/L (ref 96–112)
Creatinine, Ser: 0.93 mg/dL (ref 0.40–1.50)
GFR: 87.14 mL/min (ref >60.00)
Glucose, Bld: 92 mg/dL (ref 70–99)
Potassium: 4.2 meq/L (ref 3.5–5.1)
Sodium: 138 meq/L (ref 135–145)

## 2024-01-13 LAB — PSA: PSA: 1.06 ng/mL (ref 0.10–4.00)

## 2024-01-13 NOTE — Patient Instructions (Signed)
 Health Maintenance, Male  Adopting a healthy lifestyle and getting preventive care are important in promoting health and wellness. Ask your health care provider about:  The right schedule for you to have regular tests and exams.  Things you can do on your own to prevent diseases and keep yourself healthy.  What should I know about diet, weight, and exercise?  Eat a healthy diet    Eat a diet that includes plenty of vegetables, fruits, low-fat dairy products, and lean protein.  Do not eat a lot of foods that are high in solid fats, added sugars, or sodium.  Maintain a healthy weight  Body mass index (BMI) is a measurement that can be used to identify possible weight problems. It estimates body fat based on height and weight. Your health care provider can help determine your BMI and help you achieve or maintain a healthy weight.  Get regular exercise  Get regular exercise. This is one of the most important things you can do for your health. Most adults should:  Exercise for at least 150 minutes each week. The exercise should increase your heart rate and make you sweat (moderate-intensity exercise).  Do strengthening exercises at least twice a week. This is in addition to the moderate-intensity exercise.  Spend less time sitting. Even light physical activity can be beneficial.  Watch cholesterol and blood lipids  Have your blood tested for lipids and cholesterol at 64 years of age, then have this test every 5 years.  You may need to have your cholesterol levels checked more often if:  Your lipid or cholesterol levels are high.  You are older than 64 years of age.  You are at high risk for heart disease.  What should I know about cancer screening?  Many types of cancers can be detected early and may often be prevented. Depending on your health history and family history, you may need to have cancer screening at various ages. This may include screening for:  Colorectal cancer.  Prostate cancer.  Skin cancer.  Lung  cancer.  What should I know about heart disease, diabetes, and high blood pressure?  Blood pressure and heart disease  High blood pressure causes heart disease and increases the risk of stroke. This is more likely to develop in people who have high blood pressure readings or are overweight.  Talk with your health care provider about your target blood pressure readings.  Have your blood pressure checked:  Every 3-5 years if you are 9-95 years of age.  Every year if you are 85 years old or older.  If you are between the ages of 29 and 29 and are a current or former smoker, ask your health care provider if you should have a one-time screening for abdominal aortic aneurysm (AAA).  Diabetes  Have regular diabetes screenings. This checks your fasting blood sugar level. Have the screening done:  Once every three years after age 23 if you are at a normal weight and have a low risk for diabetes.  More often and at a younger age if you are overweight or have a high risk for diabetes.  What should I know about preventing infection?  Hepatitis B  If you have a higher risk for hepatitis B, you should be screened for this virus. Talk with your health care provider to find out if you are at risk for hepatitis B infection.  Hepatitis C  Blood testing is recommended for:  Everyone born from [birth year] through 1966.  Anyone  with known risk factors for hepatitis C.  Sexually transmitted infections (STIs)  You should be screened each year for STIs, including gonorrhea and chlamydia, if:  You are sexually active and are younger than 64 years of age.  You are older than 64 years of age and your health care provider tells you that you are at risk for this type of infection.  Your sexual activity has changed since you were last screened, and you are at increased risk for chlamydia or gonorrhea. Ask your health care provider if you are at risk.  Ask your health care provider about whether you are at high risk for HIV. Your health care provider  may recommend a prescription medicine to help prevent HIV infection. If you choose to take medicine to prevent HIV, you should first get tested for HIV. You should then be tested every 3 months for as long as you are taking the medicine.  Follow these instructions at home:  Alcohol use  Do not drink alcohol if your health care provider tells you not to drink.  If you drink alcohol:  Limit how much you have to 0-2 drinks a day.  Know how much alcohol is in your drink. In the [place] one drink equals one 12 oz bottle of beer (355 mL), one 5 oz glass of wine (148 mL), or one 1 oz glass of hard liquor (44 mL).  Lifestyle  Do not use any products that contain nicotine or tobacco. These products include cigarettes, chewing tobacco, and vaping devices, such as e-cigarettes. If you need help quitting, ask your health care provider.  Do not use street drugs.  Do not share needles.  Ask your health care provider for help if you need support or information about quitting drugs.  General instructions  Schedule regular health, dental, and eye exams.  Stay current with your vaccines.  Tell your health care provider if:  You often feel depressed.  You have ever been abused or do not feel safe at home.  Summary  Adopting a healthy lifestyle and getting preventive care are important in promoting health and wellness.  Follow your health care provider's instructions about healthy diet, exercising, and getting tested or screened for diseases.  Follow your health care provider's instructions on monitoring your cholesterol and blood pressure.  This information is not intended to replace advice given to you by your health care provider. Make sure you discuss any questions you have with your health care provider.  Document Revised: 11/10/2020 Document Reviewed: 11/10/2020  Elsevier Patient Education  2024 [employer].

## 2024-01-13 NOTE — Progress Notes (Signed)
 Office Note 01/13/2024  CC:  Chief Complaint  Patient presents with   Annual Exam    Pt is fasting   HPI:  Patient is a 63 y.o. male who is here for annual health maintenance exam and f/u HTN. Since I last saw him he had an extensive cardiovascular evaluation for murmur, results showed moderate aortic stenosis, aneurysm look dilatation of the ascending aorta, 47 mm. CT angio chest aorta showed a 5.1 cm ascending thoracic aortic aneurysm. Cath showed mild nonobstructive coronary artery disease.  He is set up to get the Bentall procedure 02/17/2024 by Dr. Lucas.  He currently feels well, asymptomatic.  Bps have been <110/60s regularly at home. Taking 1/2 of 5mg  lisinopril  daily.  Past Medical History:  Diagnosis Date   Aortic stenosis    mod/sev on echo 11/2023   Complication of anesthesia    during a colonoscopy pt. woke up during the procedure and also during a hand surgery    Duodenal ulcer 1983   GERD (gastroesophageal reflux disease)    Hay fever    History of adenomatous polyp of colon    2016 (Dr. Celestia, Margarete)   History of blood product transfusion    Hypercholesterolemia    2024, Framingham risk 8%   Hypertension    IFG (impaired fasting glucose)    11/2022 fasting glucose 117.  Hemoglobin A1c 5.1%.   Primary osteoarthritis of knees, bilateral    Shingles    Right lateral hip extending down over right thigh and knee, initial episode 04/2021, recurrence x1 after.  No postherpetic neuralgia.   Thoracic aortic aneurysm (HCC)    4.7cm may 2025    Past Surgical History:  Procedure Laterality Date   ANTERIOR CERVICAL DECOMP/DISCECTOMY FUSION N/A 06/30/2017   Procedure: ANTERIOR CERVICAL DECOMPRESSION/DISCECTOMY FUSION C5-6;  Surgeon: Burnetta Aures, MD;  Location: [facility] OR;  Service: Orthopedics;  Laterality: N/A;  3 hrs   COLONOSCOPY  [id number]   09/02/14 polyps x 3 ([facility])   GANGLION CYST EXCISION Left 2006   HEEL SPUR SURGERY Right 2013   HEEL SPUR SURGERY  Right    2nd surgery   INGUINAL HERNIA REPAIR Left 04/26/2014   Procedure: LEFT INGUINAL HERNIA REPAIR WITH MESH;  Surgeon: Vicenta Poli, MD;  Location: [facility] OR;  Service: General;  Laterality: Left;   INGUINAL HERNIA REPAIR Right 02/20/2016   Procedure: RIGHT INGUINAL HERNIA REPAIR WITH MESH;  Surgeon: Vicenta Poli, MD;  [facility] OR;  Service: General;  Laterality: Right;   INSERTION OF MESH Left 04/26/2014   Procedure: INSERTION OF MESH;  Surgeon: Vicenta Poli, MD;  Location: [facility] OR;  Service: General;  Laterality: Left;   INSERTION OF MESH Right 02/20/2016   Procedure: INSERTION OF MESH;  Surgeon: Vicenta Poli, MD;  Location: [facility] OR;  Service: General;  Laterality: Right;   KNEE ARTHROSCOPY Left 02/2015   left   KNEE ARTHROSCOPY Left 12/2015   KNEE ARTHROSCOPY Right    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   RIGHT/LEFT HEART CATH AND CORONARY ANGIOGRAPHY N/A 12/28/2023   Procedure: RIGHT/LEFT HEART CATH AND CORONARY ANGIOGRAPHY;  Surgeon: Wendel Lurena POUR, MD;  Location: [facility] INVASIVE CV LAB;  Service: Cardiovascular;  Laterality: N/A;   TOTAL KNEE REVISION Right 11/18/2022   Procedure: TOTAL KNEE REVISION FEMORAL COMPONENT ONLY;  Surgeon: Ernie Cough, MD;  Location: [facility] ORS;  Service: Orthopedics;  Laterality: Right;   WRIST SURGERY      Family History  Problem Relation Age of Onset   Scoliosis Mother  Healthy Brother     Social History   Socioeconomic History   Marital status: Married    Spouse name: Not on file   Number of children: Not on file   Years of education: Not on file   Highest education level: 12th grade  Occupational History   Not on file  Tobacco Use   Smoking status: Former    Current packs/day: 0.00    Types: Cigarettes    Start date: 43    Quit date: 1989    Years since quitting: 36.5    Passive exposure: Never   Smokeless tobacco: Never   Tobacco comments:    quit 1989  Vaping Use   Vaping status: Never Used  Substance and  Sexual Activity   Alcohol use: Yes    Alcohol/week: 6.0 - 12.0 standard drinks of alcohol    Types: 6 - 12 Cans of beer per week    Comment: occasional   Drug use: No   Sexual activity: Not on file  Other Topics Concern   Not on file  Social History Narrative   Married,   Educ: HS   Occup: sales   Social Drivers of Corporate investment banker Strain: Low Risk  (01/09/2024)   Overall Financial Resource Strain (CARDIA)    Difficulty of Paying Living Expenses: Not hard at all  Food Insecurity: No Food Insecurity (01/09/2024)   Hunger Vital Sign    Worried About Running Out of Food in the Last Year: Never true    Ran Out of Food in the Last Year: Never true  Transportation Needs: No Transportation Needs (01/09/2024)   PRAPARE - Administrator, Civil Service (Medical): No    Lack of Transportation (Non-Medical): No  Physical Activity: Insufficiently Active (01/09/2024)   Exercise Vital Sign    Days of Exercise per Week: 5 days    Minutes of Exercise per Session: 20 min  Stress: No Stress Concern Present (01/09/2024)   [employer] of Occupational Health - Occupational Stress Questionnaire    Feeling of Stress: Only a little  Social Connections: Moderately Integrated (01/09/2024)   Social Connection and Isolation Panel    Frequency of Communication with Friends and Family: More than three times a week    Frequency of Social Gatherings with Friends and Family: Once a week    Attends Religious Services: More than 4 times per year    Active Member of [employer] or Organizations: No    Attends Banker Meetings: Not on file    Marital Status: Married  Catering manager Violence: Not At Risk (11/18/2022)   Humiliation, Afraid, Rape, and Kick questionnaire    Fear of Current or Ex-Partner: No    Emotionally Abused: No    Physically Abused: No    Sexually Abused: No    Outpatient Medications Prior to Visit  Medication Sig Dispense Refill   aspirin  EC (ASPIRIN  LOW DOSE)  81 MG tablet Take 1 tablet (81 mg total) by mouth daily. Swallow whole. 30 tablet 1   ketorolac  (ACULAR ) 0.5 % ophthalmic solution Place 1 drop into both eyes daily as needed. 5 mL 0   lisinopril  (ZESTRIL ) 5 MG tablet Take 1 tablet (5 mg total) by mouth daily. (Patient taking differently: Take 2.5 mg by mouth daily.) 30 tablet 0   Multiple Vitamins-Minerals (MULTIVITAMIN PO) Take 1 tablet by mouth daily.      XIIDRA 5 % SOLN Apply 1 drop to eye 2 (two) times daily. (Patient  not taking: Reported on 01/13/2024)     No facility-administered medications prior to visit.    Allergies  Allergen Reactions   Aspirin  Other (See Comments)    High doses contraindicated due to ulcer   Codeine Nausea And Vomiting   Oxycodone -Acetaminophen  Nausea And Vomiting    Review of Systems  Constitutional:  Negative for appetite change, chills, fatigue and fever.  HENT:  Negative for congestion, dental problem, ear pain and sore throat.   Eyes:  Negative for discharge, redness and visual disturbance.  Respiratory:  Negative for cough, chest tightness, shortness of breath and wheezing.   Cardiovascular:  Negative for chest pain, palpitations and leg swelling.  Gastrointestinal:  Negative for abdominal pain, blood in stool, diarrhea, nausea and vomiting.  Genitourinary:  Negative for difficulty urinating, dysuria, flank pain, frequency, hematuria and urgency.  Musculoskeletal:  Negative for arthralgias, back pain, joint swelling, myalgias and neck stiffness.  Skin:  Negative for pallor and rash.  Neurological:  Negative for dizziness, speech difficulty, weakness and headaches.  Hematological:  Negative for adenopathy. Does not bruise/bleed easily.  Psychiatric/Behavioral:  Negative for confusion and sleep disturbance. The patient is not nervous/anxious.    PE;    01/13/2024    8:31 AM 12/28/2023    2:15 PM 12/28/2023    2:00 PM  Vitals with BMI  Height 5' 11    Weight 182 lbs 13 oz    BMI 25.51     Systolic 117 101 895  Diastolic 77 64 75  Pulse 69 81 82  Gen: Alert, well appearing.  Patient is oriented to person, place, time, and situation. AFFECT: pleasant, lucid thought and speech. ENT: Ears: EACs clear, normal epithelium.  TMs with good light reflex and landmarks bilaterally.  Eyes: no injection, icteris, swelling, or exudate.  EOMI, PERRLA. Nose: no drainage or turbinate edema/swelling.  No injection or focal lesion.  Mouth: lips without lesion/swelling.  Oral mucosa pink and moist.  Dentition intact and without obvious caries or gingival swelling.  Oropharynx without erythema, exudate, or swelling.  Neck: supple/nontender.  No LAD, mass, or TM.  Carotid pulses 2+ bilaterally, without bruits. CV: RRR, 2/6 syst murmur RUSB, no diastolic murmur, no r/g.   LUNGS: CTA bilat, nonlabored resps, good aeration in all lung fields. ABD: soft, NT, ND, BS normal.  No hepatospenomegaly or mass.  No bruits. EXT: no clubbing, cyanosis, or edema.  Musculoskeletal: no joint swelling, erythema, warmth, or tenderness.  ROM of all joints intact. Skin - no sores or suspicious lesions or rashes or color changes  Pertinent labs:  No results found for: TSH Lab Results  Component Value Date   WBC 6.3 12/26/2023   HGB 11.9 (L) 12/28/2023   HCT 35.0 (L) 12/28/2023   MCV 92 12/26/2023   PLT 147 (L) 12/26/2023   Lab Results  Component Value Date   CREATININE 0.99 12/26/2023   BUN 15 12/26/2023   NA 139 12/28/2023   K 3.9 12/28/2023   CL 103 12/26/2023   CO2 21 12/26/2023   Lab Results  Component Value Date   ALT 16 09/07/2023   AST 20 09/07/2023   ALKPHOS 76 09/07/2023   BILITOT 0.7 09/07/2023   Lab Results  Component Value Date   CHOL 145 09/07/2023   Lab Results  Component Value Date   HDL 43.90 09/07/2023   Lab Results  Component Value Date   LDLCALC 83 09/07/2023   Lab Results  Component Value Date   TRIG 91.0 09/07/2023  Lab Results  Component Value Date   CHOLHDL 3  09/07/2023   Lab Results  Component Value Date   PSA 0.86 01/12/2023   PSA 0.60 12/25/2021   Lab Results  Component Value Date   HGBA1C 5.1 01/13/2023   ASSESSMENT AND PLAN:   No problem-specific Assessment & Plan notes found for this encounter.  #1 health maintenance exam: Reviewed age and gender appropriate health maintenance issues (prudent diet, regular exercise, health risks of tobacco and excessive alcohol, use of seatbelts, fire alarms in home, use of sunscreen).  Also reviewed age and gender appropriate health screening as well as vaccine recommendations. Vaccines: Prevnar 20 -->declined.  Otherwise ALL UTD Labs: CMET, hemoglobin A1c (IFG), psa.  Lipids and hepatic panel are up-to-date. Prostate ca screening: psa Colon ca screening: History of adenomatous colon polyp.  Last colonoscopy was approximately 5 years ago with [facility]. Patient would like referral to different gastroenterology office but prefers to defer this for now b/c of the more pressing issue of heart surgery coming up.  #2 hypertension, well-controlled on one half of a 5 mg lisinopril  tab daily. Electrolytes and kidney function today.  3.  Aortic stenosis and ascending aortic aneurysm. He is scheduled for Bentall procedure with Dr. Lucas on 02/17/2024.   An After Visit Summary was printed and given to the patient.  FOLLOW UP:  Return in about 6 months (around 07/15/2024) for routine chronic illness f/u.  Signed:  Gerlene Hockey, MD           01/13/2024

## 2024-01-15 ENCOUNTER — Ambulatory Visit: Payer: Self-pay | Admitting: Family Medicine

## 2024-01-31 ENCOUNTER — Other Ambulatory Visit: Payer: Self-pay

## 2024-01-31 ENCOUNTER — Other Ambulatory Visit (HOSPITAL_COMMUNITY): Payer: Self-pay

## 2024-01-31 DIAGNOSIS — B029 Zoster without complications: Secondary | ICD-10-CM

## 2024-01-31 MED ORDER — VALACYCLOVIR HCL 1 G PO TABS
1000.0000 mg | ORAL_TABLET | Freq: Two times a day (BID) | ORAL | 0 refills | Status: AC
  Filled 2024-01-31: qty 28, 14d supply, fill #0

## 2024-01-31 NOTE — Telephone Encounter (Signed)
 No further action needed.

## 2024-01-31 NOTE — Telephone Encounter (Signed)
Valtrex prescription sent

## 2024-01-31 NOTE — Telephone Encounter (Signed)
 CRM # [id number] Owner: Seward Magnolia BROCKS, RN Status: Unresolved Open  Priority: Routine Created on: 01/31/2024 08:55 AM By: Gail Henretta POUR   Primary Information  Source  Chase Lowe (Patient)   Subject  Chase Lowe (Patient)   Topic  Clinical - Medication Question    Communication  Reason for CRM: Patient would like to know if the valACYclovir  (VALTREX ) 1000 MG tablet can be prescribed before he has surgery on August 15th as he usually has shingles outbreaks before surgery. Patient usually starts it a week before surgery.    Please refill, if appropriate. Rx pending,

## 2024-02-03 ENCOUNTER — Other Ambulatory Visit: Payer: Self-pay

## 2024-02-04 ENCOUNTER — Other Ambulatory Visit: Payer: Self-pay | Admitting: Family Medicine

## 2024-02-06 ENCOUNTER — Telehealth: Admitting: Physician Assistant

## 2024-02-06 DIAGNOSIS — H00012 Hordeolum externum right lower eyelid: Secondary | ICD-10-CM

## 2024-02-06 MED ORDER — ERYTHROMYCIN 5 MG/GM OP OINT: 1.0000 | TOPICAL_OINTMENT | Freq: Every day | OPHTHALMIC | 0 refills | Status: AC

## 2024-02-06 NOTE — Progress Notes (Signed)
  E-Visit for Stye   We are sorry that you are not feeling well. Here is how we plan to help!  Based on what you have shared with me it looks like you have a stye.  A stye is an inflammation of the eyelid.  It is often a red, painful lump near the edge of the eyelid that may look like a boil or a pimple.  A stye develops when an infection occurs at the base of an eyelash.   We have made appropriate suggestions for you based upon your presentation: Simple styes can be treated without medical intervention.  Most styes either resolve spontaneously or resolve with simple home treatment by applying warm compresses or heated washcloth to the stye for about 10-15 minutes three to four times a day. This causes the stye to drain and resolve. and I have prescribed Erythromycin Ophthalmic ointment 0.5% Apply topically in affected eye daily at bedtime for 5 days. To apply: Tilt the head back and, pressing your finger gently on the skin just beneath the lower eyelid, pull the lower eyelid away from the eye to make a space. Squeeze a thin strip of ointment into this space. A 1-cm (approximately 1/3-inch) strip of ointment is usually enough, unless you have been told by your doctor to use a different amount. Let go of the eyelid and gently close the eyes. Keep the eyes closed for 1 or 2 minutes to allow the medicine to come into contact with the infection.      HOME CARE:  Wash your hands often! Let the stye open on its own. Don't squeeze or open it. Don't rub your eyes. This can irritate your eyes and let in bacteria.  If you need to touch your eyes, wash your hands first. Don't wear eye makeup or contact lenses until the area has healed.  GET HELP RIGHT AWAY IF:  Your symptoms do not improve. You develop blurred or loss of vision. Your symptoms worsen (increased discharge, pain or redness).   Thank you for choosing an e-visit.  Your e-visit answers were reviewed by a board certified advanced clinical  practitioner to complete your personal care plan. Depending upon the condition, your plan could have included both over the counter or prescription medications.  Please review your pharmacy choice. Make sure the pharmacy is open so you can pick up prescription now. If there is a problem, you may contact your provider through [employer] and have the prescription routed to another pharmacy.  Your safety is important to us . If you have drug allergies check your prescription carefully.   For the next 24 hours you can use MyChart to ask questions about today's visit, request a non-urgent call back, or ask for a work or school excuse. You will get an email in the next two days asking about your experience. I hope that your e-visit has been valuable and will speed your recovery.    I have spent 5 minutes in review of e-visit questionnaire, review and updating patient chart, medical decision making and response to patient.   Delon CHRISTELLA Dickinson, PA-C

## 2024-02-13 NOTE — Progress Notes (Signed)
 Surgical Instructions   Your procedure is scheduled on Friday, August 15th. Report to [facility] Main Entrance A at 5:30 A.M., then check in with the Admitting office. Any questions or running late day of surgery: call [phone]  Questions prior to your surgery date: call [phone], Monday-Friday, 8am-4pm. If you experience any cold or flu symptoms such as cough, fever, chills, shortness of breath, etc. between now and your scheduled surgery, please notify us  at the above number.     Remember:  Do not eat or drink after midnight the night before your surgery   Take these medicines the morning of surgery with A SIP OF WATER  valACYclovir  (VALTREX )    May take these medicines IF NEEDED: NONE  Hold your Aspirin  for the day of surgery.  Last dose should be taken on Thursday, 02-16-24.     One week prior to surgery, STOP taking any Aleve, Naproxen, Ibuprofen, Motrin, Advil, Goody's, BC's, all herbal medications, fish oil, and non-prescription vitamins.                     Do NOT Smoke (Tobacco/Vaping) for 24 hours prior to your procedure.  If you use a CPAP at night, you may bring your mask/headgear for your overnight stay.   You will be asked to remove any contacts, glasses, piercing's, hearing aid's, dentures/partials prior to surgery. Please bring cases for these items if needed.    Patients discharged the day of surgery will not be allowed to drive home, and someone needs to stay with them for 24 hours.  SURGICAL WAITING ROOM VISITATION Patients may have no more than 2 support people in the waiting area - these visitors may rotate.   Pre-op nurse will coordinate an appropriate time for 1 ADULT support person, who may not rotate, to accompany patient in pre-op.  Children under the age of 38 must have an adult with them who is not the patient and must remain in the main waiting area with an adult.  If the patient needs to stay at the hospital during part of their recovery,  the visitor guidelines for inpatient rooms apply.  Please refer to the [facility] website for the visitor guidelines for any additional information.   If you received a COVID test during your pre-op visit  it is requested that you wear a mask when out in public, stay away from anyone that may not be feeling well and notify your surgeon if you develop symptoms. If you have been in contact with anyone that has tested positive in the last 10 days please notify you surgeon.      Pre-operative CHG Bathing Instructions   You can play a key role in reducing the risk of infection after surgery. Your skin needs to be as free of germs as possible. You can reduce the number of germs on your skin by washing with CHG (chlorhexidine  gluconate) soap before surgery. CHG is an antiseptic soap that kills germs and continues to kill germs even after washing.   DO NOT use if you have an allergy to chlorhexidine /CHG or antibacterial soaps. If your skin becomes reddened or irritated, stop using the CHG and notify one of our RNs at [phone].              TAKE A SHOWER THE NIGHT BEFORE SURGERY AND THE DAY OF SURGERY    Please keep in mind the following:  DO NOT shave, including legs and underarms, 48 hours prior to surgery.  You may shave your face before/day of surgery.  Place clean sheets on your bed the night before surgery Use a clean washcloth (not used since being washed) for each shower. DO NOT sleep with pet's night before surgery.  CHG Shower Instructions:  Wash your face and private area with normal soap. If you choose to wash your hair, wash first with your normal shampoo.  After you use shampoo/soap, rinse your hair and body thoroughly to remove shampoo/soap residue.  Turn the water OFF and apply half the bottle of CHG soap to a CLEAN washcloth.  Apply CHG soap ONLY FROM YOUR NECK DOWN TO YOUR TOES (washing for 3-5 minutes)  DO NOT use CHG soap on face, private areas, open wounds, or sores.   Pay special attention to the area where your surgery is being performed.  If you are having back surgery, having someone wash your back for you may be helpful. Wait 2 minutes after CHG soap is applied, then you may rinse off the CHG soap.  Pat dry with a clean towel  Put on clean pajamas    Additional instructions for the day of surgery: DO NOT APPLY any lotions, deodorants, cologne, or perfumes.   Do not wear jewelry or makeup Do not wear nail polish, gel polish, artificial nails, or any other type of covering on natural nails (fingers and toes) Do not bring valuables to the hospital. [facility] is not responsible for valuables/personal belongings. Put on clean/comfortable clothes.  Please brush your teeth.  Ask your nurse before applying any prescription medications to the skin.

## 2024-02-14 ENCOUNTER — Encounter (HOSPITAL_COMMUNITY)
Admission: RE | Admit: 2024-02-14 | Discharge: 2024-02-14 | Disposition: A | Discharge status: Home / Self Care | Source: Ambulatory Visit | Attending: Surgery | Admitting: Surgery

## 2024-02-14 ENCOUNTER — Other Ambulatory Visit: Payer: Self-pay

## 2024-02-14 ENCOUNTER — Encounter (HOSPITAL_COMMUNITY): Payer: Self-pay

## 2024-02-14 ENCOUNTER — Inpatient Hospital Stay (HOSPITAL_BASED_OUTPATIENT_CLINIC_OR_DEPARTMENT_OTHER)
Admission: RE | Admit: 2024-02-14 | Discharge: 2024-02-14 | Disposition: A | Discharge status: Home / Self Care | Source: Ambulatory Visit | Attending: Family Medicine | Admitting: Family Medicine

## 2024-02-14 ENCOUNTER — Ambulatory Visit (HOSPITAL_COMMUNITY)
Admission: RE | Admit: 2024-02-14 | Discharge: 2024-02-14 | Disposition: A | Discharge status: Home / Self Care | Source: Ambulatory Visit | Attending: Surgery | Admitting: Surgery

## 2024-02-14 VITALS — BP 132/89 | HR 66 | Temp 97.8°F | Resp 18 | Ht 71.0 in | Wt 180.0 lb

## 2024-02-14 DIAGNOSIS — I2510 Atherosclerotic heart disease of native coronary artery without angina pectoris: Secondary | ICD-10-CM | POA: Diagnosis not present

## 2024-02-14 DIAGNOSIS — I771 Stricture of artery: Secondary | ICD-10-CM | POA: Diagnosis not present

## 2024-02-14 DIAGNOSIS — Z87891 Personal history of nicotine dependence: Secondary | ICD-10-CM | POA: Diagnosis not present

## 2024-02-14 DIAGNOSIS — I7121 Aneurysm of the ascending aorta, without rupture: Secondary | ICD-10-CM

## 2024-02-14 DIAGNOSIS — Z79899 Other long term (current) drug therapy: Secondary | ICD-10-CM | POA: Diagnosis not present

## 2024-02-14 DIAGNOSIS — I350 Nonrheumatic aortic (valve) stenosis: Secondary | ICD-10-CM | POA: Diagnosis not present

## 2024-02-14 DIAGNOSIS — Q2381 Bicuspid aortic valve: Secondary | ICD-10-CM | POA: Diagnosis not present

## 2024-02-14 DIAGNOSIS — I10 Essential (primary) hypertension: Secondary | ICD-10-CM | POA: Diagnosis not present

## 2024-02-14 DIAGNOSIS — Z01818 Encounter for other preprocedural examination: Secondary | ICD-10-CM | POA: Insufficient documentation

## 2024-02-14 DIAGNOSIS — Z96653 Presence of artificial knee joint, bilateral: Secondary | ICD-10-CM | POA: Diagnosis not present

## 2024-02-14 DIAGNOSIS — Z7982 Long term (current) use of aspirin: Secondary | ICD-10-CM | POA: Diagnosis not present

## 2024-02-14 DIAGNOSIS — E7800 Pure hypercholesterolemia, unspecified: Secondary | ICD-10-CM | POA: Diagnosis not present

## 2024-02-14 HISTORY — DX: Cardiac murmur, unspecified: R01.1

## 2024-02-14 LAB — URINALYSIS, ROUTINE W REFLEX MICROSCOPIC
Bilirubin Urine: NEGATIVE
Glucose, UA: NEGATIVE mg/dL
Hgb urine dipstick: NEGATIVE
Ketones, ur: NEGATIVE mg/dL
Leukocytes,Ua: NEGATIVE
Nitrite: NEGATIVE
Protein, ur: NEGATIVE mg/dL
Specific Gravity, Urine: 1.006 (ref 1.005–1.030)
pH: 6 (ref 5.0–8.0)

## 2024-02-14 LAB — SURGICAL PCR SCREEN
MRSA, PCR: NEGATIVE
Staphylococcus aureus: NEGATIVE

## 2024-02-14 LAB — COMPREHENSIVE METABOLIC PANEL WITH GFR
ALT: 21 U/L (ref 0–44)
AST: 22 U/L (ref 15–41)
Albumin: 4.3 g/dL (ref 3.5–5.0)
Alkaline Phosphatase: 89 U/L (ref 38–126)
Anion gap: 10 (ref 5–15)
BUN: 12 mg/dL (ref 8–23)
CO2: 25 mmol/L (ref 22–32)
Calcium: 10 mg/dL (ref 8.9–10.3)
Chloride: 102 mmol/L (ref 98–111)
Creatinine, Ser: 1.04 mg/dL (ref 0.61–1.24)
GFR, Estimated: >60 mL/min (ref >60)
Glucose, Bld: 96 mg/dL (ref 70–99)
Potassium: 4.7 mmol/L (ref 3.5–5.1)
Sodium: 137 mmol/L (ref 135–145)
Total Bilirubin: 0.7 mg/dL (ref 0.0–1.2)
Total Protein: 7.3 g/dL (ref 6.5–8.1)

## 2024-02-14 LAB — TYPE AND SCREEN
ABO/RH(D): O POS
Antibody Screen: NEGATIVE

## 2024-02-14 LAB — CBC
HCT: 41.4 % (ref 39.0–52.0)
Hemoglobin: 13.8 g/dL (ref 13.0–17.0)
MCH: 29.9 pg (ref 26.0–34.0)
MCHC: 33.3 g/dL (ref 30.0–36.0)
MCV: 89.8 fL (ref 80.0–100.0)
Platelets: 124 K/uL — ABNORMAL LOW (ref 150–400)
RBC: 4.61 MIL/uL (ref 4.22–5.81)
RDW: 12.9 % (ref 11.5–15.5)
WBC: 6.1 K/uL (ref 4.0–10.5)
nRBC: 0 % (ref 0.0–0.2)

## 2024-02-14 LAB — HEMOGLOBIN A1C
Hgb A1c MFr Bld: 5.5 % (ref 4.8–5.6)
Mean Plasma Glucose: 111 mg/dL

## 2024-02-14 LAB — PROTIME-INR
INR: 1 (ref 0.8–1.2)
Prothrombin Time: 13.9 s (ref 11.4–15.2)

## 2024-02-14 LAB — APTT: aPTT: 30 s (ref 24–36)

## 2024-02-14 NOTE — Progress Notes (Addendum)
 PCP - Dr. Aleene Hockey Cardiologist - Dr. Lurena Red  PPM/ICD - denies   Chest x-ray - 02/14/24 EKG - 02/14/24 Stress Test - denies ECHO - 11/04/23 Cardiac Cath - 12/28/23  Sleep Study - denies   DM- denies  Last dose of GLP1 agonist-  n/a   Blood Thinner Instructions: n/a Aspirin  Instructions: Stop ASA 1 week prior. Last dose 02/10/24  ERAS Protcol - no NPO   COVID TEST- n/a   Anesthesia review: yes, cardiac hx  Patient denies shortness of breath, fever, cough and chest pain at PAT appointment   All instructions explained to the patient, with a verbal understanding of the material. Patient agrees to go over the instructions while at home for a better understanding.  The opportunity to ask questions was provided.

## 2024-02-14 NOTE — Progress Notes (Signed)
 Surgical Instructions   Your procedure is scheduled on Friday, August 15th. Report to [facility] Main Entrance A at 5:30 A.M., then check in with the Admitting office. Any questions or running late day of surgery: call [phone]  Questions prior to your surgery date: call [phone], Monday-Friday, 8am-4pm. If you experience any cold or flu symptoms such as cough, fever, chills, shortness of breath, etc. between now and your scheduled surgery, please notify us  at the above number.     Remember:  Do not eat or drink after midnight the night before your surgery   Take these medicines the morning of surgery with A SIP OF WATER  valACYclovir  (VALTREX )    May take these medicines IF NEEDED: NONE  **STOP ASA 1 WEEK PRIOR TO SURGERY**    One week prior to surgery, STOP taking any Aleve, Naproxen, Ibuprofen, Motrin, Advil, Goody's, BC's, all herbal medications, fish oil, and non-prescription vitamins.                     Do NOT Smoke (Tobacco/Vaping) for 24 hours prior to your procedure.  If you use a CPAP at night, you may bring your mask/headgear for your overnight stay.   You will be asked to remove any contacts, glasses, piercing's, hearing aid's, dentures/partials prior to surgery. Please bring cases for these items if needed.    Patients discharged the day of surgery will not be allowed to drive home, and someone needs to stay with them for 24 hours.  SURGICAL WAITING ROOM VISITATION Patients may have no more than 2 support people in the waiting area - these visitors may rotate.   Pre-op nurse will coordinate an appropriate time for 1 ADULT support person, who may not rotate, to accompany patient in pre-op.  Children under the age of 103 must have an adult with them who is not the patient and must remain in the main waiting area with an adult.  If the patient needs to stay at the hospital during part of their recovery, the visitor guidelines for inpatient rooms  apply.  Please refer to the [facility] website for the visitor guidelines for any additional information.   If you received a COVID test during your pre-op visit  it is requested that you wear a mask when out in public, stay away from anyone that may not be feeling well and notify your surgeon if you develop symptoms. If you have been in contact with anyone that has tested positive in the last 10 days please notify you surgeon.      Pre-operative CHG Bathing Instructions   You can play a key role in reducing the risk of infection after surgery. Your skin needs to be as free of germs as possible. You can reduce the number of germs on your skin by washing with CHG (chlorhexidine  gluconate) soap before surgery. CHG is an antiseptic soap that kills germs and continues to kill germs even after washing.   DO NOT use if you have an allergy to chlorhexidine /CHG or antibacterial soaps. If your skin becomes reddened or irritated, stop using the CHG and notify one of our RNs at [phone].              TAKE A SHOWER THE NIGHT BEFORE SURGERY AND THE DAY OF SURGERY    Please keep in mind the following:  DO NOT shave, including legs and underarms, 48 hours prior to surgery.   You may shave your face before/day of surgery.  Place clean sheets on your bed the night before surgery Use a clean washcloth (not used since being washed) for each shower. DO NOT sleep with pet's night before surgery.  CHG Shower Instructions:  Wash your face and private area with normal soap. If you choose to wash your hair, wash first with your normal shampoo.  After you use shampoo/soap, rinse your hair and body thoroughly to remove shampoo/soap residue.  Turn the water OFF and apply half the bottle of CHG soap to a CLEAN washcloth.  Apply CHG soap ONLY FROM YOUR NECK DOWN TO YOUR TOES (washing for 3-5 minutes)  DO NOT use CHG soap on face, private areas, open wounds, or sores.  Pay special attention to the area where  your surgery is being performed.  If you are having back surgery, having someone wash your back for you may be helpful. Wait 2 minutes after CHG soap is applied, then you may rinse off the CHG soap.  Pat dry with a clean towel  Put on clean pajamas    Additional instructions for the day of surgery: DO NOT APPLY any lotions, deodorants, cologne, or perfumes.   Do not wear jewelry or makeup Do not wear nail polish, gel polish, artificial nails, or any other type of covering on natural nails (fingers and toes) Do not bring valuables to the hospital. [facility] is not responsible for valuables/personal belongings. Put on clean/comfortable clothes.  Please brush your teeth.  Ask your nurse before applying any prescription medications to the skin.

## 2024-02-15 ENCOUNTER — Other Ambulatory Visit (HOSPITAL_COMMUNITY)

## 2024-02-15 NOTE — Anesthesia Preprocedure Evaluation (Addendum)
 Anesthesia Evaluation  Patient identified by MRN, date of birth, ID band Patient awake    Reviewed: Allergy & Precautions, H&P , NPO status , Patient's Chart, lab work & pertinent test results  History of Anesthesia Complications (+) PONV and history of anesthetic complications  Airway Mallampati: III  TM Distance: >3 FB Neck ROM: Full    Dental no notable dental hx. (+) Teeth Intact, Dental Advisory Given   Pulmonary neg pulmonary ROS, former smoker   Pulmonary exam normal breath sounds clear to auscultation       Cardiovascular Exercise Tolerance: Good hypertension, Pt. on medications + Valvular Problems/Murmurs MR and AS  Rhythm:Regular Rate:Normal     Neuro/Psych negative neurological ROS  negative psych ROS   GI/Hepatic Neg liver ROS, PUD,GERD  ,,  Endo/Other  negative endocrine ROS    Renal/GU negative Renal ROS  negative genitourinary   Musculoskeletal  (+) Arthritis , Osteoarthritis,    Abdominal   Peds  Hematology negative hematology ROS (+)   Anesthesia Other Findings   Reproductive/Obstetrics negative OB ROS                              Anesthesia Physical Anesthesia Plan  ASA: 4  Anesthesia Plan: General   Post-op Pain Management: Tylenol  PO (pre-op)*   Induction: Intravenous  PONV Risk Score and Plan: 3 and Midazolam , Ondansetron  and Treatment may vary due to age or medical condition  Airway Management Planned: Oral ETT  Additional Equipment: Arterial line, CVP, PA Cath, TEE, 3D TEE and Ultrasound Guidance Line Placement  Intra-op Plan:   Post-operative Plan: Post-operative intubation/ventilation  Informed Consent: I have reviewed the patients History and Physical, chart, labs and discussed the procedure including the risks, benefits and alternatives for the proposed anesthesia with the patient or authorized representative who has indicated his/her  understanding and acceptance.     Dental advisory given  Plan Discussed with: CRNA  Anesthesia Plan Comments: (History of difficult airway.  Glide scope used electively 06/30/2017.  Per anesthesia intubation note, Difficulty was anticipated, Difficult Airway- due to reduced neck mobility, Difficult Airway- due to anterior larynx and Difficult Airway- due to limited oral opening.)         Anesthesia Quick Evaluation

## 2024-02-16 ENCOUNTER — Other Ambulatory Visit (HOSPITAL_BASED_OUTPATIENT_CLINIC_OR_DEPARTMENT_OTHER): Payer: Self-pay

## 2024-02-16 ENCOUNTER — Other Ambulatory Visit (HOSPITAL_COMMUNITY): Payer: Self-pay

## 2024-02-16 ENCOUNTER — Encounter (HOSPITAL_COMMUNITY): Payer: Self-pay

## 2024-02-16 ENCOUNTER — Other Ambulatory Visit: Payer: Self-pay

## 2024-02-16 MED ORDER — EPINEPHRINE HCL 5 MG/250ML IV SOLN IN NS
0.0000 ug/min | INTRAVENOUS | Status: DC
  Filled 2024-02-16: qty 250

## 2024-02-16 MED ORDER — LISINOPRIL 5 MG PO TABS
5.0000 mg | ORAL_TABLET | Freq: Every day | ORAL | 1 refills | Status: DC
  Filled 2024-02-16: qty 90, 90d supply, fill #0

## 2024-02-16 MED ORDER — PHENYLEPHRINE HCL-NACL 20-0.9 MG/250ML-% IV SOLN
30.0000 ug/min | INTRAVENOUS | Status: AC
  Administered 2024-02-17: 25 ug/min via INTRAVENOUS
  Filled 2024-02-16: qty 250

## 2024-02-16 MED ORDER — MILRINONE LACTATE IN DEXTROSE 20-5 MG/100ML-% IV SOLN
0.3000 ug/kg/min | INTRAVENOUS | Status: DC
  Filled 2024-02-16: qty 100

## 2024-02-16 MED ORDER — HEPARIN 30,000 UNITS/1000 ML (OHS) CELLSAVER SOLUTION
Status: DC
  Filled 2024-02-16: qty 1000

## 2024-02-16 MED ORDER — NITROGLYCERIN IN D5W 200-5 MCG/ML-% IV SOLN
2.0000 ug/min | INTRAVENOUS | Status: DC
  Filled 2024-02-16: qty 250

## 2024-02-16 MED ORDER — PLASMA-LYTE A IV SOLN
INTRAVENOUS | Status: DC
  Filled 2024-02-16: qty 2.5

## 2024-02-16 MED ORDER — TRANEXAMIC ACID (OHS) PUMP PRIME SOLUTION
2.0000 mg/kg | INTRAVENOUS | Status: DC
  Filled 2024-02-16: qty 1.63

## 2024-02-16 MED ORDER — TRANEXAMIC ACID (OHS) BOLUS VIA INFUSION
15.0000 mg/kg | INTRAVENOUS | Status: AC
  Administered 2024-02-17: 1224 mg via INTRAVENOUS
  Filled 2024-02-16: qty 1224

## 2024-02-16 MED ORDER — TRANEXAMIC ACID 1000 MG/10ML IV SOLN
1.5000 mg/kg/h | INTRAVENOUS | Status: AC
  Administered 2024-02-17: 1.5 mg/kg/h via INTRAVENOUS
  Filled 2024-02-16: qty 25

## 2024-02-16 MED ORDER — DEXMEDETOMIDINE HCL IN NACL 400 MCG/100ML IV SOLN
0.1000 ug/kg/h | INTRAVENOUS | Status: AC
  Administered 2024-02-17: .7 ug/kg/h via INTRAVENOUS
  Filled 2024-02-16: qty 100

## 2024-02-16 MED ORDER — CEFAZOLIN SODIUM-DEXTROSE 2-4 GM/100ML-% IV SOLN
2.0000 g | INTRAVENOUS | Status: AC
  Administered 2024-02-17: 2 g via INTRAVENOUS
  Filled 2024-02-16: qty 100

## 2024-02-16 MED ORDER — NOREPINEPHRINE 4 MG/250ML-% IV SOLN
0.0000 ug/min | INTRAVENOUS | Status: AC
  Administered 2024-02-17: 2 ug/min via INTRAVENOUS
  Filled 2024-02-16: qty 250

## 2024-02-16 MED ORDER — VANCOMYCIN HCL 1250 MG/250ML IV SOLN
1250.0000 mg | INTRAVENOUS | Status: AC
  Administered 2024-02-17: 1250 mg via INTRAVENOUS
  Filled 2024-02-16: qty 250

## 2024-02-16 MED ORDER — POTASSIUM CHLORIDE 2 MEQ/ML IV SOLN
80.0000 meq | INTRAVENOUS | Status: DC
  Filled 2024-02-16: qty 40

## 2024-02-16 MED ORDER — MANNITOL 20 % IV SOLN
INTRAVENOUS | Status: DC
  Filled 2024-02-16: qty 13

## 2024-02-16 MED ORDER — INSULIN REGULAR(HUMAN) IN NACL 100-0.9 UT/100ML-% IV SOLN
INTRAVENOUS | Status: AC
  Administered 2024-02-17: .9 [IU]/h via INTRAVENOUS
  Filled 2024-02-16: qty 100

## 2024-02-16 NOTE — H&P (Signed)
 [address] [place] [address]             [phone]     Cardiothoracic Surgery Admission History and Physical   PCP is McGowen, Aleene DEL, MD Referring Provider is McGowen, Aleene DEL, MD       Chief Complaint  Patient presents with   Thoracic Aortic Aneurysm and bicuspid aortic valve  stenosis.           HPI:   The patient is a 63 year old gentleman with a history of hypertension, hyperlipidemia, and 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  He was noted to have a heart murmur and LVH on his preoperative ECG and an echocardiogram on 11/04/2023 showed a moderately calcified aortic valve with an indeterminate number of leaflets.  There is felt to be moderate aortic stenosis with a mean gradient of 25 mmHg and a peak gradient of 45.5 mmHg.  Aortic valve area by VTI was 0.71 cm.  Dimensionless index was 0.29.  Stroke-volume index was low at 26.  Left ventricular ejection fraction was 55 to 60% with mild LVH.  The aorta was noted to be dilated to 47 mm.  CT of the chest on 11/21/2023 showed fusiform aneurysmal enlargement of the ascending aorta to 5.1 cm.   He is here today with his wife.  He remains active without any symptoms.  He denies any shortness of breath or fatigue.  He has had no dizziness or syncope.  He denies orthopnea and peripheral edema.  He has had no chest  discomfort.   His father also had aortic valve disease and required replacement at a younger age.  He does not know if he had a bicuspid aortic valve disease or an aneurysm.       Past Medical History:  Diagnosis Date   Aortic stenosis      mod/sev on echo 11/2023   Complication of anesthesia      during a colonoscopy pt. woke up during the procedure and also during a hand surgery    Duodenal ulcer 1983   GERD (gastroesophageal reflux disease)     Hay fever      History of adenomatous polyp of colon      2016 (Dr. Celestia, Margarete)   History of blood product transfusion     Hypercholesterolemia      2024, Framingham risk 8%   Hypertension     IFG (impaired fasting glucose)      11/2022 fasting glucose 117.  Hemoglobin A1c 5.1%.   Primary osteoarthritis of knees, bilateral     Shingles      Right lateral hip extending down over right thigh and knee, initial episode 04/2021, recurrence x1 after.  No postherpetic neuralgia.   Thoracic aortic aneurysm (HCC)      4.7cm may 2025               Past Surgical History:  Procedure Laterality Date   ANTERIOR CERVICAL DECOMP/DISCECTOMY FUSION N/A 06/30/2017    Procedure: ANTERIOR CERVICAL DECOMPRESSION/DISCECTOMY FUSION C5-6;  Surgeon: Burnetta Aures, MD;  Location: [facility] OR;  Service: Orthopedics;  Laterality: N/A;  3 hrs   COLONOSCOPY   [id number]    09/02/14 polyps x 3 ([facility])   GANGLION CYST EXCISION Left 2006   HEEL SPUR SURGERY Right 2013   HEEL SPUR SURGERY Right  2nd surgery   INGUINAL HERNIA REPAIR Left 04/26/2014    Procedure: LEFT INGUINAL HERNIA REPAIR WITH MESH;  Surgeon: Vicenta Poli, MD;  Location: [facility] OR;  Service: General;  Laterality: Left;   INGUINAL HERNIA REPAIR Right 02/20/2016    Procedure: RIGHT INGUINAL HERNIA REPAIR WITH MESH;  Surgeon: Vicenta Poli, MD;  Location: [facility] OR;  Service: General;  Laterality: Right;   INSERTION OF MESH Left 04/26/2014    Procedure: INSERTION OF MESH;  Surgeon: Vicenta Poli, MD;  Location: [facility] OR;  Service: General;  Laterality: Left;   INSERTION OF MESH Right 02/20/2016    Procedure: INSERTION OF MESH;  Surgeon: Vicenta Poli, MD;  Location: [facility] OR;  Service: General;  Laterality: Right;   KNEE ARTHROSCOPY Left 02/2015    left   KNEE ARTHROSCOPY Left 12/2015   KNEE ARTHROSCOPY Right     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   TOTAL KNEE REVISION Right 11/18/2022    Procedure: TOTAL KNEE REVISION FEMORAL COMPONENT ONLY;  Surgeon: Ernie Cough, MD;  Location: [facility] ORS;  Service: Orthopedics;  Laterality: Right;   WRIST SURGERY                   Family History  Problem Relation Age of Onset   Scoliosis Mother     Healthy Brother            Social History Social History  Social History         Tobacco Use   Smoking status: Former      Current packs/day: 0.00      Types: Cigarettes      Start date: 1972      Quit date: 1989      Years since quitting: 36.4      Passive exposure: Never   Smokeless tobacco: Never   Tobacco comments:      quit 1989  Vaping Use   Vaping status: Never Used  Substance Use Topics   Alcohol use: Yes      Alcohol/week: 6.0 - 12.0 standard drinks of alcohol      Types: 6 - 12 Cans of beer per week      Comment: occasional   Drug use: No              Current Outpatient Medications  Medication Sig Dispense Refill   aspirin  EC 81 MG tablet Take 1 tablet (81 mg total) by mouth daily. Swallow whole. 30 tablet 0   lisinopril  (ZESTRIL ) 5 MG tablet Take 5 mg by mouth daily.       Multiple Vitamins-Minerals (MULTIVITAMIN PO) Take 1 tablet by mouth daily.           No current facility-administered medications for this visit.        Allergies       Allergies  Allergen Reactions   Aspirin  Other (See Comments)      High doses contraindicated due to ulcer   Codeine Nausea And Vomiting   Oxycodone -Acetaminophen  Nausea And Vomiting        Review of Systems  Constitutional:  Negative for activity change and fatigue.  HENT: Negative.  Negative for dental problem.   Eyes: Negative.   Respiratory:  Negative for shortness of breath.   Cardiovascular:  Negative for chest pain and leg swelling.  Gastrointestinal: Negative.   Endocrine: Negative.   Genitourinary: Negative.   Musculoskeletal:        Hx of bilateral knee arthritis s/p bilateral TKR.  Skin: Negative.  Allergic/Immunologic: Negative.   Neurological:  Negative for dizziness and syncope.  Hematological: Negative.    Psychiatric/Behavioral: Negative.        BP (!) 147/87 (BP Location: Left Arm, Patient Position: Sitting, Cuff Size: Normal)   Pulse 81   Resp 20   Ht 5' 11 (1.803 m)   Wt 182 lb 12.8 oz (82.9 kg)   SpO2 99% Comment: RA  BMI 25.50 kg/m  Physical Exam Constitutional:      Appearance: Normal appearance. He is normal weight.  HENT:     Head: Normocephalic and atraumatic.  Eyes:     Extraocular Movements: Extraocular movements intact.     Conjunctiva/sclera: Conjunctivae normal.     Pupils: Pupils are equal, round, and reactive to light.  Cardiovascular:     Rate and Rhythm: Normal rate and regular rhythm.     Pulses: Normal pulses.     Comments: 3/6 systolic murmur RSB, no diastolic murmur Pulmonary:     Effort: Pulmonary effort is normal.     Breath sounds: Normal breath sounds.  Abdominal:     General: Abdomen is flat.     Palpations: Abdomen is soft.     Tenderness: There is no abdominal tenderness.  Musculoskeletal:        General: No swelling.  Skin:    General: Skin is warm and dry.  Neurological:     General: No focal deficit present.     Mental Status: He is alert and oriented to person, place, and time.  Psychiatric:        Mood and Affect: Mood normal.        Behavior: Behavior normal.        Diagnostic Tests:   ECHOCARDIOGRAM REPORT       Patient Name:   Chase Lowe Date of Exam: 11/04/2023  Medical Rec #:  [id number]        Height:       71.0 in  Accession #:    [id number]       Weight:       182.2 lb  Date of Birth:  [birth year]        BSA:          2.027 m  Patient Age:    62 years         BP:           110/70 mmHg  Patient Gender: M                HR:           74 bpm.  Exam Location:  Outpatient   Procedure: 2D Echo, Cardiac Doppler and Color Doppler (Both Spectral and  Color            Flow Doppler were utilized during procedure).   Indications:    Heart murmur, systolic [R01.1 ([id number])]    History:        Patient has no prior  history of Echocardiogram  examinations.                 Risk Factors:Former Smoker.    Sonographer:    Rosaline Fujisawa MHA, RDMS, RVT, RDCS  Referring Phys: [id number] PHILIP H MCGOWEN   IMPRESSIONS     1. The aortic valve is abnormal with indeterminant number of cusps.  Unable to determine aortic valve morphology due to image quality. There is  moderate calcification of the aortic valve. Aortic valve regurgitation is  not visualized. Moderate aortic  valve stenosis. Aortic valve area, by VTI measures 0.71 cm. Aortic valve  mean gradient measures 25.0 mmHg. Aortic valve Vmax measures 3.37 m/s.   2. Aortic dilatation noted. Aneurysm of the ascending aorta, measuring 47  mm. There is moderate dilatation of the ascending aorta.   3. Left ventricular ejection fraction, by estimation, is 55 to 60%. Left  ventricular ejection fraction by 3D volume is 57 %. The left ventricle has  normal function. The left ventricle has no regional wall motion  abnormalities. There is mild left  ventricular hypertrophy. Left ventricular diastolic parameters were  normal. The average left ventricular global longitudinal strain is -18.3  %. The global longitudinal strain is normal.   4. Normal right ventricular free wall strain, -28.1%. . Right ventricular  systolic function is normal. The right ventricular size is mildly  enlarged. Tricuspid regurgitation signal is inadequate for assessing PA  pressure.   5. The mitral valve is grossly normal. Mild mitral valve regurgitation.  No evidence of mitral stenosis.   6. The inferior vena cava is normal in size with greater than 50%  respiratory variability, suggesting right atrial pressure of 3 mmHg.   FINDINGS   Left Ventricle: Left ventricular ejection fraction, by estimation, is 55  to 60%. Left ventricular ejection fraction by 3D volume is 57 %. The left  ventricle has normal function. The left ventricle has no regional wall  motion abnormalities. The  average  left ventricular global longitudinal strain is -18.3 %. Strain was  performed and the global longitudinal strain is normal. The left  ventricular internal cavity size was normal in size. There is mild left  ventricular hypertrophy. Left ventricular  diastolic parameters were normal.   Right Ventricle: Normal right ventricular free wall strain, -28.1%. The  right ventricular size is mildly enlarged. No increase in right  ventricular wall thickness. Right ventricular systolic function is normal.  Tricuspid regurgitation signal is  inadequate for assessing PA pressure. The tricuspid regurgitant velocity  is 1.51 m/s, and with an assumed right atrial pressure of 3 mmHg, the  estimated right ventricular systolic pressure is 12.1 mmHg.   Left Atrium: Grossly normal left atrial reservoir strain, 37.1%. Left  atrial size was normal in size.   Right Atrium: Right atrial size was normal in size.   Pericardium: There is no evidence of pericardial effusion.   Mitral Valve: The mitral valve is grossly normal. Mild mitral valve  regurgitation. No evidence of mitral valve stenosis.   Tricuspid Valve: The tricuspid valve is normal in structure. Tricuspid  valve regurgitation is trivial. No evidence of tricuspid stenosis.   Aortic Valve: The aortic valve has an indeterminant number of cusps. There  is moderate calcification of the aortic valve. Aortic valve regurgitation  is not visualized. Moderate aortic stenosis is present. Aortic valve mean  gradient measures 25.0 mmHg.  Aortic valve peak gradient measures 45.5 mmHg. Aortic valve area, by VTI  measures 0.71 cm.   Pulmonic Valve: The pulmonic valve was not well visualized. Pulmonic valve  regurgitation is not visualized. No evidence of pulmonic stenosis.   Aorta: Aortic dilatation noted. There is moderate dilatation of the  ascending aorta. There is an aneurysm involving the ascending aorta  measuring 47 mm.   Venous: The  inferior vena cava is normal in size with greater than 50%  respiratory variability, suggesting right atrial pressure of 3 mmHg.   IAS/Shunts: No atrial level shunt detected by color flow Doppler.   Additional Comments: 3D was  performed not requiring image post processing  on an independent workstation and was normal.     LEFT VENTRICLE  PLAX 2D  LVIDd:         4.26 cm         Diastology  LVIDs:         2.81 cm         LV e' medial:    7.51 cm/s  LV PW:         1.13 cm         LV E/e' medial:  12.6  LV IVS:        1.18 cm         LV e' lateral:   10.10 cm/s  LVOT diam:     1.77 cm         LV E/e' lateral: 9.4  LV SV:         52  LV SV Index:   26              2D Longitudinal  LVOT Area:     2.46 cm        Strain                                 2D Strain GLS   -18.3 %                                 Avg:                                   3D Volume EF                                 LV 3D EF:    Left                                              ventricul                                              ar                                              ejection                                              fraction                                              by 3D  volume is                                              57 %.                                   3D Volume EF:                                 3D EF:        57 %                                 LV EDV:       146 ml                                 LV ESV:       63 ml                                 LV SV:        83 ml   RIGHT VENTRICLE             IVC  RV Basal diam:  3.73 cm     IVC diam: 1.59 cm  RV Mid diam:    3.58 cm  RV S prime:     13.20 cm/s  TAPSE (M-mode): 1.9 cm   LEFT ATRIUM             Index        RIGHT ATRIUM           Index  LA diam:        2.87 cm 1.42 cm/m   RA Area:     16.10 cm  LA Vol (A2C):   44.4 ml 21.91 ml/m  RA Volume:   44.40 ml  21.91 ml/m  LA  Vol (A4C):   31.8 ml 15.69 ml/m  LA Biplane Vol: 38.3 ml 18.90 ml/m   AORTIC VALVE  AV Area (Vmax):    0.69 cm  AV Area (Vmean):   0.71 cm  AV Area (VTI):     0.71 cm  AV Vmax:           337.33 cm/s  AV Vmean:          224.200 cm/s  AV VTI:            0.729 m  AV Peak Grad:      45.5 mmHg  AV Mean Grad:      25.0 mmHg  LVOT Vmax:         94.00 cm/s  LVOT Vmean:        64.800 cm/s  LVOT VTI:          0.211 m  LVOT/AV VTI ratio: 0.29    AORTA  Ao Root diam: 3.46 cm  Ao Asc diam:  4.71 cm   MITRAL VALVE               TRICUSPID VALVE  MV Area (PHT): 3.99 cm  TR Peak grad:   9.1 mmHg  MV Decel Time: 190 msec    TR Vmax:        151.00 cm/s  MR Peak grad: 46.0 mmHg  MR Vmax:      339.00 cm/s  SHUNTS  MV E velocity: 94.60 cm/s  Systemic VTI:  0.21 m  MV A velocity: 96.60 cm/s  Systemic Diam: 1.77 cm  MV E/A ratio:  0.98   Soyla Merck MD  Electronically signed by Soyla Merck MD  Signature Date/Time: 11/07/2023/7:44:47 AM        Final      Narrative & Impression  EXAMINATION: CT ANGIO CHEST AORTA W & OR WO CONTRAST   CLINICAL INDICATION: Male, 63 years old. Aortic aneurysm suspected   TECHNIQUE: Axial CTA of the chest with 100 cc Omnipaque  300 intravenous contrast. Multiplanar and 3D reformations provided. Unless otherwise specified, incidental thyroid, adrenal, renal lesions do not require dedicated imaging follow up. Additionally, any mentioned pulmonary nodules do not require dedicated imaging follow-up based on the Fleischner guidelines unless otherwise specified. Coronary calcifications are not identified unless otherwise specified.   COMPARISON:   FINDINGS:   There is a 5.1 cm ascending thoracic aortic aneurysm.   The aortic arch and descending thoracic aorta are normal in caliber. The origins of the great vessels are patent.   The visualized thyroid is normal. The main pulmonary artery is normal in caliber. The heart is normal in size. There are  coronary calcifications. There is no free fluid or pathologic lymphadenopathy by size criteria. The visualized upper abdominal structures appear within normal limits. The trachea and mainstem bronchi are patent. Subsegmental atelectatic changes are noted within the lingula base is. The lungs are otherwise clear. There are degenerative changes of the spine which are mild.   IMPRESSION:   5.1 cm ascending thoracic aortic aneurysm.   DOSE REDUCTION: This exam was performed according to our departmental dose-optimization program which includes automated exposure control, adjustment of the mA and/or kV according to patient size and/or use of iterative reconstruction technique.   Electronically signed by: [place] Engel MD 11/22/2023 06:56 AM EDT RP Workstation: [id number]        Impression:   This 63 year old gentleman has stage C asymptomatic severe low-flow/low gradient bicuspid aortic valve stenosis and a 5.1 cm fusiform ascending aortic aneurysm.  I have personally reviewed his 2D echocardiogram and CTA studies.  His echocardiogram shows a severely calcified aortic valve which looks bicuspid to me.  The mean gradient was only 25 mmHg but the valve area was measured at 0.71 cm and the stroke-volume index was low.  It looks like a severely stenotic valve with poor leaflet mobility.  CTA of the chest shows some enlargement of the aortic root which I measure at 4.3 cm with a high takeoff of the left main and the ascending aorta has fusiform enlargement to 5.1 cm.  It decreases back towards normal size at the takeoff of the innominate artery.  Cardiac cath showed mild non-obstructive coronary artery disease. I think the best treatment for this is replacement of the aortic valve and ascending aortic aneurysm by Bentall procedure using a period of deep hypothermic circulatory arrest to replace the ascending aorta up to the takeoff of the innominate artery with hemiarch replacement.  I reviewed the echo  and CT images with the patient and his wife and discussed the surgical procedure.  All their questions have been answered at this time.  We discussed the pros and cons  of mechanical and bioprosthetic valves and I have recommended a bioprosthetic valve given his age of 87 and likelihood of further degenerative arthritis requiring anti-inflammatory agents. I discussed the operative procedure with the patient and his wife including alternatives, benefits and risks; including but not limited to bleeding, blood transfusion, infection, stroke, myocardial infarction, graft failure, heart block requiring a permanent pacemaker, organ dysfunction, and death.  Oneil KATHEE Bers understands and agrees to proceed.     Plan:   Bentall procedure using a bioprosthetic valve conduit and replacement of the ascending aortic aneurysm under deep hypothermic circulatory arrest.      Dorise [name], MD [facility] [phone]

## 2024-02-17 ENCOUNTER — Inpatient Hospital Stay (HOSPITAL_COMMUNITY)
Admission: RE | Admit: 2024-02-17 | Discharge: 2024-02-21 | DRG: 221 | Disposition: A | Discharge status: Home / Self Care | Attending: Surgery | Admitting: Surgery

## 2024-02-17 ENCOUNTER — Other Ambulatory Visit: Payer: Self-pay

## 2024-02-17 ENCOUNTER — Other Ambulatory Visit (HOSPITAL_COMMUNITY): Payer: Self-pay

## 2024-02-17 ENCOUNTER — Inpatient Hospital Stay (HOSPITAL_COMMUNITY)
Admission: RE | Disposition: A | Discharge status: Home / Self Care | Payer: Self-pay | Source: Home / Self Care | Attending: Surgery

## 2024-02-17 ENCOUNTER — Inpatient Hospital Stay (HOSPITAL_COMMUNITY): Admitting: Certified Registered Nurse Anesthetist

## 2024-02-17 ENCOUNTER — Inpatient Hospital Stay (HOSPITAL_COMMUNITY): Payer: Self-pay | Admitting: Physician Assistant

## 2024-02-17 ENCOUNTER — Encounter (HOSPITAL_COMMUNITY): Payer: Self-pay | Admitting: Surgery

## 2024-02-17 ENCOUNTER — Inpatient Hospital Stay (HOSPITAL_COMMUNITY)

## 2024-02-17 DIAGNOSIS — Z7982 Long term (current) use of aspirin: Secondary | ICD-10-CM | POA: Diagnosis not present

## 2024-02-17 DIAGNOSIS — Z8679 Personal history of other diseases of the circulatory system: Secondary | ICD-10-CM | POA: Diagnosis not present

## 2024-02-17 DIAGNOSIS — Z4682 Encounter for fitting and adjustment of non-vascular catheter: Secondary | ICD-10-CM | POA: Diagnosis not present

## 2024-02-17 DIAGNOSIS — E7800 Pure hypercholesterolemia, unspecified: Secondary | ICD-10-CM | POA: Diagnosis present

## 2024-02-17 DIAGNOSIS — I10 Essential (primary) hypertension: Secondary | ICD-10-CM | POA: Diagnosis not present

## 2024-02-17 DIAGNOSIS — K219 Gastro-esophageal reflux disease without esophagitis: Secondary | ICD-10-CM | POA: Diagnosis present

## 2024-02-17 DIAGNOSIS — Z981 Arthrodesis status: Secondary | ICD-10-CM | POA: Diagnosis not present

## 2024-02-17 DIAGNOSIS — I7121 Aneurysm of the ascending aorta, without rupture: Secondary | ICD-10-CM

## 2024-02-17 DIAGNOSIS — Z48812 Encounter for surgical aftercare following surgery on the circulatory system: Secondary | ICD-10-CM | POA: Diagnosis not present

## 2024-02-17 DIAGNOSIS — Z96653 Presence of artificial knee joint, bilateral: Secondary | ICD-10-CM | POA: Diagnosis present

## 2024-02-17 DIAGNOSIS — Z87891 Personal history of nicotine dependence: Secondary | ICD-10-CM | POA: Diagnosis not present

## 2024-02-17 DIAGNOSIS — Z79899 Other long term (current) drug therapy: Secondary | ICD-10-CM | POA: Diagnosis not present

## 2024-02-17 DIAGNOSIS — I2510 Atherosclerotic heart disease of native coronary artery without angina pectoris: Secondary | ICD-10-CM | POA: Diagnosis present

## 2024-02-17 DIAGNOSIS — Z452 Encounter for adjustment and management of vascular access device: Secondary | ICD-10-CM | POA: Diagnosis not present

## 2024-02-17 DIAGNOSIS — Q2381 Bicuspid aortic valve: Principal | ICD-10-CM

## 2024-02-17 DIAGNOSIS — J9811 Atelectasis: Secondary | ICD-10-CM | POA: Diagnosis not present

## 2024-02-17 DIAGNOSIS — R0989 Other specified symptoms and signs involving the circulatory and respiratory systems: Secondary | ICD-10-CM | POA: Diagnosis not present

## 2024-02-17 DIAGNOSIS — J90 Pleural effusion, not elsewhere classified: Secondary | ICD-10-CM | POA: Diagnosis not present

## 2024-02-17 DIAGNOSIS — Z95828 Presence of other vascular implants and grafts: Secondary | ICD-10-CM

## 2024-02-17 DIAGNOSIS — I350 Nonrheumatic aortic (valve) stenosis: Secondary | ICD-10-CM

## 2024-02-17 DIAGNOSIS — M170 Bilateral primary osteoarthritis of knee: Secondary | ICD-10-CM | POA: Diagnosis present

## 2024-02-17 DIAGNOSIS — R011 Cardiac murmur, unspecified: Secondary | ICD-10-CM | POA: Diagnosis not present

## 2024-02-17 DIAGNOSIS — J939 Pneumothorax, unspecified: Secondary | ICD-10-CM | POA: Diagnosis not present

## 2024-02-17 DIAGNOSIS — Z952 Presence of prosthetic heart valve: Secondary | ICD-10-CM

## 2024-02-17 DIAGNOSIS — I358 Other nonrheumatic aortic valve disorders: Secondary | ICD-10-CM | POA: Diagnosis not present

## 2024-02-17 DIAGNOSIS — I712 Thoracic aortic aneurysm, without rupture, unspecified: Secondary | ICD-10-CM | POA: Diagnosis not present

## 2024-02-17 HISTORY — PX: INTRAOPERATIVE TRANSESOPHAGEAL ECHOCARDIOGRAM: SHX5062

## 2024-02-17 HISTORY — PX: BENTALL PROCEDURE: SHX5058

## 2024-02-17 HISTORY — PX: REPLACEMENT ASCENDING AORTA: SHX6068

## 2024-02-17 LAB — POCT I-STAT, CHEM 8
BUN: 14 mg/dL (ref 8–23)
BUN: 15 mg/dL (ref 8–23)
BUN: 13 mg/dL (ref 8–23)
BUN: 15 mg/dL (ref 8–23)
BUN: 15 mg/dL (ref 8–23)
BUN: 16 mg/dL (ref 8–23)
BUN: 13 mg/dL (ref 8–23)
Calcium, Ion: 1.22 mmol/L (ref 1.15–1.40)
Calcium, Ion: 1.25 mmol/L (ref 1.15–1.40)
Calcium, Ion: 1.07 mmol/L — ABNORMAL LOW (ref 1.15–1.40)
Calcium, Ion: 1.12 mmol/L — ABNORMAL LOW (ref 1.15–1.40)
Calcium, Ion: 1.12 mmol/L — ABNORMAL LOW (ref 1.15–1.40)
Calcium, Ion: 1.12 mmol/L — ABNORMAL LOW (ref 1.15–1.40)
Calcium, Ion: 1.04 mmol/L — ABNORMAL LOW (ref 1.15–1.40)
Chloride: 105 mmol/L (ref 98–111)
Chloride: 105 mmol/L (ref 98–111)
Chloride: 104 mmol/L (ref 98–111)
Chloride: 104 mmol/L (ref 98–111)
Chloride: 102 mmol/L (ref 98–111)
Chloride: 103 mmol/L (ref 98–111)
Chloride: 106 mmol/L (ref 98–111)
Creatinine, Ser: 0.8 mg/dL (ref 0.61–1.24)
Creatinine, Ser: 0.8 mg/dL (ref 0.61–1.24)
Creatinine, Ser: 0.8 mg/dL (ref 0.61–1.24)
Creatinine, Ser: 0.8 mg/dL (ref 0.61–1.24)
Creatinine, Ser: 0.7 mg/dL (ref 0.61–1.24)
Creatinine, Ser: 0.8 mg/dL (ref 0.61–1.24)
Creatinine, Ser: 0.7 mg/dL (ref 0.61–1.24)
Glucose, Bld: 110 mg/dL — ABNORMAL HIGH (ref 70–99)
Glucose, Bld: 103 mg/dL — ABNORMAL HIGH (ref 70–99)
Glucose, Bld: 100 mg/dL — ABNORMAL HIGH (ref 70–99)
Glucose, Bld: 172 mg/dL — ABNORMAL HIGH (ref 70–99)
Glucose, Bld: 139 mg/dL — ABNORMAL HIGH (ref 70–99)
Glucose, Bld: 131 mg/dL — ABNORMAL HIGH (ref 70–99)
Glucose, Bld: 131 mg/dL — ABNORMAL HIGH (ref 70–99)
HCT: 36 % — ABNORMAL LOW (ref 39.0–52.0)
HCT: 33 % — ABNORMAL LOW (ref 39.0–52.0)
HCT: 28 % — ABNORMAL LOW (ref 39.0–52.0)
HCT: 29 % — ABNORMAL LOW (ref 39.0–52.0)
HCT: 29 % — ABNORMAL LOW (ref 39.0–52.0)
HCT: 28 % — ABNORMAL LOW (ref 39.0–52.0)
HCT: 26 % — ABNORMAL LOW (ref 39.0–52.0)
Hemoglobin: 12.2 g/dL — ABNORMAL LOW (ref 13.0–17.0)
Hemoglobin: 11.2 g/dL — ABNORMAL LOW (ref 13.0–17.0)
Hemoglobin: 9.5 g/dL — ABNORMAL LOW (ref 13.0–17.0)
Hemoglobin: 9.9 g/dL — ABNORMAL LOW (ref 13.0–17.0)
Hemoglobin: 9.9 g/dL — ABNORMAL LOW (ref 13.0–17.0)
Hemoglobin: 9.5 g/dL — ABNORMAL LOW (ref 13.0–17.0)
Hemoglobin: 8.8 g/dL — ABNORMAL LOW (ref 13.0–17.0)
Potassium: 4.1 mmol/L (ref 3.5–5.1)
Potassium: 4.2 mmol/L (ref 3.5–5.1)
Potassium: 4.1 mmol/L (ref 3.5–5.1)
Potassium: 4.2 mmol/L (ref 3.5–5.1)
Potassium: 4.6 mmol/L (ref 3.5–5.1)
Potassium: 5.4 mmol/L — ABNORMAL HIGH (ref 3.5–5.1)
Potassium: 4.1 mmol/L (ref 3.5–5.1)
Sodium: 139 mmol/L (ref 135–145)
Sodium: 138 mmol/L (ref 135–145)
Sodium: 136 mmol/L (ref 135–145)
Sodium: 137 mmol/L (ref 135–145)
Sodium: 135 mmol/L (ref 135–145)
Sodium: 137 mmol/L (ref 135–145)
Sodium: 140 mmol/L (ref 135–145)
TCO2: 23 mmol/L (ref 22–32)
TCO2: 24 mmol/L (ref 22–32)
TCO2: 24 mmol/L (ref 22–32)
TCO2: 25 mmol/L (ref 22–32)
TCO2: 24 mmol/L (ref 22–32)
TCO2: 25 mmol/L (ref 22–32)
TCO2: 22 mmol/L (ref 22–32)

## 2024-02-17 LAB — POCT I-STAT 7, (LYTES, BLD GAS, ICA,H+H)
Acid-base deficit: 5 mmol/L — ABNORMAL HIGH (ref 0.0–2.0)
Acid-base deficit: 2 mmol/L (ref 0.0–2.0)
Acid-base deficit: 4 mmol/L — ABNORMAL HIGH (ref 0.0–2.0)
Acid-base deficit: 2 mmol/L (ref 0.0–2.0)
Acid-base deficit: 6 mmol/L — ABNORMAL HIGH (ref 0.0–2.0)
Acid-base deficit: 4 mmol/L — ABNORMAL HIGH (ref 0.0–2.0)
Acid-base deficit: 6 mmol/L — ABNORMAL HIGH (ref 0.0–2.0)
Acid-base deficit: 4 mmol/L — ABNORMAL HIGH (ref 0.0–2.0)
Bicarbonate: 20 mmol/L (ref 20.0–28.0)
Bicarbonate: 24 mmol/L (ref 20.0–28.0)
Bicarbonate: 22.5 mmol/L (ref 20.0–28.0)
Bicarbonate: 23.4 mmol/L (ref 20.0–28.0)
Bicarbonate: 19.3 mmol/L — ABNORMAL LOW (ref 20.0–28.0)
Bicarbonate: 21.5 mmol/L (ref 20.0–28.0)
Bicarbonate: 19.1 mmol/L — ABNORMAL LOW (ref 20.0–28.0)
Bicarbonate: 21.4 mmol/L (ref 20.0–28.0)
Calcium, Ion: 1.24 mmol/L (ref 1.15–1.40)
Calcium, Ion: 1.06 mmol/L — ABNORMAL LOW (ref 1.15–1.40)
Calcium, Ion: 1.12 mmol/L — ABNORMAL LOW (ref 1.15–1.40)
Calcium, Ion: 1.14 mmol/L — ABNORMAL LOW (ref 1.15–1.40)
Calcium, Ion: 1.03 mmol/L — ABNORMAL LOW (ref 1.15–1.40)
Calcium, Ion: 1.09 mmol/L — ABNORMAL LOW (ref 1.15–1.40)
Calcium, Ion: 1.1 mmol/L — ABNORMAL LOW (ref 1.15–1.40)
Calcium, Ion: 1.17 mmol/L (ref 1.15–1.40)
HCT: 39 % (ref 39.0–52.0)
HCT: 27 % — ABNORMAL LOW (ref 39.0–52.0)
HCT: 28 % — ABNORMAL LOW (ref 39.0–52.0)
HCT: 29 % — ABNORMAL LOW (ref 39.0–52.0)
HCT: 26 % — ABNORMAL LOW (ref 39.0–52.0)
HCT: 29 % — ABNORMAL LOW (ref 39.0–52.0)
HCT: 23 % — ABNORMAL LOW (ref 39.0–52.0)
HCT: 27 % — ABNORMAL LOW (ref 39.0–52.0)
Hemoglobin: 13.3 g/dL (ref 13.0–17.0)
Hemoglobin: 9.2 g/dL — ABNORMAL LOW (ref 13.0–17.0)
Hemoglobin: 9.5 g/dL — ABNORMAL LOW (ref 13.0–17.0)
Hemoglobin: 9.9 g/dL — ABNORMAL LOW (ref 13.0–17.0)
Hemoglobin: 8.8 g/dL — ABNORMAL LOW (ref 13.0–17.0)
Hemoglobin: 9.9 g/dL — ABNORMAL LOW (ref 13.0–17.0)
Hemoglobin: 7.8 g/dL — ABNORMAL LOW (ref 13.0–17.0)
Hemoglobin: 9.2 g/dL — ABNORMAL LOW (ref 13.0–17.0)
O2 Saturation: 100 %
O2 Saturation: 100 %
O2 Saturation: 100 %
O2 Saturation: 100 %
O2 Saturation: 100 %
O2 Saturation: 99 %
O2 Saturation: 99 %
O2 Saturation: 94 %
Patient temperature: 35
Patient temperature: 36.1
Patient temperature: 36.5
Potassium: 4 mmol/L (ref 3.5–5.1)
Potassium: 4.4 mmol/L (ref 3.5–5.1)
Potassium: 4 mmol/L (ref 3.5–5.1)
Potassium: 5.1 mmol/L (ref 3.5–5.1)
Potassium: 4.1 mmol/L (ref 3.5–5.1)
Potassium: 4.1 mmol/L (ref 3.5–5.1)
Potassium: 3.7 mmol/L (ref 3.5–5.1)
Potassium: 4.4 mmol/L (ref 3.5–5.1)
Sodium: 140 mmol/L (ref 135–145)
Sodium: 138 mmol/L (ref 135–145)
Sodium: 136 mmol/L (ref 135–145)
Sodium: 138 mmol/L (ref 135–145)
Sodium: 140 mmol/L (ref 135–145)
Sodium: 141 mmol/L (ref 135–145)
Sodium: 143 mmol/L (ref 135–145)
Sodium: 140 mmol/L (ref 135–145)
TCO2: 21 mmol/L — ABNORMAL LOW (ref 22–32)
TCO2: 25 mmol/L (ref 22–32)
TCO2: 24 mmol/L (ref 22–32)
TCO2: 25 mmol/L (ref 22–32)
TCO2: 20 mmol/L — ABNORMAL LOW (ref 22–32)
TCO2: 23 mmol/L (ref 22–32)
TCO2: 20 mmol/L — ABNORMAL LOW (ref 22–32)
TCO2: 23 mmol/L (ref 22–32)
pCO2 arterial: 36.7 mmHg (ref 32–48)
pCO2 arterial: 44.1 mmHg (ref 32–48)
pCO2 arterial: 49.4 mmHg — ABNORMAL HIGH (ref 32–48)
pCO2 arterial: 43.4 mmHg (ref 32–48)
pCO2 arterial: 36.1 mmHg (ref 32–48)
pCO2 arterial: 36.8 mmHg (ref 32–48)
pCO2 arterial: 34 mmHg (ref 32–48)
pCO2 arterial: 38.9 mmHg (ref 32–48)
pH, Arterial: 7.345 — ABNORMAL LOW (ref 7.35–7.45)
pH, Arterial: 7.344 — ABNORMAL LOW (ref 7.35–7.45)
pH, Arterial: 7.265 — ABNORMAL LOW (ref 7.35–7.45)
pH, Arterial: 7.339 — ABNORMAL LOW (ref 7.35–7.45)
pH, Arterial: 7.337 — ABNORMAL LOW (ref 7.35–7.45)
pH, Arterial: 7.367 (ref 7.35–7.45)
pH, Arterial: 7.353 (ref 7.35–7.45)
pH, Arterial: 7.346 — ABNORMAL LOW (ref 7.35–7.45)
pO2, Arterial: 512 mmHg — ABNORMAL HIGH (ref 83–108)
pO2, Arterial: 487 mmHg — ABNORMAL HIGH (ref 83–108)
pO2, Arterial: 383 mmHg — ABNORMAL HIGH (ref 83–108)
pO2, Arterial: 329 mmHg — ABNORMAL HIGH (ref 83–108)
pO2, Arterial: 288 mmHg — ABNORMAL HIGH (ref 83–108)
pO2, Arterial: 145 mmHg — ABNORMAL HIGH (ref 83–108)
pO2, Arterial: 169 mmHg — ABNORMAL HIGH (ref 83–108)
pO2, Arterial: 71 mmHg — ABNORMAL LOW (ref 83–108)

## 2024-02-17 LAB — CBC
HCT: 28.9 % — ABNORMAL LOW (ref 39.0–52.0)
HCT: 28 % — ABNORMAL LOW (ref 39.0–52.0)
Hemoglobin: 9.9 g/dL — ABNORMAL LOW (ref 13.0–17.0)
Hemoglobin: 9.5 g/dL — ABNORMAL LOW (ref 13.0–17.0)
MCH: 30.7 pg (ref 26.0–34.0)
MCH: 30.3 pg (ref 26.0–34.0)
MCHC: 34.3 g/dL (ref 30.0–36.0)
MCHC: 33.9 g/dL (ref 30.0–36.0)
MCV: 89.5 fL (ref 80.0–100.0)
MCV: 89.2 fL (ref 80.0–100.0)
Platelets: 87 K/uL — ABNORMAL LOW (ref 150–400)
Platelets: 81 K/uL — ABNORMAL LOW (ref 150–400)
RBC: 3.23 MIL/uL — ABNORMAL LOW (ref 4.22–5.81)
RBC: 3.14 MIL/uL — ABNORMAL LOW (ref 4.22–5.81)
RDW: 12.9 % (ref 11.5–15.5)
RDW: 12.9 % (ref 11.5–15.5)
WBC: 7.6 K/uL (ref 4.0–10.5)
WBC: 6.4 K/uL (ref 4.0–10.5)
nRBC: 0 % (ref 0.0–0.2)
nRBC: 0 % (ref 0.0–0.2)

## 2024-02-17 LAB — POCT I-STAT EG7
Acid-base deficit: 2 mmol/L (ref 0.0–2.0)
Bicarbonate: 24.4 mmol/L (ref 20.0–28.0)
Calcium, Ion: 1.12 mmol/L — ABNORMAL LOW (ref 1.15–1.40)
HCT: 28 % — ABNORMAL LOW (ref 39.0–52.0)
Hemoglobin: 9.5 g/dL — ABNORMAL LOW (ref 13.0–17.0)
O2 Saturation: 85 %
Potassium: 4.2 mmol/L (ref 3.5–5.1)
Sodium: 139 mmol/L (ref 135–145)
TCO2: 26 mmol/L (ref 22–32)
pCO2, Ven: 47.1 mmHg (ref 44–60)
pH, Ven: 7.323 (ref 7.25–7.43)
pO2, Ven: 54 mmHg — ABNORMAL HIGH (ref 32–45)

## 2024-02-17 LAB — GLUCOSE, CAPILLARY
Glucose-Capillary: 105 mg/dL — ABNORMAL HIGH (ref 70–99)
Glucose-Capillary: 117 mg/dL — ABNORMAL HIGH (ref 70–99)
Glucose-Capillary: 117 mg/dL — ABNORMAL HIGH (ref 70–99)
Glucose-Capillary: 100 mg/dL — ABNORMAL HIGH (ref 70–99)
Glucose-Capillary: 91 mg/dL (ref 70–99)
Glucose-Capillary: 99 mg/dL (ref 70–99)
Glucose-Capillary: 142 mg/dL — ABNORMAL HIGH (ref 70–99)
Glucose-Capillary: 166 mg/dL — ABNORMAL HIGH (ref 70–99)
Glucose-Capillary: 135 mg/dL — ABNORMAL HIGH (ref 70–99)

## 2024-02-17 LAB — MAGNESIUM: Magnesium: 3 mg/dL — ABNORMAL HIGH (ref 1.7–2.4)

## 2024-02-17 LAB — PROTIME-INR
INR: 1.4 — ABNORMAL HIGH (ref 0.8–1.2)
Prothrombin Time: 18.2 s — ABNORMAL HIGH (ref 11.4–15.2)

## 2024-02-17 LAB — BASIC METABOLIC PANEL WITH GFR
Anion gap: 9 (ref 5–15)
BUN: 14 mg/dL (ref 8–23)
CO2: 21 mmol/L — ABNORMAL LOW (ref 22–32)
Calcium: 8.1 mg/dL — ABNORMAL LOW (ref 8.9–10.3)
Chloride: 107 mmol/L (ref 98–111)
Creatinine, Ser: 0.88 mg/dL (ref 0.61–1.24)
GFR, Estimated: >60 mL/min (ref >60)
Glucose, Bld: 141 mg/dL — ABNORMAL HIGH (ref 70–99)
Potassium: 4.4 mmol/L (ref 3.5–5.1)
Sodium: 137 mmol/L (ref 135–145)

## 2024-02-17 LAB — APTT: aPTT: 37 s — ABNORMAL HIGH (ref 24–36)

## 2024-02-17 LAB — ABO/RH: ABO/RH(D): O POS

## 2024-02-17 LAB — HEMOGLOBIN AND HEMATOCRIT, BLOOD
HCT: 29.3 % — ABNORMAL LOW (ref 39.0–52.0)
Hemoglobin: 9.9 g/dL — ABNORMAL LOW (ref 13.0–17.0)

## 2024-02-17 LAB — FIBRINOGEN: Fibrinogen: 215 mg/dL (ref 210–475)

## 2024-02-17 LAB — PLATELET COUNT: Platelets: 92 K/uL — ABNORMAL LOW (ref 150–400)

## 2024-02-17 SURGERY — BENTALL PROCEDURE
Anesthesia: General | Site: Chest

## 2024-02-17 MED ORDER — ASPIRIN 81 MG PO CHEW
81.0000 mg | CHEWABLE_TABLET | Freq: Once | ORAL | Status: DC
  Filled 2024-02-17: qty 1

## 2024-02-17 MED ORDER — MIDAZOLAM HCL (PF) 10 MG/2ML IJ SOLN
INTRAMUSCULAR | Status: AC
  Filled 2024-02-17: qty 2

## 2024-02-17 MED ORDER — CHLORHEXIDINE GLUCONATE 0.12 % MT SOLN
15.0000 mL | Freq: Once | OROMUCOSAL | Status: DC
  Filled 2024-02-17: qty 15

## 2024-02-17 MED ORDER — PROTAMINE SULFATE 10 MG/ML IV SOLN
INTRAVENOUS | Status: AC
  Filled 2024-02-17: qty 5

## 2024-02-17 MED ORDER — MIDAZOLAM HCL (PF) 5 MG/ML IJ SOLN
INTRAMUSCULAR | Status: DC | PRN
  Administered 2024-02-17 (×3): 2 mg via INTRAVENOUS
  Administered 2024-02-17: 1 mg via INTRAVENOUS

## 2024-02-17 MED ORDER — POTASSIUM CHLORIDE 10 MEQ/50ML IV SOLN: 10.0000 meq | INTRAVENOUS | Status: AC

## 2024-02-17 MED ORDER — LACTATED RINGERS IV SOLN: INTRAVENOUS | Status: DC

## 2024-02-17 MED ORDER — ASPIRIN 81 MG PO CHEW: 81.0000 mg | CHEWABLE_TABLET | Freq: Every day | ORAL | Status: DC

## 2024-02-17 MED ORDER — ONDANSETRON HCL 4 MG/2ML IJ SOLN
INTRAMUSCULAR | Status: AC
  Filled 2024-02-17: qty 2

## 2024-02-17 MED ORDER — SODIUM CHLORIDE 0.9% FLUSH
3.0000 mL | Freq: Two times a day (BID) | INTRAVENOUS | Status: DC
  Administered 2024-02-18 – 2024-02-20 (×5): 3 mL via INTRAVENOUS

## 2024-02-17 MED ORDER — FENTANYL CITRATE (PF) 250 MCG/5ML IJ SOLN
INTRAMUSCULAR | Status: AC
  Filled 2024-02-17: qty 5

## 2024-02-17 MED ORDER — VANCOMYCIN HCL IN DEXTROSE 1-5 GM/200ML-% IV SOLN
1000.0000 mg | Freq: Once | INTRAVENOUS | Status: AC
  Administered 2024-02-17: 1000 mg via INTRAVENOUS
  Filled 2024-02-17: qty 200

## 2024-02-17 MED ORDER — LACTATED RINGERS IV SOLN: INTRAVENOUS | Status: DC | PRN

## 2024-02-17 MED ORDER — ACETAMINOPHEN 160 MG/5ML PO SOLN: 650.0000 mg | Freq: Once | ORAL | Status: DC

## 2024-02-17 MED ORDER — METOPROLOL TARTRATE 25 MG/10 ML ORAL SUSPENSION: 12.5000 mg | Freq: Two times a day (BID) | ORAL | Status: DC

## 2024-02-17 MED ORDER — SODIUM CHLORIDE 0.9 % IV SOLN: INTRAVENOUS | Status: AC | PRN

## 2024-02-17 MED ORDER — SODIUM CHLORIDE 0.9 % IV SOLN: 250.0000 mL | INTRAVENOUS | Status: AC

## 2024-02-17 MED ORDER — SODIUM CHLORIDE 0.45 % IV SOLN: INTRAVENOUS | Status: AC | PRN

## 2024-02-17 MED ORDER — PROPOFOL 10 MG/ML IV BOLUS
INTRAVENOUS | Status: AC
  Filled 2024-02-17: qty 20

## 2024-02-17 MED ORDER — METOPROLOL TARTRATE 5 MG/5ML IV SOLN: 2.5000 mg | INTRAVENOUS | Status: DC | PRN

## 2024-02-17 MED ORDER — METOPROLOL TARTRATE 12.5 MG HALF TABLET: 12.5000 mg | ORAL_TABLET | Freq: Two times a day (BID) | ORAL | Status: DC

## 2024-02-17 MED ORDER — ROCURONIUM BROMIDE 10 MG/ML (PF) SYRINGE
PREFILLED_SYRINGE | INTRAVENOUS | Status: DC | PRN
  Administered 2024-02-17: 50 mg via INTRAVENOUS
  Administered 2024-02-17: 100 mg via INTRAVENOUS
  Administered 2024-02-17: 50 mg via INTRAVENOUS

## 2024-02-17 MED ORDER — NOREPINEPHRINE 4 MG/250ML-% IV SOLN
0.0000 ug/min | INTRAVENOUS | Status: DC
  Administered 2024-02-17: 2 ug/min via INTRAVENOUS

## 2024-02-17 MED ORDER — PHENYLEPHRINE 80 MCG/ML (10ML) SYRINGE FOR IV PUSH (FOR BLOOD PRESSURE SUPPORT)
PREFILLED_SYRINGE | INTRAVENOUS | Status: AC
  Filled 2024-02-17: qty 10

## 2024-02-17 MED ORDER — TRAMADOL HCL 50 MG PO TABS
50.0000 mg | ORAL_TABLET | ORAL | Status: DC | PRN
  Administered 2024-02-17 – 2024-02-18 (×3): 100 mg via ORAL
  Administered 2024-02-19: 50 mg via ORAL
  Filled 2024-02-17: qty 2
  Filled 2024-02-17: qty 1
  Filled 2024-02-17 (×3): qty 2

## 2024-02-17 MED ORDER — SCOPOLAMINE 1 MG/3DAYS TD PT72
MEDICATED_PATCH | TRANSDERMAL | Status: AC
  Filled 2024-02-17: qty 1

## 2024-02-17 MED ORDER — FENTANYL CITRATE (PF) 250 MCG/5ML IJ SOLN
INTRAMUSCULAR | Status: AC
Start: 2024-02-17 — End: 2024-02-17
  Filled 2024-02-17: qty 5

## 2024-02-17 MED ORDER — CHLORHEXIDINE GLUCONATE 0.12 % MT SOLN
15.0000 mL | OROMUCOSAL | Status: AC
  Administered 2024-02-17: 15 mL via OROMUCOSAL
  Filled 2024-02-17: qty 15

## 2024-02-17 MED ORDER — BISACODYL 5 MG PO TBEC
10.0000 mg | DELAYED_RELEASE_TABLET | Freq: Every day | ORAL | Status: DC
  Administered 2024-02-18 – 2024-02-20 (×3): 10 mg via ORAL
  Filled 2024-02-17 (×3): qty 2

## 2024-02-17 MED ORDER — SODIUM CHLORIDE 0.9 % IV SOLN: INTRAVENOUS | Status: DC | PRN

## 2024-02-17 MED ORDER — SODIUM CHLORIDE 0.9 % IV SOLN: INTRAVENOUS | Status: AC

## 2024-02-17 MED ORDER — HEMOSTATIC AGENTS (NO CHARGE) OPTIME
TOPICAL | Status: DC | PRN
  Administered 2024-02-17: 1 via TOPICAL

## 2024-02-17 MED ORDER — HEPARIN SODIUM (PORCINE) 1000 UNIT/ML IJ SOLN
INTRAMUSCULAR | Status: DC | PRN
  Administered 2024-02-17: 29000 [IU] via INTRAVENOUS

## 2024-02-17 MED ORDER — PANTOPRAZOLE SODIUM 40 MG IV SOLR
40.0000 mg | Freq: Every day | INTRAVENOUS | Status: AC
  Administered 2024-02-17 – 2024-02-18 (×2): 40 mg via INTRAVENOUS
  Filled 2024-02-17 (×2): qty 10

## 2024-02-17 MED ORDER — INSULIN REGULAR(HUMAN) IN NACL 100-0.9 UT/100ML-% IV SOLN
INTRAVENOUS | Status: DC
  Administered 2024-02-17: 1.8 [IU]/h via INTRAVENOUS

## 2024-02-17 MED ORDER — ~~LOC~~ CARDIAC SURGERY, PATIENT & FAMILY EDUCATION
Freq: Once | Status: DC
  Filled 2024-02-17: qty 1

## 2024-02-17 MED ORDER — ACETAMINOPHEN 500 MG PO TABS
1000.0000 mg | ORAL_TABLET | Freq: Four times a day (QID) | ORAL | Status: DC
  Administered 2024-02-17 – 2024-02-21 (×14): 1000 mg via ORAL
  Filled 2024-02-17 (×14): qty 2

## 2024-02-17 MED ORDER — KETOROLAC TROMETHAMINE 15 MG/ML IJ SOLN: 15.0000 mg | Freq: Four times a day (QID) | INTRAMUSCULAR | Status: DC | PRN

## 2024-02-17 MED ORDER — PHENYLEPHRINE 80 MCG/ML (10ML) SYRINGE FOR IV PUSH (FOR BLOOD PRESSURE SUPPORT)
PREFILLED_SYRINGE | INTRAVENOUS | Status: DC | PRN
  Administered 2024-02-17 (×2): 80 ug via INTRAVENOUS
  Administered 2024-02-17: 40 ug via INTRAVENOUS
  Administered 2024-02-17 (×3): 80 ug via INTRAVENOUS
  Administered 2024-02-17: 160 ug via INTRAVENOUS
  Administered 2024-02-17 (×7): 80 ug via INTRAVENOUS
  Administered 2024-02-17: 160 ug via INTRAVENOUS
  Administered 2024-02-17: 80 ug via INTRAVENOUS
  Administered 2024-02-17: 40 ug via INTRAVENOUS
  Administered 2024-02-17 (×2): 80 ug via INTRAVENOUS

## 2024-02-17 MED ORDER — PHENYLEPHRINE 80 MCG/ML (10ML) SYRINGE FOR IV PUSH (FOR BLOOD PRESSURE SUPPORT)
PREFILLED_SYRINGE | INTRAVENOUS | Status: AC
  Filled 2024-02-17: qty 20

## 2024-02-17 MED ORDER — THROMBIN (RECOMBINANT) 20000 UNITS EX SOLR
CUTANEOUS | Status: AC
  Filled 2024-02-17: qty 20000

## 2024-02-17 MED ORDER — ORAL CARE MOUTH RINSE
15.0000 mL | OROMUCOSAL | Status: DC
  Administered 2024-02-17 (×3): 15 mL via OROMUCOSAL

## 2024-02-17 MED ORDER — PANTOPRAZOLE SODIUM 40 MG PO TBEC
40.0000 mg | DELAYED_RELEASE_TABLET | Freq: Every day | ORAL | Status: DC
  Administered 2024-02-19 – 2024-02-21 (×3): 40 mg via ORAL
  Filled 2024-02-17 (×3): qty 1

## 2024-02-17 MED ORDER — ROCURONIUM BROMIDE 10 MG/ML (PF) SYRINGE
PREFILLED_SYRINGE | INTRAVENOUS | Status: AC
  Filled 2024-02-17: qty 10

## 2024-02-17 MED ORDER — SCOPOLAMINE 1 MG/3DAYS TD PT72
MEDICATED_PATCH | TRANSDERMAL | Status: DC | PRN
  Administered 2024-02-17: 1 via TRANSDERMAL

## 2024-02-17 MED ORDER — PROPOFOL 10 MG/ML IV BOLUS
INTRAVENOUS | Status: DC | PRN
  Administered 2024-02-17: 100 mg via INTRAVENOUS
  Administered 2024-02-17: 50 mg via INTRAVENOUS
  Administered 2024-02-17: 150 mg via INTRAVENOUS
  Administered 2024-02-17: 50 mg via INTRAVENOUS

## 2024-02-17 MED ORDER — LACTATED RINGERS IV SOLN: INTRAVENOUS | Status: AC

## 2024-02-17 MED ORDER — ACETAMINOPHEN 500 MG PO TABS
1000.0000 mg | ORAL_TABLET | Freq: Once | ORAL | Status: AC
  Administered 2024-02-17: 1000 mg via ORAL
  Filled 2024-02-17: qty 2

## 2024-02-17 MED ORDER — MAGNESIUM SULFATE 4 GM/100ML IV SOLN
4.0000 g | Freq: Once | INTRAVENOUS | Status: AC
  Administered 2024-02-17: 4 g via INTRAVENOUS
  Filled 2024-02-17: qty 100

## 2024-02-17 MED ORDER — CHLORHEXIDINE GLUCONATE 0.12 % MT SOLN
15.0000 mL | Freq: Once | OROMUCOSAL | Status: AC
  Administered 2024-02-17: 15 mL via OROMUCOSAL

## 2024-02-17 MED ORDER — DOCUSATE SODIUM 100 MG PO CAPS
200.0000 mg | ORAL_CAPSULE | Freq: Every day | ORAL | Status: DC
  Administered 2024-02-18 – 2024-02-20 (×3): 200 mg via ORAL
  Filled 2024-02-17 (×4): qty 2

## 2024-02-17 MED ORDER — CHLORHEXIDINE GLUCONATE CLOTH 2 % EX PADS
6.0000 | MEDICATED_PAD | Freq: Every day | CUTANEOUS | Status: DC
  Administered 2024-02-17 – 2024-02-19 (×3): 6 via TOPICAL

## 2024-02-17 MED ORDER — ASPIRIN 81 MG PO TBEC
81.0000 mg | DELAYED_RELEASE_TABLET | Freq: Every day | ORAL | Status: DC
  Administered 2024-02-18 – 2024-02-20 (×3): 81 mg via ORAL
  Filled 2024-02-17 (×3): qty 1

## 2024-02-17 MED ORDER — PROTAMINE SULFATE 10 MG/ML IV SOLN
INTRAVENOUS | Status: DC | PRN
  Administered 2024-02-17: 290 mg via INTRAVENOUS

## 2024-02-17 MED ORDER — METHYLPREDNISOLONE SODIUM SUCC 125 MG IJ SOLR
INTRAMUSCULAR | Status: DC | PRN
  Administered 2024-02-17: 125 mg via INTRAVENOUS

## 2024-02-17 MED ORDER — CHLORHEXIDINE GLUCONATE 4 % EX SOLN: 30.0000 mL | CUTANEOUS | Status: DC

## 2024-02-17 MED ORDER — HEPARIN SODIUM (PORCINE) 1000 UNIT/ML IJ SOLN
INTRAMUSCULAR | Status: AC
  Filled 2024-02-17: qty 1

## 2024-02-17 MED ORDER — METOCLOPRAMIDE HCL 5 MG/ML IJ SOLN
10.0000 mg | Freq: Four times a day (QID) | INTRAMUSCULAR | Status: AC
  Administered 2024-02-17 – 2024-02-18 (×6): 10 mg via INTRAVENOUS
  Filled 2024-02-17 (×5): qty 2

## 2024-02-17 MED ORDER — SODIUM CHLORIDE 0.9% IV SOLUTION: Freq: Once | INTRAVENOUS | Status: DC

## 2024-02-17 MED ORDER — METHYLPREDNISOLONE SODIUM SUCC 125 MG IJ SOLR
INTRAMUSCULAR | Status: AC
Start: 2024-02-17 — End: 2024-02-17
  Filled 2024-02-17: qty 2

## 2024-02-17 MED ORDER — ALBUMIN HUMAN 5 % IV SOLN
250.0000 mL | INTRAVENOUS | Status: AC | PRN
  Administered 2024-02-17 (×5): 12.5 g via INTRAVENOUS
  Filled 2024-02-17 (×2): qty 250

## 2024-02-17 MED ORDER — SODIUM CHLORIDE 0.9% FLUSH: 3.0000 mL | INTRAVENOUS | Status: DC | PRN

## 2024-02-17 MED ORDER — ORAL CARE MOUTH RINSE: 15.0000 mL | OROMUCOSAL | Status: DC | PRN

## 2024-02-17 MED ORDER — CEFAZOLIN SODIUM-DEXTROSE 2-4 GM/100ML-% IV SOLN
2.0000 g | Freq: Three times a day (TID) | INTRAVENOUS | Status: AC
  Administered 2024-02-17 – 2024-02-19 (×6): 2 g via INTRAVENOUS
  Filled 2024-02-17 (×6): qty 100

## 2024-02-17 MED ORDER — ORAL CARE MOUTH RINSE: 15.0000 mL | Freq: Once | OROMUCOSAL | Status: AC

## 2024-02-17 MED ORDER — THROMBIN 20000 UNITS EX SOLR: OROMUCOSAL | Status: DC | PRN

## 2024-02-17 MED ORDER — DEXTROSE 50 % IV SOLN: 0.0000 mL | INTRAVENOUS | Status: DC | PRN

## 2024-02-17 MED ORDER — DEXMEDETOMIDINE HCL IN NACL 400 MCG/100ML IV SOLN
0.0000 ug/kg/h | INTRAVENOUS | Status: DC
  Administered 2024-02-17: 0.7 ug/kg/h via INTRAVENOUS

## 2024-02-17 MED ORDER — BISACODYL 10 MG RE SUPP: 10.0000 mg | Freq: Every day | RECTAL | Status: DC

## 2024-02-17 MED ORDER — NITROGLYCERIN IN D5W 200-5 MCG/ML-% IV SOLN: 0.0000 ug/min | INTRAVENOUS | Status: DC

## 2024-02-17 MED ORDER — ACETAMINOPHEN 160 MG/5ML PO SOLN: 1000.0000 mg | Freq: Four times a day (QID) | ORAL | Status: DC

## 2024-02-17 MED ORDER — METOPROLOL TARTRATE 12.5 MG HALF TABLET
12.5000 mg | ORAL_TABLET | Freq: Once | ORAL | Status: AC
  Administered 2024-02-17: 12.5 mg via ORAL
  Filled 2024-02-17: qty 1

## 2024-02-17 MED ORDER — ONDANSETRON HCL 4 MG/2ML IJ SOLN
4.0000 mg | Freq: Four times a day (QID) | INTRAMUSCULAR | Status: DC | PRN
  Filled 2024-02-17: qty 2

## 2024-02-17 MED ORDER — FENTANYL CITRATE (PF) 250 MCG/5ML IJ SOLN
INTRAMUSCULAR | Status: DC | PRN
  Administered 2024-02-17: 100 ug via INTRAVENOUS
  Administered 2024-02-17 (×4): 50 ug via INTRAVENOUS
  Administered 2024-02-17: 100 ug via INTRAVENOUS
  Administered 2024-02-17: 150 ug via INTRAVENOUS
  Administered 2024-02-17 (×2): 100 ug via INTRAVENOUS
  Administered 2024-02-17: 200 ug via INTRAVENOUS

## 2024-02-17 MED ORDER — 0.9 % SODIUM CHLORIDE (POUR BTL) OPTIME
TOPICAL | Status: DC | PRN
  Administered 2024-02-17: 6000 mL

## 2024-02-17 MED ORDER — MIDAZOLAM HCL 2 MG/2ML IJ SOLN: 2.0000 mg | INTRAMUSCULAR | Status: DC | PRN

## 2024-02-17 MED ORDER — ONDANSETRON HCL 4 MG/2ML IJ SOLN
INTRAMUSCULAR | Status: DC | PRN
  Administered 2024-02-17: 4 mg via INTRAVENOUS

## 2024-02-17 MED ORDER — PROTAMINE SULFATE 10 MG/ML IV SOLN
INTRAVENOUS | Status: AC
  Filled 2024-02-17: qty 25

## 2024-02-17 MED ORDER — ALBUMIN HUMAN 5 % IV SOLN
INTRAVENOUS | Status: DC | PRN
Start: 2024-02-17 — End: 2024-02-17

## 2024-02-17 MED ORDER — HEMOSTATIC AGENTS (NO CHARGE) OPTIME
TOPICAL | Status: DC | PRN
  Administered 2024-02-17 (×2): 1 via TOPICAL

## 2024-02-17 MED ORDER — MORPHINE SULFATE (PF) 2 MG/ML IV SOLN
1.0000 mg | INTRAVENOUS | Status: DC | PRN
  Administered 2024-02-17: 2 mg via INTRAVENOUS
  Administered 2024-02-17: 4 mg via INTRAVENOUS
  Filled 2024-02-17: qty 1
  Filled 2024-02-17: qty 2

## 2024-02-17 MED ORDER — EPINEPHRINE HCL 5 MG/250ML IV SOLN IN NS: 0.0000 ug/min | INTRAVENOUS | Status: DC

## 2024-02-17 MED ORDER — PHENYLEPHRINE HCL-NACL 20-0.9 MG/250ML-% IV SOLN: 0.0000 ug/min | INTRAVENOUS | Status: DC

## 2024-02-17 MED ORDER — PLASMA-LYTE A IV SOLN: INTRAVENOUS | Status: DC | PRN

## 2024-02-17 MED ORDER — ORAL CARE MOUTH RINSE
15.0000 mL | Freq: Three times a day (TID) | OROMUCOSAL | Status: DC
  Administered 2024-02-17 – 2024-02-21 (×14): 15 mL via OROMUCOSAL

## 2024-02-17 SURGICAL SUPPLY — 81 items
ADAPTER CARDIO PERF ANTE/RETRO (ADAPTER) ×3 IMPLANT
ADAPTER DLP PERFUSION .25INX2I (MISCELLANEOUS) IMPLANT
APPLICATOR PREVELEAK SEALANT (TIP) IMPLANT
APPLICATOR TIP COSEAL (VASCULAR PRODUCTS) IMPLANT
BAG DECANTER FOR FLEXI CONT (MISCELLANEOUS) ×3 IMPLANT
BLADE CLIPPER SURG (BLADE) ×3 IMPLANT
BLADE STERNUM SYSTEM 6 (BLADE) ×3 IMPLANT
BLADE SURG 15 STRL LF DISP TIS (BLADE) ×3 IMPLANT
CANISTER SUCTION 3000ML PPV (SUCTIONS) ×3 IMPLANT
CANNULA AORTIC ROOT 9FR (CANNULA) ×3 IMPLANT
CANNULA ARTERIAL NVNT 3/8 20FR (MISCELLANEOUS) IMPLANT
CANNULA ARTERIAL VENT 3/8 20FR (CANNULA) IMPLANT
CANNULA GUNDRY RCSP 15FR (MISCELLANEOUS) ×3 IMPLANT
CANNULA MC2 2 STG 36/46 NON-V (CANNULA) IMPLANT
CANNULA SUMP PERICARDIAL (CANNULA) IMPLANT
CATH HEART VENT LEFT (CATHETERS) ×3 IMPLANT
CATH ROBINSON RED A/P 18FR (CATHETERS) ×9 IMPLANT
CATH THORACIC 36FR (CATHETERS) ×3 IMPLANT
CATH THORACIC 36FR RT ANG (CATHETERS) ×3 IMPLANT
CAUTERY SURG HI TEMP FINE TIP (MISCELLANEOUS) IMPLANT
CNTNR URN SCR LID CUP LEK RST (MISCELLANEOUS) ×3 IMPLANT
CONTAINER PROTECT SURGISLUSH (MISCELLANEOUS) ×3 IMPLANT
DRAPE WARM FLUID 44X44 (DRAPES) IMPLANT
DRSG COVADERM 4X14 (GAUZE/BANDAGES/DRESSINGS) ×3 IMPLANT
ELECT CAUTERY BLADE 6.4 (BLADE) ×3 IMPLANT
ELECTRODE REM PT RTRN 9FT ADLT (ELECTROSURGICAL) ×6 IMPLANT
FELT TEFLON 1X6 (MISCELLANEOUS) ×3 IMPLANT
GAUZE 4X4 16PLY ~~LOC~~+RFID DBL (SPONGE) ×3 IMPLANT
GAUZE SPONGE 4X4 12PLY STRL (GAUZE/BANDAGES/DRESSINGS) ×3 IMPLANT
GLOVE BIO SURGEON STRL SZ 6 (GLOVE) IMPLANT
GLOVE BIO SURGEON STRL SZ 6.5 (GLOVE) IMPLANT
GLOVE BIO SURGEON STRL SZ7 (GLOVE) IMPLANT
GLOVE BIO SURGEON STRL SZ7.5 (GLOVE) IMPLANT
GLOVE SURG MICRO LTX SZ7 (GLOVE) ×6 IMPLANT
GOWN STRL REUS W/ TWL LRG LVL3 (GOWN DISPOSABLE) ×12 IMPLANT
GOWN STRL REUS W/ TWL XL LVL3 (GOWN DISPOSABLE) ×3 IMPLANT
GRAFT HEMASHIELD 30X10 (Vascular Products) IMPLANT
HEMOSTAT POWDER SURGIFOAM 1G (HEMOSTASIS) ×9 IMPLANT
HEMOSTAT SURGICEL 2X14 (HEMOSTASIS) ×3 IMPLANT
INSERT FOGARTY SM (MISCELLANEOUS) IMPLANT
INSERT FOGARTY XLG (MISCELLANEOUS) IMPLANT
KIT BASIN OR (CUSTOM PROCEDURE TRAY) ×3 IMPLANT
KIT SUCTION CATH 14FR (SUCTIONS) ×3 IMPLANT
KIT TURNOVER KIT B (KITS) ×3 IMPLANT
LINE VENT (MISCELLANEOUS) IMPLANT
NS IRRIG 1000ML POUR BTL (IV SOLUTION) ×15 IMPLANT
PACK E OPEN HEART (SUTURE) ×3 IMPLANT
PACK OPEN HEART (CUSTOM PROCEDURE TRAY) ×3 IMPLANT
PAD ARMBOARD POSITIONER FOAM (MISCELLANEOUS) ×6 IMPLANT
POSITIONER HEAD DONUT 9IN (MISCELLANEOUS) ×3 IMPLANT
SEALANT HEMOST PREVELEAK 4ML (HEMOSTASIS) IMPLANT
SEALANT SURG COSEAL 8ML (VASCULAR PRODUCTS) IMPLANT
SET MPS 3-ND DEL (MISCELLANEOUS) IMPLANT
SET VEIN GRAFT PERF (SET/KITS/TRAYS/PACK) IMPLANT
SPONGE T-LAP 18X18 ~~LOC~~+RFID (SPONGE) IMPLANT
SUT BONE WAX W31G (SUTURE) ×3 IMPLANT
SUT EB EXC GRN/WHT 2-0 V-5 (SUTURE) ×6 IMPLANT
SUT ETHIBON EXCEL 2-0 V-5 (SUTURE) IMPLANT
SUT ETHIBOND V-5 VALVE (SUTURE) IMPLANT
SUT PROLENE 3 0 SH 1 (SUTURE) ×3 IMPLANT
SUT PROLENE 3 0 SH 48 (SUTURE) ×6 IMPLANT
SUT PROLENE 3 0 SH DA (SUTURE) IMPLANT
SUT PROLENE 4 0 SH DA (SUTURE) IMPLANT
SUT PROLENE 4-0 RB1 .5 CRCL 36 (SUTURE) ×12 IMPLANT
SUT PROLENE 5 0 C 1 36 (SUTURE) IMPLANT
SUT SILK 2 0 SH CR/8 (SUTURE) IMPLANT
SUT STEEL 6MS V (SUTURE) IMPLANT
SUT STEEL STERNAL CCS#1 18IN (SUTURE) IMPLANT
SUT STEEL SZ 6 DBL 3X14 BALL (SUTURE) IMPLANT
SUT VIC AB 1 CTX36XBRD ANBCTR (SUTURE) ×6 IMPLANT
SUT VIC AB 2-0 CT1 TAPERPNT 27 (SUTURE) IMPLANT
SUT VIC AB 3-0 X1 27 (SUTURE) IMPLANT
SYSTEM SAHARA CHEST DRAIN ATS (WOUND CARE) ×3 IMPLANT
TAPE CLOTH SURG 4X10 WHT LF (GAUZE/BANDAGES/DRESSINGS) IMPLANT
TOWEL GREEN STERILE (TOWEL DISPOSABLE) ×3 IMPLANT
TOWEL GREEN STERILE FF (TOWEL DISPOSABLE) ×3 IMPLANT
TUBE CONNECTING 20X1/4 (TUBING) IMPLANT
UNDERPAD 30X36 HEAVY ABSORB (UNDERPADS AND DIAPERS) ×3 IMPLANT
VALVE AORTIC KONECT RESILIA 25 (Valve) IMPLANT
WATER STERILE IRR 1000ML POUR (IV SOLUTION) ×6 IMPLANT
YANKAUER SUCT BULB TIP NO VENT (SUCTIONS) IMPLANT

## 2024-02-17 NOTE — Op Note (Addendum)
 CARDIOVASCULAR SURGERY OPERATIVE NOTE  02/17/2024  Surgeon:  Dorise LOIS Fellers, MD  First Assistant: Kyla Donald,  PA-C: An experienced assistant was required given the complexity of this surgery and the standard of surgical care. The assistant was needed for exposure, dissection, suctioning, retraction of delicate tissues and sutures, instrument exchange and for overall help during this procedure.    Preoperative Diagnosis:  Moderate bicuspid aortic valve stenosis with 5.1 cm ascending aortic aneurysm   Postoperative Diagnosis:  Same   Procedure:  Median Sternotomy Extracorporeal circulation 3.   Replacement of the ascending aorta (hemi-arch) using a 30 mm Hemashield graft under deep hypothermic circulatory arrest 4.   Biological Bentall Procedure using a 25 mm Edwards KONECT RESILIA pericardial valved conduit with reimplantation of right and left coronary arteries.  Anesthesia:  General Endotracheal   Clinical History/Surgical Indication:    This 63 year old gentleman has stage C asymptomatic severe low-flow/low gradient bicuspid aortic valve stenosis and a 5.1 cm fusiform ascending aortic aneurysm.  I have personally reviewed his 2D echocardiogram and CTA studies.  His echocardiogram shows a severely calcified aortic valve which looks bicuspid to me.  The mean gradient was only 25 mmHg but the valve area was measured at 0.71 cm and the stroke-volume index was low.  It looks like a severely stenotic valve with poor leaflet mobility.  CTA of the chest shows some enlargement of the aortic root which I measure at 4.3 cm with a high takeoff of the left main and the ascending aorta has fusiform enlargement to 5.1 cm.  It decreases back towards normal size at the takeoff of the innominate artery.  Cardiac cath showed mild non-obstructive coronary artery disease. I think the best treatment for this is replacement of the aortic valve and ascending aortic aneurysm by Bentall procedure using  a period of deep hypothermic circulatory arrest to replace the ascending aorta up to the takeoff of the innominate artery with hemiarch replacement.  I reviewed the echo and CT images with the patient and his wife and discussed the surgical procedure.  All their questions have been answered at this time.  We discussed the pros and cons of mechanical and bioprosthetic valves and I have recommended a bioprosthetic valve given his age of 63 and likelihood of further degenerative arthritis requiring anti-inflammatory agents. I discussed the operative procedure with the patient and his wife including alternatives, benefits and risks; including but not limited to bleeding, blood transfusion, infection, stroke, myocardial infarction, graft failure, heart block requiring a permanent pacemaker, organ dysfunction, and death.  Oneil KATHEE Bers understands and agrees to proceed.    Preparation:  The patient was seen in the preoperative holding area and the correct patient, correct operation were confirmed with the patient after reviewing the medical record and catheterization. The consent was signed by me. Preoperative antibiotics were given. A pulmonary arterial line and left brachial arterial line were placed by the anesthesia team. The patient was taken back to the operating room and positioned supine on the operating room table. After being placed under general endotracheal anesthesia by the anesthesia team a foley catheter was placed. The neck, chest, abdomen, and both legs were prepped with betadine  soap and solution and draped in the usual sterile manner. A surgical time-out was taken and the correct patient and operative procedure were confirmed with the nursing and anesthesia staff.  TEE:  Performed by Dr. Alphonza Needle. This showed a bicuspid aortic valve with moderate AS. LV and RV systolic function were normal.  Cardiopulmonary Bypass:  A median sternotomy was performed. The pericardium was opened in  the midline. Right ventricular function appeared normal. The ascending aorta was aneurysmal. The patient was fully systemically heparinized and the ACT was maintained > 400 sec. The mid ascending aorta was cannulated with a 20 F aortic cannula for arterial inflow. Venous cannulation was performed via the right atrial appendage using a two-staged venous cannula. A temperature probe was inserted into the interventricular septum and an insulating pad was placed in the pericardium. CO2 was insufflated into the pericardium throughout the case to minimize intracardiac air.    Resection and grafting of ascending aortic aneurysm: Assisted by Kyla Donald, PA-C  The patient was placed on cardiopulmonary bypass and a left ventricular vent was placed via the right superior pulmonary vein. Systemic cooling was begun with a goal temperature of 22 degrees centigrade by bladder and venous return temperature probes. A retrograde cardioplegia cannula was placed through the right atrium into the coronary sinus without difficulty. A retrograde cerebral perfusion cannula was placed into the SVC through a pursestring suture and the SVC was encircled with a silastic tape. After 20 minutes of cooling the target temperature of 22 degrees centigrade was reached. Cerebral oximetry was 70% bilaterally. BIS was zero. The patient was given Propofol  and 125 mg of Solumedrol. The head was packed in ice. The bed was placed in steep trendelenburg. Circulatory arrest was begun and the blood volume emptied into the venous reservoir.  Cold retrograde KBC cardioplegia was given and myocardial temperature dropped to 10 degrees centigrade. Additional doses were given directly into the coronary ostia using a handheld Spencer cannula at approximately 60 minute intervals throughout the period of circulatory arrest and cross-clamping.Continuous retrograde cerebral perfusion was begun after the cardioplegia finished and the SVC occluded with the  silastic tape. Complete diastolic arrest was maintained. The aortic cannula was removed. The aorta was transected just proximal to the innominate artery beveling the resection out along the undersurface of the aortic arch (Hemiarch replacement). The aortic diameter was measured at 30 mm here. A 30 x 10 mm Hemasheild Platinum vascular graft was prepared. ( REF # [id number] P0, Lot [id number], SN [id number]). It was anastomosed to the aortic arch in an end to end manner using 3-0 prolene continuous suture with a felt strip to reinforce the anastomisis. A light coating of Prevaleak was applied to seal needle holes. The arterial end of the bypass circuit was then connected to the 10mm side arm graft and circulation was slowly resumed. The tape was removed from the SVC and retrograde cerebral stopped. The aortic graft was cross-clamped proximal to the side arm graft and full CPB support was resumed. Circulatory arrest time was 22 minutes. Retrograde cerebral perfusion time was 10 minutes.   Bentall Procedure: Assisted by Donielle Zimmerman, PA-C.   The ascending aorta was mobilized from the right pulmonary artery and main PA. It was opened longitudinally and the valve inspected. It was a bicuspid valve with fusion of the left and right cusps with a single raphe with 3 commissures. The right and left coronary arteries were removed from the aortic root with a button of aortic wall around the ostia. The left and right coronary ostia was immediately adjacent to the annulus. They were retracted carefully out of the way with stay sutures to prevent rotation. The native valve was excised taking care to remove all particulate debri. The annulus was decalcified with rongeurs. The annulus was sized and a 25 mm [employer]  pericardial valved graft was chosen. ( Model # [id number], Serial # [id number]). A series of pledgetted 2-0 Ethibond horizontal mattress sutures were placed around the annulus with the pledgets in a  sub-annular position. The sutures were placed through  the valve sewing ring. The valve was lowered into place and the sutures tied . The valve seated nicely.  Small openings were made in the graft for the coronary anastomoses using a thermal cautery. Then the left and right coronary buttons were anastomosed to the graft in an end to side manner using continuous 5-0 prolene suture. A light coating of Prevaleak was applied to each anastomosis for hemostasis. The two grafts were then cut to the appropriate length and anastomosed end to end using continuous 3-0 prolene suture. Prevaleak was applied to seal the needle holes in the grafts. A vent cannula was placed into the graft to remove any air. Deairing maneuvers were performed and the bed placed in trendelenburg position.   Completion:   The patient was rewarmed to 37 degrees Centigrade. The crossclamp was removed with a time of 137 minutes. There was spontaneous return of sinus rhythm. The position of the grafts was satisfactory. The vascular anastomoses all appeared hemostatic. Two temporary epicardial pacing wires were placed on the right atrium and two on the right ventricle. The patient was weaned from CPB without difficulty on no inotropic agents.  CPB time was 206 minutes. Cardiac output was 5 LPM. TEE showed a normal functioning aortic valve prosthesis with no AI. There was no MR.  LV function appeared normal. Heparin  was fully reversed with protamine  and the venous cannula removed. The aortic side arm graft was ligated with a heavy silk tie and suture ligated with a 3-0 Prolene pledgetted horizontal mattress suture. Hemostasis was achieved. Mediastinal  drainage tubes were placed. The sternum was closed with double #6 stainless steel wires. The fascia was closed with continuous # 1 vicryl suture. The subcutaneous tissue was closed with 2-0 vicryl continuous suture. The skin was closed with 3-0 vicryl subcuticular suture. All sponge, needle, and  instrument counts were reported correct at the end of the case. Dry sterile dressings were placed over the incisions and around the chest tubes which were connected to pleurevac suction. The patient was then transported to the surgical intensive care unit in stable condition.

## 2024-02-17 NOTE — Transfer of Care (Signed)
 Immediate Anesthesia Transfer of Care Note  Patient: Chase Lowe  Procedure(s) Performed: BENTALL PROCEDURE USING KONECT RESILIA AORTIC VALVED CONDUIT (Chest) REPLACEMENT, AORTA, ASCENDING USING HEMASHIED VASCULAR GRAFT ECHOCARDIOGRAM, TRANSESOPHAGEAL, INTRAOPERATIVE  Patient Location: ICU  Anesthesia Type:General  Level of Consciousness: sedated and Patient remains intubated per anesthesia plan  Airway & Oxygen Therapy: Patient remains intubated per anesthesia plan and Patient placed on Ventilator (see vital sign flow sheet for setting)  Post-op Assessment: Report given to RN and Post -op Vital signs reviewed and stable  Post vital signs: Reviewed and stable  Last Vitals:  Vitals Value Taken Time  BP 95/59 02/17/24 13:50  Temp 35 C 02/17/24 13:59  Pulse 78 02/17/24 13:59  Resp 16 02/17/24 13:59  SpO2 100 % 02/17/24 13:59  Vitals shown include unfiled device data.  Last Pain:  Vitals:   02/17/24 0621  TempSrc:   PainSc: 0-No pain         Complications: No notable events documented.

## 2024-02-17 NOTE — Hospital Course (Addendum)
 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  He was noted to have a heart murmur and LVH on his preoperative ECG and an echocardiogram on 11/04/2023 showed a moderately calcified aortic valve with an indeterminate number of leaflets.  There is felt to be moderate aortic stenosis with a mean gradient of 25 mmHg and a peak gradient of 45.5 mmHg.  Aortic valve area by VTI was 0.71 cm.  Dimensionless index was 0.29.  Stroke-volume index was low at 26.  Left ventricular ejection fraction was 55 to 60% with mild LVH.  The aorta was noted to be dilated to 47 mm.  CT of the chest on 11/21/2023 showed fusiform aneurysmal enlargement of the ascending aorta to 5.1 cm.  Dr. Lucas thought the best treatment for this is replacement of the aortic valve and ascending aortic aneurysm by Bentall procedure using a period of deep hypothermic circulatory arrest to replace the ascending aorta up to the takeoff of the innominate artery with hemiarch replacement. Potential risks, benefits, and complications of the surgery were discussed with the patient and he agreed to proceed with surgery.  Hospital Course: Patient underwent a replacement of the ascending thoracic aorta, Bentall with deep hypothermic circulatory arrest. He was transported from the OR to [facility] ICU in stable condition. He was extubated the evening of surgery.  He was maintaining NSR and did not require support with pressors/intropes.  His arterial lines, chest tubes, and swan ganz catheter were removed without difficulty.  He was started on Lasix  to help facilitate diuresis.  He responded well and diuretics were discontinued.  His pacing wires were removed without difficulty.  He was felt stable for transfer to the progressive care unit on 02/19/2024.

## 2024-02-17 NOTE — Anesthesia Procedure Notes (Signed)
 Central Venous Catheter Insertion Performed by: Epifanio Fallow, MD, anesthesiologist Start/End8/15/2025 6:45 AM, 02/17/2024 7:00 AM Patient location: Pre-op. Preanesthetic checklist: patient identified, IV checked, site marked, risks and benefits discussed, surgical consent, monitors and equipment checked, pre-op evaluation, timeout performed and anesthesia consent Hand hygiene performed  and maximum sterile barriers used  PA cath was placed.Swan type:thermodilution PA Cath depth:50 Procedure performed without using ultrasound guided technique. Attempts: 1 Patient tolerated the procedure well with no immediate complications.

## 2024-02-17 NOTE — Anesthesia Procedure Notes (Signed)
 Central Venous Catheter Insertion Performed by: Epifanio Fallow, MD, anesthesiologist Start/End8/15/2025 6:45 AM, 02/17/2024 7:00 AM Patient location: Pre-op. Preanesthetic checklist: patient identified, IV checked, site marked, risks and benefits discussed, surgical consent, monitors and equipment checked, pre-op evaluation, timeout performed and anesthesia consent Position: Trendelenburg Lidocaine  1% used for infiltration and patient sedated Hand hygiene performed , maximum sterile barriers used  and Seldinger technique used Catheter size: 9 Fr Total catheter length 10. Central line was placed.MAC introducer Procedure performed using ultrasound guided technique. Ultrasound Notes:anatomy identified, needle tip was noted to be adjacent to the nerve/plexus identified, no ultrasound evidence of intravascular and/or intraneural injection and image(s) printed for medical record Attempts: 1 Following insertion, line sutured, dressing applied and Biopatch. Post procedure assessment: blood return through all ports, free fluid flow and no air  Patient tolerated the procedure well with no immediate complications.

## 2024-02-17 NOTE — Anesthesia Postprocedure Evaluation (Signed)
 Anesthesia Post Note  Patient: Chase Lowe  Procedure(s) Performed: BENTALL PROCEDURE USING KONECT RESILIA AORTIC VALVED CONDUIT (Chest) REPLACEMENT, AORTA, ASCENDING USING HEMASHIED VASCULAR GRAFT ECHOCARDIOGRAM, TRANSESOPHAGEAL, INTRAOPERATIVE     Patient location during evaluation: SICU Anesthesia Type: General Level of consciousness: sedated Pain management: pain level controlled Vital Signs Assessment: post-procedure vital signs reviewed and stable Respiratory status: patient remains intubated per anesthesia plan Cardiovascular status: stable Postop Assessment: no apparent nausea or vomiting Anesthetic complications: no   No notable events documented.  Last Vitals:  Vitals:   02/17/24 1630 02/17/24 1645  BP:    Pulse: 80 80  Resp: 13 16  Temp: (!) 35.1 C (!) 35.3 C  SpO2: 100% 100%    Last Pain:  Vitals:   02/17/24 1600  TempSrc: Core  PainSc:                  Karan Ramnauth,W. EDMOND

## 2024-02-17 NOTE — Anesthesia Procedure Notes (Signed)
 Arterial Line Insertion Start/End8/15/2025 7:05 AM, 02/17/2024 7:15 AM Performed by: Epifanio Fallow, MD, anesthesiologist  Patient location: Pre-op. Preanesthetic checklist: patient identified, IV checked, site marked, risks and benefits discussed, surgical consent, monitors and equipment checked, pre-op evaluation, timeout performed and anesthesia consent Lidocaine  1% used for infiltration Left, brachial was placed Catheter size: 20 G Hand hygiene performed , maximum sterile barriers used  and Seldinger technique used  Attempts: 1 Procedure performed using ultrasound guided technique. Ultrasound Notes:anatomy identified, needle tip was noted to be adjacent to the nerve/plexus identified, no ultrasound evidence of intravascular and/or intraneural injection and image(s) printed for medical record Following insertion, dressing applied, Biopatch and line sutured. Post procedure assessment: normal and unchanged  Post procedure complications: local hematoma, unsuccessful attempts and second provider assisted. Patient tolerated the procedure well with no immediate complications.

## 2024-02-17 NOTE — Progress Notes (Signed)
 Patient ID: Chase Lowe, male   DOB: May 06, 1961, 63 y.o.   MRN: [id number]   TCTS Evening Rounds:   Hemodynamically stable  CI = 2.9  Has started to wake up on vent. Moves all extremities to command   Urine output good  CT output low  CBC    Component Value Date/Time   WBC 7.6 02/17/2024 1357   RBC 3.23 (L) 02/17/2024 1357   HGB 9.9 (L) 02/17/2024 1401   HGB 13.8 12/26/2023 1605   HCT 29.0 (L) 02/17/2024 1401   HCT 41.6 12/26/2023 1605   PLT 87 (L) 02/17/2024 1357   PLT 147 (L) 12/26/2023 1605   MCV 89.5 02/17/2024 1357   MCV 92 12/26/2023 1605   MCH 30.7 02/17/2024 1357   MCHC 34.3 02/17/2024 1357   RDW 12.9 02/17/2024 1357   RDW 12.4 12/26/2023 1605   LYMPHSABS 2.5 09/07/2023 0826   MONOABS 0.3 09/07/2023 0826   EOSABS 0.2 09/07/2023 0826   BASOSABS 0.0 09/07/2023 0826     BMET    Component Value Date/Time   NA 141 02/17/2024 1401   NA 140 12/26/2023 1605   K 4.1 02/17/2024 1401   CL 106 02/17/2024 1232   CO2 25 02/14/2024 1200   GLUCOSE 131 (H) 02/17/2024 1232   BUN 13 02/17/2024 1232   BUN 15 12/26/2023 1605   CREATININE 0.70 02/17/2024 1232   CALCIUM 10.0 02/14/2024 1200   EGFR 86 12/26/2023 1605   GFRNONAA >60 02/14/2024 1200     A/P:  Stable postop course. Continue current plans. Warming up and then will be able to wean vent.

## 2024-02-17 NOTE — Procedures (Signed)
 Extubation Procedure Note  Patient Details:   Name: Chase Lowe DOB: [birth year] MRN: [id number]   Airway Documentation:    Vent end date: 02/17/24 Vent end time: 1805   Evaluation  O2 sats: stable throughout Complications: No apparent complications Patient did tolerate procedure well. Bilateral Breath Sounds: Clear, Diminished   Yes  PT was extubated per rapid wean protocol and placed on 3 L [place]. VC was 2.8 L and NIF -18 (bad pt effort despite multiple attempts). MD made aware. Pt is stable at this time.   Taliana Mersereau 02/17/2024, 6:09 PM

## 2024-02-17 NOTE — Interval H&P Note (Signed)
 History and Physical Interval Note:  02/17/2024 6:56 AM  Chase Lowe  has presented today for surgery, with the diagnosis of TAA AS.  The various methods of treatment have been discussed with the patient and family. After consideration of risks, benefits and other options for treatment, the patient has consented to  Procedure(s) with comments: BENTALL PROCEDURE (N/A) - CIRC ARREST REPLACEMENT, AORTA, ASCENDING (N/A) ECHOCARDIOGRAM, TRANSESOPHAGEAL, INTRAOPERATIVE (N/A) as a surgical intervention.  The patient's history has been reviewed, patient examined, no change in status, stable for surgery.  I have reviewed the patient's chart and labs.  Questions were answered to the patient's satisfaction.     Chase Lowe

## 2024-02-17 NOTE — Anesthesia Procedure Notes (Signed)
 Procedure Name: Intubation Date/Time: 02/17/2024 7:53 AM  Performed by: Claudene Arlin LABOR, CRNAPre-anesthesia Checklist: Patient identified, Emergency Drugs available, Suction available and Patient being monitored Patient Re-evaluated:Patient Re-evaluated prior to induction Oxygen Delivery Method: Circle system utilized Preoxygenation: Pre-oxygenation with 100% oxygen Induction Type: IV induction Ventilation: Oral airway inserted - appropriate to patient size, Two handed mask ventilation required and Mask ventilation without difficulty Laryngoscope Size: Glidescope and 4 Grade View: Grade I Tube type: Oral Tube size: 8.0 mm Number of attempts: 1 Airway Equipment and Method: Stylet, Rigid stylet, Video-laryngoscopy and Oral airway Placement Confirmation: ETT inserted through vocal cords under direct vision, positive ETCO2 and breath sounds checked- equal and bilateral Secured at: 23 cm Tube secured with: Tape Dental Injury: Teeth and Oropharynx as per pre-operative assessment  Difficulty Due To: Difficulty was anticipated and Difficult Airway- due to limited oral opening

## 2024-02-17 NOTE — Discharge Summary (Signed)
 [address] [place] [address]             [phone]     Physician Discharge Summary  Patient ID: Chase Lowe MRN: [id number] DOB/AGE: 1961/06/01 63 y.o.  Admit date: 02/17/2024 Discharge date: 02/21/2024  Admission Diagnoses:  Patient Active Problem List   Diagnosis Date Noted   Aortic stenosis due to bicuspid aortic valve 02/17/2024   Thoracic aortic aneurysm (TAA) (HCC) 12/01/2023   S/P revision of total knee, right 11/18/2022   S/P cervical spinal fusion 06/30/2017   Discharge Diagnoses:  Patient Active Problem List   Diagnosis Date Noted   S/P AVR (aortic valve replacement) 02/18/2024   S/P ascending aortic replacement 02/18/2024   Aortic stenosis due to bicuspid aortic valve 02/17/2024   Thoracic aortic aneurysm (TAA) (HCC) 12/01/2023   S/P revision of total knee, right 11/18/2022   S/P cervical spinal fusion 06/30/2017   Discharged Condition: stable  HPI: The patient is a 63 year old gentleman with a history of hypertension, hyperlipidemia, and 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  He was noted to have a heart murmur and LVH on his preoperative ECG and an echocardiogram on 11/04/2023 showed a moderately calcified aortic valve with an indeterminate number of leaflets.  There is felt to be moderate aortic stenosis with a mean gradient of 25 mmHg and a peak gradient of 45.5 mmHg.  Aortic valve area by VTI was 0.71 cm.  Dimensionless index was 0.29.  Stroke-volume index was low at 26.  Left ventricular ejection fraction was 55 to 60% with mild LVH.  The aorta was noted to be dilated to 47 mm.  CT of the chest on 11/21/2023 showed fusiform aneurysmal enlargement of the ascending aorta to 5.1 cm.  Dr. Lucas thought the best treatment for this is replacement of the aortic valve and ascending aortic aneurysm by Bentall  procedure using a period of deep hypothermic circulatory arrest to replace the ascending aorta up to the takeoff of the innominate artery with hemiarch replacement. Potential risks, benefits, and complications of the surgery were discussed with the patient and he agreed to proceed with surgery.  Hospital Course: Patient underwent a replacement of the ascending thoracic aorta, Bentall with deep hypothermic circulatory arrest. He was transported from the OR to [facility] ICU in stable condition. He was extubated the evening of surgery.  He was maintaining NSR and did not require support with pressors/intropes.  His arterial lines, chest tubes, and swan ganz catheter were removed without difficulty.  He was started on Lasix  to help facilitate diuresis.  He responded well and diuretics were discontinued.  His pacing wires were removed without difficulty.  He was felt stable for transfer to the progressive care unit on 02/19/2024.  His heart rate was elevated in the 90s.  He was started on Lopressor  for additional HR control.  He is ambulating independently.  His surgical incisions are healing without evidence of control.  He is stable for discharge home today.  Consults: None  Significant Diagnostic Studies:   CTA Chest:  IMPRESSION:   5.1 cm ascending thoracic aortic aneurysm.   DOSE REDUCTION: This exam was performed according to our departmental dose-optimization program which includes automated exposure control, adjustment of the mA and/or kV according to patient size and/or use of iterative reconstruction technique.   Electronically signed  by: [place] Engel MD 11/22/2023 06:56 AM EDT RP Workstation: [id number]  ECHO:  IMPRESSIONS     1. The aortic valve is abnormal with indeterminant number of cusps.  Unable to determine aortic valve morphology due to image quality. There is  moderate calcification of the aortic valve. Aortic valve regurgitation is  not visualized. Moderate aortic  valve  stenosis. Aortic valve area, by VTI measures 0.71 cm. Aortic valve  mean gradient measures 25.0 mmHg. Aortic valve Vmax measures 3.37 m/s.   2. Aortic dilatation noted. Aneurysm of the ascending aorta, measuring 47  mm. There is moderate dilatation of the ascending aorta.   3. Left ventricular ejection fraction, by estimation, is 55 to 60%. Left  ventricular ejection fraction by 3D volume is 57 %. The left ventricle has  normal function. The left ventricle has no regional wall motion  abnormalities. There is mild left  ventricular hypertrophy. Left ventricular diastolic parameters were  normal. The average left ventricular global longitudinal strain is -18.3  %. The global longitudinal strain is normal.   4. Normal right ventricular free wall strain, -28.1%. . Right ventricular  systolic function is normal. The right ventricular size is mildly  enlarged. Tricuspid regurgitation signal is inadequate for assessing PA  pressure.   5. The mitral valve is grossly normal. Mild mitral valve regurgitation.  No evidence of mitral stenosis.   6. The inferior vena cava is normal in size with greater than 50%  respiratory variability, suggesting right atrial pressure of 3 mmHg.   Treatments: surgery:      CARDIOVASCULAR SURGERY OPERATIVE NOTE   02/17/2024   Surgeon:  Dorise LOIS Fellers, MD   First Assistant: Kyla Donald,  PA-C: An experienced assistant was required given the complexity of this surgery and the standard of surgical care. The assistant was needed for exposure, dissection, suctioning, retraction of delicate tissues and sutures, instrument exchange and for overall help during this procedure.     Preoperative Diagnosis:  Moderate bicuspid aortic valve stenosis with 5.1 cm ascending aortic aneurysm    Postoperative Diagnosis:  Same     Procedure:   Median Sternotomy Extracorporeal circulation 3.   Replacement of the ascending aorta (hemi-arch) using a 30 mm Hemashield graft  under deep hypothermic circulatory arrest 4.   Biological Bentall Procedure using a 25 mm Edwards KONECT RESILIA pericardial valved conduit with reimplantation of right and left coronary arteries.       Discharge Exam: Performed by Dr. Fellers Blood pressure 106/73, pulse 89, temperature 98.4 F (36.9 C), temperature source Oral, resp. rate 15, height (P) 5' 11 (1.803 m), weight 82.6 kg, SpO2 97%. General appearance: alert and cooperative Neurologic: intact Heart: regular rate and rhythm Lungs: clear to auscultation bilaterally Extremities: edema mild Wound: dressing dry    Discharge Medications:  The patient has been discharged on:   1.Beta Blocker:  Yes [ X  ]                              No   [   ]                              If No, reason:  2.Ace Inhibitor/ARB: Yes [   ]  No  [ X   ]                                     If No, reason: titration of BB  3.Statin:   Yes [   ]                  No  [ x  ]                  If No, reason: No CAD  4.Arleene:  Yes  [ x  ]                  No   [   ]                  If No, reason:  Patient had ACS upon admission: No  Plavix/P2Y12 inhibitor: Yes [   ]                                      No  [ X  ]     Discharge Instructions     Amb Referral to Cardiac Rehabilitation   Complete by: As directed    Diagnosis: Valve Replacement   Valve: Aortic   After initial evaluation and assessments completed: Virtual Based Care may be provided alone or in conjunction with Phase 2 Cardiac Rehab based on patient barriers.: Yes   Intensive Cardiac Rehabilitation (ICR) [facility] location only OR Traditional Cardiac Rehabilitation (TCR) *If criteria for ICR are not met will enroll in TCR ([facility] only): Yes      Allergies as of 02/21/2024       Reactions   Aspirin  Other (See Comments)   High doses contraindicated due to ulcer - Can tolerate in small doses with food   Codeine Nausea And Vomiting    Oxycodone -acetaminophen  Nausea And Vomiting        Medication List     STOP taking these medications    lisinopril  5 MG tablet Commonly known as: ZESTRIL        TAKE these medications    acetaminophen  500 MG tablet Commonly known as: TYLENOL  Take 1-2 tablets (500-1,000 mg total) by mouth every 6 (six) hours as needed.   Aspirin  Low Dose 81 MG tablet Generic drug: aspirin  EC Take 1 tablet (81 mg total) by mouth daily. Swallow whole.   metoprolol  tartrate 25 MG tablet Commonly known as: LOPRESSOR  Take 1 tablet (25 mg total) by mouth 2 (two) times daily.   MULTIVITAMIN PO Take 1 tablet by mouth daily.   traMADol  50 MG tablet Commonly known as: ULTRAM  Take 1 tablet (50 mg total) by mouth every 4 (four) hours as needed for moderate pain (pain score 4-6).   valACYclovir  1000 MG tablet Commonly known as: VALTREX  Take 1 tablet (1,000 mg total) by mouth 2 (two) times daily.        Follow-up Information     [facility] Dept. of [facility] Follow up on 02/27/2024.   Specialty: Cardiothoracic Surgery Why: Appointment is at 11:30 for suture removal, For suture removal Contact information: [address] [place]  [address] [phone]        Lucas Dorise POUR, MD Follow up on 03/21/2024.  Specialty: Cardiothoracic Surgery Why: Appointment is at 10:00, please get CXR at 9:00 prior to your appointment on the 2nd floor of our office building Contact information: [address] [place] [address] [phone]         [facility] at [facility] A Dept. of [place] Follow up on 03/21/2024.   Specialty: Radiology Why: please get CXR at 9:00 on 2nd floor of our office building Contact information: [address] [place]  [address]        Rana Lum CROME, NP Follow up on 03/06/2024.   Specialty: Cardiology Why: Appointment is at 8:25 Contact  information: [address] [place] [address] [phone]                Signed:  Rocky Shad, PA-C 02/21/2024, 7:21 AM

## 2024-02-17 NOTE — Discharge Instructions (Signed)

## 2024-02-17 NOTE — Brief Op Note (Signed)
 02/17/2024  12:09 PM  PATIENT:  Chase Lowe  63 y.o. male  PRE-OPERATIVE DIAGNOSIS:  1. Ascending Thoracic Aortic Aneurysm 2. Bicuspid aortic valve  stenosis.  POST-OPERATIVE DIAGNOSIS:  1. Ascending Thoracic Aortic Aneurysm 2. Bicuspid aortic valve  stenosis.  PROCEDURE:  INTRAOPERATIVE TRANSESOPHAGEAL ECHOCARDIOGRAM, REPLACEMENT OF ASCENDING THORACIC AORTA USING HEMASHIED VASCULAR GRAFT, BENTALL PROCEDURE (USING KONECT RESILIA AORTIC VALVED CONDUIT Model # [id number], Serial # [id number], Size 25 MM)with CIRCULATORY ARREST  SURGEON:  Surgeons and Role:    Lucas Dorise POUR, MD - Primary  PHYSICIAN ASSISTANT: Kyla Donald PA-C  ASSISTANTS: Mariel Mayotte RNFA   ANESTHESIA:   general  EBL:  Per anesthesia, perfusion record  DRAINS: Chest tubes placed in the mediastinal and pleural spaces   SPECIMEN:  Source of Specimen:  Native AV leaflets  DISPOSITION OF SPECIMEN:  PATHOLOGY  COUNTS CORRECT:  YES  DICTATION: .Dragon Dictation  PLAN OF CARE: Admit to inpatient   PATIENT DISPOSITION:  ICU - intubated and hemodynamically stable.   Delay start of Pharmacological VTE agent (>24hrs) due to surgical blood loss or risk of bleeding: yes  BASELINE WEIGHT: 81.6 kg

## 2024-02-18 ENCOUNTER — Inpatient Hospital Stay (HOSPITAL_COMMUNITY)

## 2024-02-18 DIAGNOSIS — Z452 Encounter for adjustment and management of vascular access device: Secondary | ICD-10-CM | POA: Diagnosis not present

## 2024-02-18 DIAGNOSIS — Z952 Presence of prosthetic heart valve: Secondary | ICD-10-CM

## 2024-02-18 DIAGNOSIS — Z8679 Personal history of other diseases of the circulatory system: Secondary | ICD-10-CM | POA: Diagnosis not present

## 2024-02-18 DIAGNOSIS — J939 Pneumothorax, unspecified: Secondary | ICD-10-CM | POA: Diagnosis not present

## 2024-02-18 DIAGNOSIS — Z95828 Presence of other vascular implants and grafts: Secondary | ICD-10-CM

## 2024-02-18 DIAGNOSIS — R011 Cardiac murmur, unspecified: Secondary | ICD-10-CM | POA: Diagnosis not present

## 2024-02-18 LAB — BASIC METABOLIC PANEL WITH GFR
Anion gap: 7 (ref 5–15)
Anion gap: 9 (ref 5–15)
BUN: 12 mg/dL (ref 8–23)
BUN: 14 mg/dL (ref 8–23)
CO2: 22 mmol/L (ref 22–32)
CO2: 25 mmol/L (ref 22–32)
Calcium: 8.2 mg/dL — ABNORMAL LOW (ref 8.9–10.3)
Calcium: 8.5 mg/dL — ABNORMAL LOW (ref 8.9–10.3)
Chloride: 106 mmol/L (ref 98–111)
Chloride: 101 mmol/L (ref 98–111)
Creatinine, Ser: 0.73 mg/dL (ref 0.61–1.24)
Creatinine, Ser: 1.09 mg/dL (ref 0.61–1.24)
GFR, Estimated: >60 mL/min (ref >60)
GFR, Estimated: >60 mL/min (ref >60)
Glucose, Bld: 110 mg/dL — ABNORMAL HIGH (ref 70–99)
Glucose, Bld: 159 mg/dL — ABNORMAL HIGH (ref 70–99)
Potassium: 4.7 mmol/L (ref 3.5–5.1)
Potassium: 4 mmol/L (ref 3.5–5.1)
Sodium: 135 mmol/L (ref 135–145)
Sodium: 135 mmol/L (ref 135–145)

## 2024-02-18 LAB — BPAM CRYOPRECIPITATE
Blood Product Expiration Date: 202508182359
Blood Product Expiration Date: 202508182359
ISSUE DATE / TIME: 202508151252
ISSUE DATE / TIME: 202508151252
Unit Type and Rh: 5100
Unit Type and Rh: 6200

## 2024-02-18 LAB — BPAM PLATELET PHERESIS
Blood Product Expiration Date: 202508172359
ISSUE DATE / TIME: 202508151252
Unit Type and Rh: 5100

## 2024-02-18 LAB — MAGNESIUM
Magnesium: 2.5 mg/dL — ABNORMAL HIGH (ref 1.7–2.4)
Magnesium: 1.9 mg/dL (ref 1.7–2.4)

## 2024-02-18 LAB — PREPARE CRYOPRECIPITATE
Unit division: 0
Unit division: 0

## 2024-02-18 LAB — PREPARE PLATELET PHERESIS: Unit division: 0

## 2024-02-18 LAB — CBC
HCT: 29 % — ABNORMAL LOW (ref 39.0–52.0)
HCT: 28.6 % — ABNORMAL LOW (ref 39.0–52.0)
Hemoglobin: 9.8 g/dL — ABNORMAL LOW (ref 13.0–17.0)
Hemoglobin: 9.5 g/dL — ABNORMAL LOW (ref 13.0–17.0)
MCH: 30.2 pg (ref 26.0–34.0)
MCH: 30.3 pg (ref 26.0–34.0)
MCHC: 33.8 g/dL (ref 30.0–36.0)
MCHC: 33.2 g/dL (ref 30.0–36.0)
MCV: 89.2 fL (ref 80.0–100.0)
MCV: 91.1 fL (ref 80.0–100.0)
Platelets: 83 K/uL — ABNORMAL LOW (ref 150–400)
Platelets: 82 K/uL — ABNORMAL LOW (ref 150–400)
RBC: 3.25 MIL/uL — ABNORMAL LOW (ref 4.22–5.81)
RBC: 3.14 MIL/uL — ABNORMAL LOW (ref 4.22–5.81)
RDW: 13.1 % (ref 11.5–15.5)
RDW: 13.2 % (ref 11.5–15.5)
WBC: 7.9 K/uL (ref 4.0–10.5)
WBC: 8 K/uL (ref 4.0–10.5)
nRBC: 0 % (ref 0.0–0.2)
nRBC: 0 % (ref 0.0–0.2)

## 2024-02-18 LAB — ECHO INTRAOPERATIVE TEE
Height: 71 in
Weight: 2880 [oz_av]

## 2024-02-18 LAB — GLUCOSE, CAPILLARY
Glucose-Capillary: 101 mg/dL — ABNORMAL HIGH (ref 70–99)
Glucose-Capillary: 111 mg/dL — ABNORMAL HIGH (ref 70–99)
Glucose-Capillary: 112 mg/dL — ABNORMAL HIGH (ref 70–99)
Glucose-Capillary: 113 mg/dL — ABNORMAL HIGH (ref 70–99)
Glucose-Capillary: 114 mg/dL — ABNORMAL HIGH (ref 70–99)
Glucose-Capillary: 119 mg/dL — ABNORMAL HIGH (ref 70–99)
Glucose-Capillary: 121 mg/dL — ABNORMAL HIGH (ref 70–99)
Glucose-Capillary: 131 mg/dL — ABNORMAL HIGH (ref 70–99)

## 2024-02-18 MED ORDER — INSULIN ASPART 100 UNIT/ML IJ SOLN: 0.0000 [IU] | Freq: Three times a day (TID) | INTRAMUSCULAR | Status: DC

## 2024-02-18 MED ORDER — FUROSEMIDE 10 MG/ML IJ SOLN
40.0000 mg | Freq: Two times a day (BID) | INTRAMUSCULAR | Status: AC
  Administered 2024-02-18 (×2): 40 mg via INTRAVENOUS
  Filled 2024-02-18 (×2): qty 4

## 2024-02-18 MED ORDER — INSULIN ASPART 100 UNIT/ML IJ SOLN
0.0000 [IU] | INTRAMUSCULAR | Status: DC
  Administered 2024-02-18: 2 [IU] via SUBCUTANEOUS

## 2024-02-18 MED ORDER — VALACYCLOVIR HCL 500 MG PO TABS
1000.0000 mg | ORAL_TABLET | Freq: Two times a day (BID) | ORAL | Status: DC
  Administered 2024-02-18 – 2024-02-21 (×7): 1000 mg via ORAL
  Filled 2024-02-18 (×8): qty 2

## 2024-02-18 NOTE — Progress Notes (Signed)
 1 Day Post-Op Procedure(s) (LRB): BENTALL PROCEDURE USING KONECT RESILIA AORTIC VALVED CONDUIT (N/A) REPLACEMENT, AORTA, ASCENDING USING HEMASHIED VASCULAR GRAFT (N/A) ECHOCARDIOGRAM, TRANSESOPHAGEAL, INTRAOPERATIVE (N/A) Subjective: No complaints  Objective: Vital signs in last 24 hours: Temp:  [94.1 F (34.5 C)-97.9 F (36.6 C)] 97.3 F (36.3 C) (08/16 0700) Pulse Rate:  [61-86] 74 (08/16 0700) Cardiac Rhythm: Normal sinus rhythm (08/16 0405) Resp:  [9-21] 13 (08/16 0700) BP: (95-123)/(59-90) 95/59 (08/15 1350) SpO2:  [92 %-100 %] 98 % (08/16 0700) Arterial Line BP: (87-143)/(50-78) 131/58 (08/16 0700) FiO2 (%):  [40 %-50 %] 40 % (08/15 1740) Weight:  [75.8 kg] 75.8 kg (08/16 0500)  Hemodynamic parameters for last 24 hours: PAP: (13-29)/(3-20) 17/7 CVP:  [4 mmHg-11 mmHg] 7 mmHg CO:  [3.8 L/min-8.3 L/min] 6.5 L/min CI:  [1.9 L/min/m2-4.1 L/min/m2] 3.2 L/min/m2  Intake/Output from previous day: 08/15 0701 - 08/16 0700 In: 6924.1 [I.V.:2773.5; Blood:1030; IV Piggyback:3120.7] Out: 3705 [Urine:2325; Blood:915; Chest Tube:465] Intake/Output this shift: No intake/output data recorded.  General appearance: alert and cooperative Neurologic: intact Heart: regular rate and rhythm Lungs: clear to auscultation bilaterally Extremities: edema mild Wound: dressing dry  Lab Results: Recent Labs    02/17/24 2011 02/17/24 2013 02/18/24 0521  WBC 6.4  --  7.9  HGB 9.5* 9.2* 9.8*  HCT 28.0* 27.0* 29.0*  PLT 81*  --  83*   BMET:  Recent Labs    02/17/24 2011 02/17/24 2013 02/18/24 0521  NA 137 140 135  K 4.4 4.4 4.7  CL 107  --  106  CO2 21*  --  22  GLUCOSE 141*  --  110*  BUN 14  --  12  CREATININE 0.88  --  0.73  CALCIUM 8.1*  --  8.2*    PT/INR:  Recent Labs    02/17/24 1357  LABPROT 18.2*  INR 1.4*   ABG    Component Value Date/Time   PHART 7.346 (L) 02/17/2024 2013   HCO3 21.4 02/17/2024 2013   TCO2 23 02/17/2024 2013   ACIDBASEDEF 4.0 (H)  02/17/2024 2013   O2SAT 94 02/17/2024 2013   CBG (last 3)  Recent Labs    02/18/24 0114 02/18/24 0315 02/18/24 0518  GLUCAP 119* 111* 101*   CXR: clear  ECG: NSR, LAFB.  Assessment/Plan: S/P Procedure(s) (LRB): BENTALL PROCEDURE USING KONECT RESILIA AORTIC VALVED CONDUIT (N/A) REPLACEMENT, AORTA, ASCENDING USING HEMASHIED VASCULAR GRAFT (N/A) ECHOCARDIOGRAM, TRANSESOPHAGEAL, INTRAOPERATIVE (N/A)  POD 1 Hemodynamically stable in NSR. Will hold Lopressor  today and start tomorrow. Keep pacing wires today but roll up.  Start diuresis today.  Will keep chest tubes in today.  DC swan, arterial line.  IS, OOB, ambulate.  Glucose under good control on SSI. No DM and normal Hgb A1c preop. Will stop tomorrow.   LOS: 1 day    Dorise [name] 02/18/2024

## 2024-02-18 NOTE — Progress Notes (Signed)
 Patient ID: Chase Lowe, male   DOB: Jul 24, 1960, 63 y.o.   MRN: [id number]  TCTS Evening Rounds:  Hemodynamically stable in sinus rhythm.  Diuresing well  CT output low  Ambulated around the ICU.  PM labs pending. May need some K+.

## 2024-02-19 ENCOUNTER — Inpatient Hospital Stay (HOSPITAL_COMMUNITY)

## 2024-02-19 LAB — BASIC METABOLIC PANEL WITH GFR
Anion gap: 9 (ref 5–15)
BUN: 14 mg/dL (ref 8–23)
CO2: 26 mmol/L (ref 22–32)
Calcium: 8.8 mg/dL — ABNORMAL LOW (ref 8.9–10.3)
Chloride: 100 mmol/L (ref 98–111)
Creatinine, Ser: 0.94 mg/dL (ref 0.61–1.24)
GFR, Estimated: >60 mL/min (ref >60)
Glucose, Bld: 112 mg/dL — ABNORMAL HIGH (ref 70–99)
Potassium: 4.2 mmol/L (ref 3.5–5.1)
Sodium: 135 mmol/L (ref 135–145)

## 2024-02-19 LAB — CBC
HCT: 28.4 % — ABNORMAL LOW (ref 39.0–52.0)
Hemoglobin: 9.4 g/dL — ABNORMAL LOW (ref 13.0–17.0)
MCH: 30.3 pg (ref 26.0–34.0)
MCHC: 33.1 g/dL (ref 30.0–36.0)
MCV: 91.6 fL (ref 80.0–100.0)
Platelets: 72 K/uL — ABNORMAL LOW (ref 150–400)
RBC: 3.1 MIL/uL — ABNORMAL LOW (ref 4.22–5.81)
RDW: 13.3 % (ref 11.5–15.5)
WBC: 7.8 K/uL (ref 4.0–10.5)
nRBC: 0 % (ref 0.0–0.2)

## 2024-02-19 LAB — GLUCOSE, CAPILLARY: Glucose-Capillary: 103 mg/dL — ABNORMAL HIGH (ref 70–99)

## 2024-02-19 MED ORDER — METOPROLOL TARTRATE 12.5 MG HALF TABLET
12.5000 mg | ORAL_TABLET | Freq: Two times a day (BID) | ORAL | Status: DC
  Administered 2024-02-19: 12.5 mg via ORAL
  Filled 2024-02-19: qty 1

## 2024-02-19 NOTE — Progress Notes (Signed)
 Patient ID: Chase Lowe, male   DOB: 04/21/1961, 63 y.o.   MRN: [id number]  TCTS Evening Rounds:  Hemodynamically stable in sinus rhythm 99. Will start low dose Lopressor .  Good UO  CT's and pacing wires out.  Ambulated today without walker.

## 2024-02-19 NOTE — Progress Notes (Signed)
 2 Days Post-Op Procedure(s) (LRB): BENTALL PROCEDURE USING KONECT RESILIA AORTIC VALVED CONDUIT (N/A) REPLACEMENT, AORTA, ASCENDING USING HEMASHIED VASCULAR GRAFT (N/A) ECHOCARDIOGRAM, TRANSESOPHAGEAL, INTRAOPERATIVE (N/A) Subjective:  No complaints. Slept better. Wife stayed with him.  Objective: Vital signs in last 24 hours: Temp:  [97.2 F (36.2 C)-98.4 F (36.9 C)] 98.3 F (36.8 C) (08/17 0715) Pulse Rate:  [72-94] 94 (08/17 0700) Cardiac Rhythm: Normal sinus rhythm (08/16 2100) Resp:  [11-20] 17 (08/17 0700) BP: (95-119)/(60-72) 95/63 (08/17 0700) SpO2:  [91 %-100 %] 95 % (08/17 0700) Arterial Line BP: (87-138)/(55-68) 138/68 (08/16 1200) Weight:  [84.4 kg] 84.4 kg (08/17 0600)  Hemodynamic parameters for last 24 hours: PAP: (14-21)/(5-9) 14/5 CVP:  [3 mmHg-6 mmHg] 3 mmHg  Intake/Output from previous day: 08/16 0701 - 08/17 0700 In: 1122.4 [P.O.:840; I.V.:55.3; IV Piggyback:227.1] Out: 4280 [Urine:4010; Chest Tube:270] Intake/Output this shift: No intake/output data recorded.  General appearance: alert and cooperative Neurologic: intact Heart: regular rate and rhythm Lungs: clear to auscultation bilaterally Extremities: no edema Wound: incision is fine.  Lab Results: Recent Labs    02/18/24 1716 02/19/24 0332  WBC 8.0 7.8  HGB 9.5* 9.4*  HCT 28.6* 28.4*  PLT 82* 72*   BMET:  Recent Labs    02/18/24 1716 02/19/24 0332  NA 135 135  K 4.0 4.2  CL 101 100  CO2 25 26  GLUCOSE 159* 112*  BUN 14 14  CREATININE 1.09 0.94  CALCIUM 8.5* 8.8*    PT/INR:  Recent Labs    02/17/24 1357  LABPROT 18.2*  INR 1.4*   ABG    Component Value Date/Time   PHART 7.346 (L) 02/17/2024 2013   HCO3 21.4 02/17/2024 2013   TCO2 23 02/17/2024 2013   ACIDBASEDEF 4.0 (H) 02/17/2024 2013   O2SAT 94 02/17/2024 2013   CBG (last 3)  Recent Labs    02/18/24 1601 02/18/24 2014 02/19/24 0618  GLUCAP 121* 113* 103*   CXR: clear  Assessment/Plan: S/P  Procedure(s) (LRB): BENTALL PROCEDURE USING KONECT RESILIA AORTIC VALVED CONDUIT (N/A) REPLACEMENT, AORTA, ASCENDING USING HEMASHIED VASCULAR GRAFT (N/A) ECHOCARDIOGRAM, TRANSESOPHAGEAL, INTRAOPERATIVE (N/A)  POD 2 Hemodynamically stable in sinus rhythm. Will start low dose Lopressor  tomorrow. SBP 95 this am.   -6 L past two days. Wt is about at preop. Hold off on further diuresis.  DC pacing wires and if stable for two hours remove both chest tubes and sleeve, foley.  Transfer to 4E later today.  IS, mobilize.     LOS: 2 days    Chase Lowe 02/19/2024

## 2024-02-20 ENCOUNTER — Inpatient Hospital Stay (HOSPITAL_COMMUNITY)

## 2024-02-20 ENCOUNTER — Other Ambulatory Visit: Payer: Self-pay | Admitting: Cardiology

## 2024-02-20 ENCOUNTER — Encounter (HOSPITAL_COMMUNITY): Payer: Self-pay | Admitting: Surgery

## 2024-02-20 DIAGNOSIS — Z952 Presence of prosthetic heart valve: Secondary | ICD-10-CM

## 2024-02-20 LAB — BASIC METABOLIC PANEL WITH GFR
Anion gap: 10 (ref 5–15)
Anion gap: 9 (ref 5–15)
BUN: 14 mg/dL (ref 8–23)
BUN: 12 mg/dL (ref 8–23)
CO2: 26 mmol/L (ref 22–32)
CO2: 26 mmol/L (ref 22–32)
Calcium: 8.7 mg/dL — ABNORMAL LOW (ref 8.9–10.3)
Calcium: 8.8 mg/dL — ABNORMAL LOW (ref 8.9–10.3)
Chloride: 101 mmol/L (ref 98–111)
Chloride: 103 mmol/L (ref 98–111)
Creatinine, Ser: 0.92 mg/dL (ref 0.61–1.24)
Creatinine, Ser: 0.92 mg/dL (ref 0.61–1.24)
GFR, Estimated: >60 mL/min (ref >60)
GFR, Estimated: >60 mL/min (ref >60)
Glucose, Bld: 102 mg/dL — ABNORMAL HIGH (ref 70–99)
Glucose, Bld: 115 mg/dL — ABNORMAL HIGH (ref 70–99)
Potassium: 4.1 mmol/L (ref 3.5–5.1)
Potassium: 3.9 mmol/L (ref 3.5–5.1)
Sodium: 137 mmol/L (ref 135–145)
Sodium: 138 mmol/L (ref 135–145)

## 2024-02-20 LAB — CBC
HCT: 28.6 % — ABNORMAL LOW (ref 39.0–52.0)
Hemoglobin: 9.5 g/dL — ABNORMAL LOW (ref 13.0–17.0)
MCH: 30.4 pg (ref 26.0–34.0)
MCHC: 33.2 g/dL (ref 30.0–36.0)
MCV: 91.4 fL (ref 80.0–100.0)
Platelets: 89 K/uL — ABNORMAL LOW (ref 150–400)
RBC: 3.13 MIL/uL — ABNORMAL LOW (ref 4.22–5.81)
RDW: 13.2 % (ref 11.5–15.5)
WBC: 7.8 K/uL (ref 4.0–10.5)
nRBC: 0 % (ref 0.0–0.2)

## 2024-02-20 MED ORDER — ASPIRIN 81 MG PO TBEC
81.0000 mg | DELAYED_RELEASE_TABLET | Freq: Every day | ORAL | Status: DC
  Administered 2024-02-20 – 2024-02-21 (×2): 81 mg via ORAL
  Filled 2024-02-20 (×2): qty 1

## 2024-02-20 MED ORDER — METOPROLOL TARTRATE 25 MG PO TABS
25.0000 mg | ORAL_TABLET | Freq: Two times a day (BID) | ORAL | Status: DC
  Administered 2024-02-20 – 2024-02-21 (×3): 25 mg via ORAL
  Filled 2024-02-20 (×3): qty 1

## 2024-02-20 MED ORDER — LACTULOSE 10 GM/15ML PO SOLN: 20.0000 g | Freq: Every day | ORAL | Status: DC | PRN

## 2024-02-20 MED ORDER — SODIUM CHLORIDE 0.9% FLUSH
3.0000 mL | Freq: Two times a day (BID) | INTRAVENOUS | Status: DC
  Administered 2024-02-20 – 2024-02-21 (×2): 3 mL via INTRAVENOUS

## 2024-02-20 MED ORDER — SODIUM CHLORIDE 0.9% FLUSH: 3.0000 mL | INTRAVENOUS | Status: DC | PRN

## 2024-02-20 MED ORDER — SODIUM CHLORIDE 0.9 % IV SOLN: 250.0000 mL | INTRAVENOUS | Status: DC | PRN

## 2024-02-20 MED ORDER — METOPROLOL TARTRATE 12.5 MG HALF TABLET: 12.5000 mg | ORAL_TABLET | Freq: Two times a day (BID) | ORAL | Status: DC

## 2024-02-20 MED ORDER — FENTANYL CITRATE PF 50 MCG/ML IJ SOSY: 25.0000 ug | PREFILLED_SYRINGE | Freq: Once | INTRAMUSCULAR | Status: DC

## 2024-02-20 MED ORDER — ~~LOC~~ CARDIAC SURGERY, PATIENT & FAMILY EDUCATION: Freq: Once | Status: AC

## 2024-02-20 NOTE — Progress Notes (Signed)
 EVENING ROUNDS NOTE :     [address]       [place] [address]             [phone]                 3 Days Post-Op Procedure(s) (LRB): BENTALL PROCEDURE USING KONECT RESILIA AORTIC VALVED CONDUIT (N/A) REPLACEMENT, AORTA, ASCENDING USING HEMASHIED VASCULAR GRAFT (N/A) ECHOCARDIOGRAM, TRANSESOPHAGEAL, INTRAOPERATIVE (N/A)   Total Length of Stay:  LOS: 3 days  Events:   No events Awaiting floor bed    BP 100/71   Pulse 92   Temp 98.6 F (37 C) (Oral)   Resp 16   Ht (P) 5' 11 (1.803 m)   Wt 83.5 kg   SpO2 96%   BMI (P) 25.66 kg/m          sodium chloride       I/O last 3 completed shifts: In: 1172.6 [P.O.:920; IV Piggyback:252.6] Out: 2775 [Urine:2635; Chest Tube:140]      Latest Ref Rng & Units 02/19/2024    3:32 AM 02/18/2024    5:16 PM 02/18/2024    5:21 AM  CBC  WBC 4.0 - 10.5 K/uL 7.8  8.0  7.9   Hemoglobin 13.0 - 17.0 g/dL 9.4  9.5  9.8   Hematocrit 39.0 - 52.0 % 28.4  28.6  29.0   Platelets 150 - 400 K/uL 72  82  83        Latest Ref Rng & Units 02/20/2024    2:53 AM 02/19/2024    3:32 AM 02/18/2024    5:16 PM  BMP  Glucose 70 - 99 mg/dL 897  887  840   BUN 8 - 23 mg/dL 14  14  14    Creatinine 0.61 - 1.24 mg/dL 9.07  9.05  8.90   Sodium 135 - 145 mmol/L 137  135  135   Potassium 3.5 - 5.1 mmol/L 4.1  4.2  4.0   Chloride 98 - 111 mmol/L 101  100  101   CO2 22 - 32 mmol/L 26  26  25    Calcium 8.9 - 10.3 mg/dL 8.7  8.8  8.5     ABG    Component Value Date/Time   PHART 7.346 (L) 02/17/2024 2013   PCO2ART 38.9 02/17/2024 2013   PO2ART 71 (L) 02/17/2024 2013   HCO3 21.4 02/17/2024 2013   TCO2 23 02/17/2024 2013   ACIDBASEDEF 4.0 (H) 02/17/2024 2013   O2SAT 94 02/17/2024 2013       Linnie Rayas, MD 02/20/2024 4:02 PM

## 2024-02-20 NOTE — Progress Notes (Signed)
 Pt received OHS book and education on restrictions, heart healthy diet, ex guidelines, Move in the Tube sheet, incentive spirometer use when d/c and CRPII. Pt denies questions and was encouraged to look in the book for additional information. Referral placed to [facility].   Pt is interested in CR at [facility].  Garen FORBES Candy MS, ACSM-CEP  02/20/2024 2:19 PM

## 2024-02-20 NOTE — Progress Notes (Signed)
 Post op 6 week echo ordered

## 2024-02-20 NOTE — Progress Notes (Signed)
 3 Days Post-Op Procedure(s) (LRB): BENTALL PROCEDURE USING KONECT RESILIA AORTIC VALVED CONDUIT (N/A) REPLACEMENT, AORTA, ASCENDING USING HEMASHIED VASCULAR GRAFT (N/A) ECHOCARDIOGRAM, TRANSESOPHAGEAL, INTRAOPERATIVE (N/A) Subjective: No complaints.   Ambulating, passing gas but no BM yet. Sitting up eating breakfast.  Objective: Vital signs in last 24 hours: Temp:  [97.9 F (36.6 C)-98.4 F (36.9 C)] 98.4 F (36.9 C) (08/17 1945) Pulse Rate:  [79-106] 81 (08/18 0600) Cardiac Rhythm: Normal sinus rhythm (08/17 2100) Resp:  [14-22] 15 (08/18 0600) BP: (95-130)/(63-79) 116/71 (08/18 0600) SpO2:  [92 %-99 %] 95 % (08/18 0600) Weight:  [83.5 kg] 83.5 kg (08/18 0600)  Hemodynamic parameters for last 24 hours:    Intake/Output from previous day: 08/17 0701 - 08/18 0700 In: 925.5 [P.O.:800; IV Piggyback:125.5] Out: 2165 [Urine:2125; Chest Tube:40] Intake/Output this shift: Total I/O In: -  Out: 350 [Urine:350]  General appearance: alert and cooperative Neurologic: intact Heart: regular rate and rhythm Lungs: clear to auscultation bilaterally Extremities: no edema Wound: dressing dry  Lab Results: Recent Labs    02/18/24 1716 02/19/24 0332  WBC 8.0 7.8  HGB 9.5* 9.4*  HCT 28.6* 28.4*  PLT 82* 72*   BMET:  Recent Labs    02/19/24 0332 02/20/24 0253  NA 135 137  K 4.2 4.1  CL 100 101  CO2 26 26  GLUCOSE 112* 102*  BUN 14 14  CREATININE 0.94 0.92  CALCIUM 8.8* 8.7*    PT/INR:  Recent Labs    02/17/24 1357  LABPROT 18.2*  INR 1.4*   ABG    Component Value Date/Time   PHART 7.346 (L) 02/17/2024 2013   HCO3 21.4 02/17/2024 2013   TCO2 23 02/17/2024 2013   ACIDBASEDEF 4.0 (H) 02/17/2024 2013   O2SAT 94 02/17/2024 2013   CBG (last 3)  Recent Labs    02/18/24 1601 02/18/24 2014 02/19/24 0618  GLUCAP 121* 113* 103*   CXR: pending today  Assessment/Plan: S/P Procedure(s) (LRB): BENTALL PROCEDURE USING KONECT RESILIA AORTIC  VALVED CONDUIT (N/A) REPLACEMENT, AORTA, ASCENDING USING HEMASHIED VASCULAR GRAFT (N/A) ECHOCARDIOGRAM, TRANSESOPHAGEAL, INTRAOPERATIVE (N/A)  POD 3 Hemodynamically stable in sinus rhythm 98. Will increase Lopressor  to 25 bid. He was on lisinopril  at home but will probably hold off on that and resume as outpt as BP rises. Beta blocker is a better option for him at this time to help prevent atrial fib.  Wt is about at preop according to him which is 185. I don't think he needs further diuresis.   Transfer to 4E and continue mobilization, IS.   Plan home tomorrow with his wife if no changes.     LOS: 3 days    Chase Lowe 02/20/2024

## 2024-02-20 NOTE — Progress Notes (Signed)
 Patient brought to 4E from 2H. VSS. Telemetry box applied, CCMD notified. Patient oriented to room and staff. Call bell in reach. Wife present.  Jackquelyn JONETTA Casino, RN    02/20/24 1615  Vitals  Temp 98.6 F (37 C)  Temp Source Oral  BP 130/74  MAP (mmHg) 83  BP Location Left Arm  BP Method Automatic  Patient Position (if appropriate) Lying  Pulse Rate 95  Pulse Rate Source Monitor  ECG Heart Rate 96  Resp 20  Level of Consciousness  Level of Consciousness Alert  MEWS COLOR  MEWS Score Color Green  Oxygen Therapy  SpO2 98 %  O2 Device Room Air  MEWS Score  MEWS Temp 0  MEWS Systolic 0  MEWS Pulse 0  MEWS RR 0  MEWS LOC 0  MEWS Score 0

## 2024-02-21 ENCOUNTER — Other Ambulatory Visit (HOSPITAL_COMMUNITY): Payer: Self-pay

## 2024-02-21 LAB — BASIC METABOLIC PANEL WITH GFR
Anion gap: 4 — ABNORMAL LOW (ref 5–15)
BUN: 11 mg/dL (ref 8–23)
CO2: 26 mmol/L (ref 22–32)
Calcium: 8.9 mg/dL (ref 8.9–10.3)
Chloride: 107 mmol/L (ref 98–111)
Creatinine, Ser: 0.97 mg/dL (ref 0.61–1.24)
GFR, Estimated: >60 mL/min (ref >60)
Glucose, Bld: 104 mg/dL — ABNORMAL HIGH (ref 70–99)
Potassium: 4 mmol/L (ref 3.5–5.1)
Sodium: 137 mmol/L (ref 135–145)

## 2024-02-21 LAB — SURGICAL PATHOLOGY

## 2024-02-21 MED ORDER — ACETAMINOPHEN 500 MG PO TABS: 500.0000 mg | ORAL_TABLET | Freq: Four times a day (QID) | ORAL | Status: AC | PRN

## 2024-02-21 MED ORDER — METOPROLOL TARTRATE 25 MG PO TABS
25.0000 mg | ORAL_TABLET | Freq: Two times a day (BID) | ORAL | 3 refills | Status: DC
  Filled 2024-02-21: qty 60, 30d supply, fill #0

## 2024-02-21 MED ORDER — VALACYCLOVIR HCL 1 G PO TABS
1000.0000 mg | ORAL_TABLET | Freq: Two times a day (BID) | ORAL | 0 refills | Status: DC
  Filled 2024-02-21: qty 1, 1d supply, fill #0

## 2024-02-21 MED ORDER — TRAMADOL HCL 50 MG PO TABS
50.0000 mg | ORAL_TABLET | ORAL | 0 refills | Status: DC | PRN
  Filled 2024-02-21: qty 30, 5d supply, fill #0

## 2024-02-21 NOTE — TOC Initial Note (Signed)
 Transition of Care [facility]) - Initial/Assessment Note    Patient Details  Name: Chase Lowe MRN: [id number] Date of Birth: December 25, 1960  Transition of Care [facility]) CM/SW Contact:    Justina Delcia Czar, RN Phone Number: [phone] 02/21/2024, 9:20 AM  Clinical Narrative:                 Spoke to pt and wife at bedside. Gave permission to speak to wife. Wife states pt was independent at home. Does not anticipate any DME/HH will be needed. Wife will provide transportation to home.  Wife states FMLA/STD has been addressed.   Will continue to follow for dc needs.   Expected Discharge Plan: Home/Self Care Barriers to Discharge: Continued Medical Work up   Patient Goals and CMS Choice Patient states their goals for this hospitalization and ongoing recovery are:: wants to remain independent          Expected Discharge Plan and Services   Discharge Planning Services: CM Consult   Living arrangements for the past 2 months: Single Family Home Expected Discharge Date: 02/21/24                                    Prior Living Arrangements/Services Living arrangements for the past 2 months: Single Family Home Lives with:: Spouse Patient language and need for interpreter reviewed:: Yes Do you feel safe going back to the place where you live?: Yes      Need for Family Participation in Patient Care: No (Comment) Care giver support system in place?: Yes (comment)   Criminal Activity/Legal Involvement Pertinent to Current Situation/Hospitalization: No - Comment as needed  Activities of Daily Living   ADL Screening (condition at time of admission) Independently performs ADLs?: Yes (appropriate for developmental age) Is the patient deaf or have difficulty hearing?: No Does the patient have difficulty seeing, even when wearing glasses/contacts?: No Does the patient have difficulty concentrating, remembering, or making decisions?: No  Permission Sought/Granted Permission  sought to share information with : Case Manager, Family Supports, PCP Permission granted to share information with : Yes, Verbal Permission Granted  Share Information with NAME: Christopher Glasscock  Permission granted to share info w AGENCY: PCP, DME  Permission granted to share info w Relationship: wife  Permission granted to share info w Contact Information: [phone]  Emotional Assessment Appearance:: Appears stated age Attitude/Demeanor/Rapport: Engaged Affect (typically observed): Accepting Orientation: : Oriented to Self, Oriented to Place, Oriented to  Time, Oriented to Situation   Psych Involvement: No (comment)  Admission diagnosis:  Aneurysm of ascending aorta without rupture (HCC) [I71.21] Aortic valve stenosis, etiology of cardiac valve disease unspecified [I35.0] Aortic stenosis due to bicuspid aortic valve [I35.0, Q23.81] Patient Active Problem List   Diagnosis Date Noted   S/P AVR (aortic valve replacement) 02/18/2024   S/P ascending aortic replacement 02/18/2024   Aortic stenosis due to bicuspid aortic valve 02/17/2024   Thoracic aortic aneurysm (TAA) (HCC) 12/01/2023   S/P revision of total knee, right 11/18/2022   S/P cervical spinal fusion 06/30/2017   PCP:  Candise Aleene DEL, MD Pharmacy:   [facility] - [facility] [address] [place] [address] Phone: [phone] Fax: [phone]  [facility] [address], [place] - [address] [place] [address] Phone: [phone] Fax: [phone]     Social Drivers of Health (SDOH) Social History: SDOH Screenings  Food Insecurity: No Food Insecurity (02/19/2024)  Housing: Low Risk  (02/19/2024)  Transportation Needs: No Transportation Needs (02/19/2024)  Utilities: Not At Risk (02/19/2024)  Alcohol Screen: Low Risk  (07/11/2023)  Depression (PHQ2-9): Low Risk  (01/13/2024)  Financial Resource Strain: Low Risk  (01/09/2024)  Physical Activity: Insufficiently Active  (01/09/2024)  Social Connections: Moderately Integrated (01/09/2024)  Stress: No Stress Concern Present (01/09/2024)  Tobacco Use: Medium Risk (02/17/2024)   SDOH Interventions:     Readmission Risk Interventions    11/19/2022   10:52 AM  Readmission Risk Prevention Plan  Post Dischage Appt Complete  Medication Screening Complete  Transportation Screening Complete

## 2024-02-21 NOTE — Progress Notes (Signed)
 4 Days Post-Op Procedure(s) (LRB): BENTALL PROCEDURE USING KONECT RESILIA AORTIC VALVED CONDUIT (N/A) REPLACEMENT, AORTA, ASCENDING USING HEMASHIED VASCULAR GRAFT (N/A) ECHOCARDIOGRAM, TRANSESOPHAGEAL, INTRAOPERATIVE (N/A) Subjective:  No complaints. Had a stable night. Eating breakfast with his wife. Ready to go home.  Mild sinus tachy low 100's on monitor.  Objective: Vital signs in last 24 hours: Temp:  [98.4 F (36.9 C)-99 F (37.2 C)] 98.4 F (36.9 C) (08/19 0346) Pulse Rate:  [79-109] 89 (08/19 0346) Cardiac Rhythm: Sinus tachycardia (08/18 2053) Resp:  [15-21] 15 (08/19 0346) BP: (91-130)/(67-74) 106/73 (08/19 0346) SpO2:  [93 %-98 %] 97 % (08/19 0346) Weight:  [82.6 kg] 82.6 kg (08/19 9367)  Hemodynamic parameters for last 24 hours:    Intake/Output from previous day: 08/18 0701 - 08/19 0700 In: 960 [P.O.:960] Out: -  Intake/Output this shift: No intake/output data recorded.  General appearance: alert and cooperative Neurologic: intact Heart: regular rate and rhythm Lungs: clear to auscultation bilaterally Extremities: no edema Wound: incision healing well  Lab Results: Recent Labs    02/19/24 0332 02/20/24 1839  WBC 7.8 7.8  HGB 9.4* 9.5*  HCT 28.4* 28.6*  PLT 72* 89*   BMET:  Recent Labs    02/20/24 1839 02/21/24 0452  NA 138 137  K 3.9 4.0  CL 103 107  CO2 26 26  GLUCOSE 115* 104*  BUN 12 11  CREATININE 0.92 0.97  CALCIUM 8.8* 8.9    PT/INR: No results for input(s): LABPROT, INR in the last 72 hours. ABG    Component Value Date/Time   PHART 7.346 (L) 02/17/2024 2013   HCO3 21.4 02/17/2024 2013   TCO2 23 02/17/2024 2013   ACIDBASEDEF 4.0 (H) 02/17/2024 2013   O2SAT 94 02/17/2024 2013   CBG (last 3)  Recent Labs    02/18/24 1601 02/18/24 2014 02/19/24 0618  GLUCAP 121* 113* 103*    Assessment/Plan: S/P Procedure(s) (LRB): BENTALL PROCEDURE USING KONECT RESILIA AORTIC VALVED CONDUIT (N/A) REPLACEMENT,  AORTA, ASCENDING USING HEMASHIED VASCULAR GRAFT (N/A) ECHOCARDIOGRAM, TRANSESOPHAGEAL, INTRAOPERATIVE (N/A)  POD 4  Hemodynamically stable. Continue Lopressor  25 bid. BP is not high enough to titrate up further.   ASA 81 mg. Hold off on lisinopril .  Plan home today.   LOS: 4 days    Chase Lowe Chase Lowe 02/21/2024

## 2024-02-21 NOTE — Progress Notes (Signed)
 CARDIAC REHAB PHASE I    Postop education completed. CRP2 referral sent to [facility]. Plan for discharge home today.   Vaughn Asberry Hacking, RN BSN 02/21/2024 9:37 AM

## 2024-02-23 ENCOUNTER — Telehealth: Payer: Self-pay | Admitting: Gastroenterology

## 2024-02-23 ENCOUNTER — Telehealth: Payer: Self-pay

## 2024-02-23 MED FILL — Electrolyte-R (PH 7.4) Solution: INTRAVENOUS | Qty: 4000 | Status: AC

## 2024-02-23 MED FILL — Sodium Bicarbonate IV Soln 8.4%: INTRAVENOUS | Qty: 50 | Status: AC

## 2024-02-23 MED FILL — Mannitol IV Soln 20%: INTRAVENOUS | Qty: 500 | Status: AC

## 2024-02-23 MED FILL — Heparin Sodium (Porcine) Inj 1000 Unit/ML: INTRAMUSCULAR | Qty: 30 | Status: AC

## 2024-02-23 MED FILL — Sodium Chloride IV Soln 0.9%: INTRAVENOUS | Qty: 2000 | Status: AC

## 2024-02-23 MED FILL — Magnesium Sulfate Inj 50%: INTRAMUSCULAR | Qty: 2 | Status: AC

## 2024-02-23 MED FILL — Heparin Sodium (Porcine) Inj 1000 Unit/ML: INTRAMUSCULAR | Qty: 10 | Status: AC

## 2024-02-23 MED FILL — Heparin Sodium (Porcine) Inj 1000 Unit/ML: Qty: 1000 | Status: AC

## 2024-02-23 MED FILL — Potassium Chloride Inj 2 mEq/ML: INTRAVENOUS | Qty: 40 | Status: AC

## 2024-02-23 NOTE — Telephone Encounter (Signed)
 Patient contacted the office requesting if he was able to drink Owyn protein shakes. He is s/p Bentall with Dr. Lucas 8/15. Advised he is ok to drink protein shakes as long as it does not have a lot of added caffeine. This shake does not he states. He acknowledged receipt. States that he has been doing really well. States that his heart rate has maintained in the 80-90 range, but he feels that it is fast. States he is taking his medication as prescribed including metoprolol .

## 2024-02-23 NOTE — Telephone Encounter (Signed)
 Colonoscopy and pathology results in this chart showed that he had 3 adenomatous polyps on a colonoscopy with Dr. Celestia in [facility] in February 2016.  Primary care note from July indicates patient requested referral to a different GI practice.  However, this patient was just discharged from the hospital 2 days ago after a complex cardiovascular surgery for graft repair of his thoracic aortic aneurysm and aortic valve replacement.  Due to that, he is not appropriate for routine surveillance colonoscopy for at least 6 months.  I recommend he contact us  around that time for consideration of colonoscopy.  VEAR Brand MD

## 2024-02-23 NOTE — Telephone Encounter (Signed)
 Good Afternoon Dr.Danis,  DOD PM  02/23/2024  Patient called wanting to be scheduled for a colonoscopy but when asked he did not know when he last had one done nor did he remember where he last had it done. After viewing chart seen he had a referral sent to us  and it stated he was last seen at [facility] but when asked patient he stated he does not recall seeing [facility]   Please view and advise for future scheduling  Thank you

## 2024-02-23 NOTE — Telephone Encounter (Signed)
-----   Message from Nurse Bernardino B sent at 02/23/2024  3:07 PM EDT ----- Hey girls,  Can someone call this pt back for me when you have a minute?  He left me a VM saying about drinking a certain protein shake??  Call back is: [phone]   Thanks, Bernardino

## 2024-02-27 ENCOUNTER — Ambulatory Visit: Payer: Self-pay | Attending: Surgery

## 2024-02-27 DIAGNOSIS — Z4802 Encounter for removal of sutures: Secondary | ICD-10-CM

## 2024-03-02 ENCOUNTER — Other Ambulatory Visit: Payer: Self-pay | Admitting: Physician Assistant

## 2024-03-02 ENCOUNTER — Encounter: Payer: Self-pay | Admitting: Surgery

## 2024-03-02 DIAGNOSIS — R051 Acute cough: Secondary | ICD-10-CM

## 2024-03-02 MED ORDER — BENZONATATE 100 MG PO CAPS: 200.0000 mg | ORAL_CAPSULE | Freq: Four times a day (QID) | ORAL | 0 refills | Status: DC | PRN

## 2024-03-02 NOTE — Progress Notes (Signed)
 [name] contacted the office with complaint of dry cough.  He states that it comes and goes and has been difficult in setting of recent sternotomy.  He denies shortness of breath.  He states he has been using his IS since discharge.  His weight has also been stable.  He has not been laying flat since surgery due to pain in his chest.   Plan:  Will order Tessalon  Perles to help with cough.. the patient did not wish to take Codeine. 2. CXR- patient will obtain on Tuesday 9/2 when he comes down for his Cardiology follow up.   Of note patient is concerned Lopressor  is causing his cough.  However in setting of recent Bentall procedure I am unwilling to stop medication at this time.  At time of cardiology visit this can be transitioned to different agent if there remains concern this is source of his cough.  However good blood pressure control is imperative.  Rocky Shad, PA-C 11:06 AM 03/02/24

## 2024-03-06 ENCOUNTER — Encounter: Payer: Self-pay | Admitting: Emergency Medicine

## 2024-03-06 ENCOUNTER — Ambulatory Visit: Attending: Emergency Medicine | Admitting: Emergency Medicine

## 2024-03-06 VITALS — BP 104/60 | HR 71 | Ht 71.0 in | Wt 185.0 lb

## 2024-03-06 DIAGNOSIS — E785 Hyperlipidemia, unspecified: Secondary | ICD-10-CM | POA: Diagnosis not present

## 2024-03-06 DIAGNOSIS — Z952 Presence of prosthetic heart valve: Secondary | ICD-10-CM

## 2024-03-06 DIAGNOSIS — I2510 Atherosclerotic heart disease of native coronary artery without angina pectoris: Secondary | ICD-10-CM

## 2024-03-06 DIAGNOSIS — I10 Essential (primary) hypertension: Secondary | ICD-10-CM | POA: Diagnosis not present

## 2024-03-06 DIAGNOSIS — Z95828 Presence of other vascular implants and grafts: Secondary | ICD-10-CM

## 2024-03-06 NOTE — Patient Instructions (Signed)
 Medication Instructions:  NO CHANGES  Lab Work: BMET TO BE DONE TODAY.  Testing/Procedures: NONE  Follow-Up: At [facility], you and your health needs are our priority.  As part of our continuing mission to provide you with exceptional heart care, our providers are all part of one team.  This team includes your primary Cardiologist (physician) and Advanced Practice Providers or APPs (Physician Assistants and Nurse Practitioners) who all work together to provide you with the care you need, when you need it.  Your next appointment:   3 MONTHS  Provider:   MADISON FOUNTAIN, NP

## 2024-03-06 NOTE — Progress Notes (Signed)
 Cardiology Office Note:    Date:  03/06/2024  ID:  Chase Lowe, DOB December 24, 1960, MRN [id number] PCP: Candise Aleene DEL, MD  [facility] Providers Cardiologist:  Lurena MARLA Red, MD Cardiology APP:  Rana Lum CROME, NP       Patient Profile:       Chief Complaint: Follow-up ascending aortic aneurysm repair and AVR History of Present Illness:  Chase Lowe is a 63 y.o. male with visit-pertinent history of bicuspid aortic valve with aortic stenosis s/p AVR, ascending aortic aneurysm s/p repair with Bentall procedure, hypertension, hyperlipidemia, CKD stage II  Echocardiogram 11/04/2023 showed bicuspid aortic valve with moderate aortic valve stenosis with mean gradient 25 mmHg, aneurysm of ascending aorta measuring 47 mm, LVEF 55 to 60%, no RWMA, mild LVH, mild mitral valve regurgitation.  Patient established with cardiology service on 12/26/2023 for preoperative cardiovascular examination prior to AVR and Bentall procedure by Dr. Lucas.  Cardiac catheterization 12/2023 showed proximal circumflex and mid circumflex lesion 20% stenosed, mid LAD lesion is 5% stenosed.  Carotid duplex 02/2024 showed bilateral ICA were near normal with only minimal wall thickening or plaque.  On 02/17/2024 he underwent replacement of the ascending aorta and aortic valve with Bentall procedure.  Procedure was uncomplicated and patient maintained NSR and did not require support with pressors/inotropes.   Discussed the use of AI scribe software for clinical note transcription with the patient, who gave verbal consent to proceed.  History of Present Illness Chase Lowe is a 63 year old male who presents for follow-up post AVR and ascending aortic aneurysm repair.  Today he tells me he is doing well overall.  He is without any acute cardiovascular concerns or complaints.  Prior to surgery, he was asymptomatic except for occasional fatigue. Post-operatively, he experiences fatigue after exertion but  no shortness of breath, chest pain, orthopnea, leg swelling, melena, hematochezia, syncope, palpitations, or issues with the surgical site. He remains active, walking long distances without difficulty.  He is currently on aspirin  and metoprolol . He discontinued tramadol  due to adverse effects and uses Tylenol  for minimal post-surgical pain.  He manages his cholesterol through diet, having lost over 30 pounds since January 2025, reducing his LDL cholesterol from 120 to 83.  He has advised his sons to get checked for similar heart issues.  Review of systems:  Please see the history of present illness. All other systems are reviewed and otherwise negative.      Studies Reviewed:        Echocardiogram 11/04/2023  1. The aortic valve is abnormal with indeterminant number of cusps.  Unable to determine aortic valve morphology due to image quality. There is  moderate calcification of the aortic valve. Aortic valve regurgitation is  not visualized. Moderate aortic  valve stenosis. Aortic valve area, by VTI measures 0.71 cm. Aortic valve  mean gradient measures 25.0 mmHg. Aortic valve Vmax measures 3.37 m/s.   2. Aortic dilatation noted. Aneurysm of the ascending aorta, measuring 47  mm. There is moderate dilatation of the ascending aorta.   3. Left ventricular ejection fraction, by estimation, is 55 to 60%. Left  ventricular ejection fraction by 3D volume is 57 %. The left ventricle has  normal function. The left ventricle has no regional wall motion  abnormalities. There is mild left  ventricular hypertrophy. Left ventricular diastolic parameters were  normal. The average left ventricular global longitudinal strain is -18.3  %. The global longitudinal strain is normal.   4. Normal right  ventricular free wall strain, -28.1%. . Right ventricular  systolic function is normal. The right ventricular size is mildly  enlarged. Tricuspid regurgitation signal is inadequate for assessing PA   pressure.   5. The mitral valve is grossly normal. Mild mitral valve regurgitation.  No evidence of mitral stenosis.   6. The inferior vena cava is normal in size with greater than 50%  respiratory variability, suggesting right atrial pressure of 3 mmHg.   Cardiac catheterization 12/28/2023   Prox Cx to Mid Cx lesion is 20% stenosed.   Mid LAD lesion is 5% stenosed.   1.  Mild, nonobstructive coronary artery disease. 2.  Fick cardiac output of 7.5 L/min and Fick cardiac index of 3.7 L/min/m with the following hemodynamics:            Right atrial pressure mean of 2 mmHg            Right ventricular pressure 26/-2 with an end-diastolic pressure of 5 mmHg            Wedge pressure mean of 6 mmHg            PA pressure 20/8 with a mean of 14 mmHg            PVR 1.1 Woods units            PA pulsatility index of 6   Recommendation: Continued evaluation for aortic valve replacement and ascending aortic aneurysm repair. Diagnostic Dominance: Right  Risk Assessment/Calculations:              Physical Exam:   VS:  BP 104/60 (BP Location: Left Arm, Patient Position: Sitting, Cuff Size: Normal)   Pulse 71   Ht 5' 11 (1.803 m)   Wt 185 lb (83.9 kg)   BMI 25.80 kg/m    Wt Readings from Last 3 Encounters:  03/06/24 185 lb (83.9 kg)  02/21/24 182 lb 1.6 oz (82.6 kg)  02/14/24 180 lb (81.6 kg)    GEN: Well nourished, well developed in no acute distress NECK: No JVD; No carotid bruits CARDIAC: RRR, no murmurs, rubs, gallops RESPIRATORY:  Clear to auscultation without rales, wheezing or rhonchi  ABDOMEN: Soft, non-tender, non-distended EXTREMITIES:  No edema; No acute deformity      Assessment and Plan:  Bicuspid aortic stenosis Ascending aortic aneurysm S/p AVR and Bentall procedure on 8/15 with uncomplicated hospital course - Today patient is without chest pains, dyspnea, syncope.  Has had minimal postoperative pain - Sternal incision is healing appropriately without signs of  infection - Scheduled for postop echocardiogram on 9/26 - Has follow-up scheduled with Dr. Lucas on 9/17 - Continue aspirin  81 mg daily and metoprolol  tartrate 25 mg twice daily - BMET today  Coronary artery disease Cardiac catheterization 12/2023 showed proximal LCx to mid LCx lesion with 20% stenosis and mid LAD lesion with 5% stenosis - Today patient is without anginal symptoms.  Has resumed minimal activity without exertional chest pains.  No indication for further ischemic evaluation at this time - Continue aspirin  81 mg daily - Prefers not to start cholesterol lowering therapy at this time.  Prefers to manage with diet and exercise alone  Hyperlipidemia, LDL goal <70 LDL 83 on 09/2023 and not well-controlled Was previously 141 and he was able to lower to 83 with diet and exercise alone - Much education given on statin lowering therapy today.  Patient politely defers and will continue with diet and exercise - Reports he will have repeat lipid  panel with his PCP  Hypertension Blood pressure today is 104/60 and well-controlled - Continue metoprolol  tartrate 25 mg twice daily     Cardiac Rehabilitation Eligibility Assessment  The patient is ready to start cardiac rehabilitation pending clearance from the cardiac surgeon.     Dispo:  Return in about 3 months (around 06/05/2024).  Signed, Lum LITTIE Louis, NP

## 2024-03-07 ENCOUNTER — Ambulatory Visit: Payer: Self-pay | Admitting: Emergency Medicine

## 2024-03-07 LAB — BASIC METABOLIC PANEL WITH GFR
BUN/Creatinine Ratio: 10 (ref 10–24)
BUN: 10 mg/dL (ref 8–27)
CO2: 19 mmol/L — ABNORMAL LOW (ref 20–29)
Calcium: 9.6 mg/dL (ref 8.6–10.2)
Chloride: 102 mmol/L (ref 96–106)
Creatinine, Ser: 1.01 mg/dL (ref 0.76–1.27)
Glucose: 89 mg/dL (ref 70–99)
Potassium: 4.8 mmol/L (ref 3.5–5.2)
Sodium: 139 mmol/L (ref 134–144)
eGFR: 83 mL/min/1.73 (ref >59)

## 2024-03-17 ENCOUNTER — Other Ambulatory Visit: Payer: Self-pay | Admitting: Family Medicine

## 2024-03-19 ENCOUNTER — Other Ambulatory Visit (HOSPITAL_COMMUNITY): Payer: Self-pay

## 2024-03-19 ENCOUNTER — Other Ambulatory Visit: Payer: Self-pay

## 2024-03-19 MED ORDER — ASPIRIN 81 MG PO TBEC
81.0000 mg | DELAYED_RELEASE_TABLET | Freq: Every day | ORAL | 3 refills | Status: AC
  Filled 2024-03-19: qty 90, 90d supply, fill #0
  Filled 2024-05-16 – 2024-06-11 (×2): qty 30, 30d supply, fill #1

## 2024-03-20 ENCOUNTER — Other Ambulatory Visit: Payer: Self-pay | Admitting: Surgery

## 2024-03-20 DIAGNOSIS — I7121 Aneurysm of the ascending aorta, without rupture: Secondary | ICD-10-CM

## 2024-03-21 ENCOUNTER — Other Ambulatory Visit (HOSPITAL_COMMUNITY): Payer: Self-pay

## 2024-03-21 ENCOUNTER — Other Ambulatory Visit: Payer: Self-pay

## 2024-03-21 ENCOUNTER — Encounter: Payer: Self-pay | Admitting: Surgery

## 2024-03-21 ENCOUNTER — Ambulatory Visit (HOSPITAL_COMMUNITY)
Admission: RE | Admit: 2024-03-21 | Discharge: 2024-03-21 | Disposition: A | Discharge status: Home / Self Care | Source: Ambulatory Visit | Attending: Cardiovascular Disease | Admitting: Cardiovascular Disease

## 2024-03-21 ENCOUNTER — Ambulatory Visit: Payer: Self-pay | Attending: Surgery | Admitting: Surgery

## 2024-03-21 VITALS — BP 121/77 | HR 80 | Resp 18 | Ht 71.0 in | Wt 188.0 lb

## 2024-03-21 DIAGNOSIS — I712 Thoracic aortic aneurysm, without rupture, unspecified: Secondary | ICD-10-CM | POA: Diagnosis not present

## 2024-03-21 DIAGNOSIS — I7121 Aneurysm of the ascending aorta, without rupture: Secondary | ICD-10-CM | POA: Insufficient documentation

## 2024-03-21 DIAGNOSIS — Z952 Presence of prosthetic heart valve: Secondary | ICD-10-CM

## 2024-03-21 DIAGNOSIS — Z95828 Presence of other vascular implants and grafts: Secondary | ICD-10-CM

## 2024-03-21 MED ORDER — METOPROLOL TARTRATE 25 MG PO TABS
25.0000 mg | ORAL_TABLET | Freq: Two times a day (BID) | ORAL | 3 refills | Status: DC
  Filled 2024-03-21: qty 60, 30d supply, fill #0
  Filled 2024-04-16: qty 60, 30d supply, fill #1
  Filled 2024-05-16: qty 60, 30d supply, fill #2
  Filled 2024-06-14: qty 60, 30d supply, fill #3

## 2024-03-21 NOTE — Progress Notes (Signed)
   [address], [place] [address]             [phone]     HPI: Patient returns for routine postoperative follow-up having undergone biological Bentall procedure using a 25 mm Edwards KONECT RESILIA pericardial valved conduit with reimplantation of right and left coronary arteries and replacement of the ascending aorta (hemi-arch) using a 30 mm Hemashield graft under deep hypothermic circulatory arrest  on 02/17/2024. The patient's early postoperative recovery while in the hospital was notable for an uncomplicated postoperative course. Since hospital discharge the patient reports that he has been feeling well.  He is walking daily without chest pain or shortness of breath he was [place] the [place] last [place].   Current Outpatient Medications  Medication Sig Dispense Refill   acetaminophen  (TYLENOL ) 500 MG tablet Take 1-2 tablets (500-1,000 mg total) by mouth every 6 (six) hours as needed.     aspirin  EC (ASPIRIN  LOW DOSE) 81 MG tablet Take 1 tablet (81 mg total) by mouth daily. Swallow whole. 30 tablet 3   metoprolol  tartrate (LOPRESSOR ) 25 MG tablet Take 1 tablet (25 mg total) by mouth 2 (two) times daily. 60 tablet 3   Multiple Vitamins-Minerals (MULTIVITAMIN PO) Take 1 tablet by mouth daily.      No current facility-administered medications for this visit.    Physical Exam: BP 121/77 (BP Location: Left Arm)   Pulse 80   Resp 18   Ht 5' 11 (1.803 m)   Wt 188 lb (85.3 kg)   SpO2 100%   BMI 26.22 kg/m  He looks well. Cardiac exam shows regular rate and rhythm with normal heart sounds.  There is no murmur. Lungs are clear. The chest incision is healing well and the sternum is stable. There is no peripheral edema.  Diagnostic Tests:  Narrative & Impression  CLINICAL DATA:  Thoracic aortic aneurysm without rupture.   EXAM: CHEST - 2 VIEW   COMPARISON:  February 20, 2024.   FINDINGS: The heart size and mediastinal contours are within  normal limits. Status post aortic valve repair. No pneumothorax is noted. Both lungs are clear. The visualized skeletal structures are unremarkable.   IMPRESSION: No active cardiopulmonary disease.     Electronically Signed   By: Lynwood Landy Raddle M.D.   On: 03/21/2024 09:46    Impression:  Overall he is doing very well 1 month following his surgery.  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  He is scheduled for a baseline postoperative echo in the near future.  Plan:  I will see him back in 1 year for a CTA of the chest for aortic surveillance.   Dorise [name], MD [facility] [phone]

## 2024-03-22 ENCOUNTER — Telehealth (HOSPITAL_COMMUNITY): Payer: Self-pay

## 2024-03-22 NOTE — Telephone Encounter (Signed)
 Pt insurance is active and benefits verified through [employer]. Co-pay $0, DED $300/$300 met, out of pocket $7,900/$7,900 met, co-insurance 20%. No pre-authorization required. 03/22/2024 @ 11:38am, spoke with Lyta T., REF# [id number].  TCR/ICR? ICR Visit(date of service)limitation? No Can multiple codes be used on the same date of service/visit?(IF ITS A LIMIT) N/A  Is this a lifetime maximum or an annual maximum? Annual Has the member used any of these services to date? No Is there a time limit (weeks/months) on start of program and/or program completion? No

## 2024-03-22 NOTE — Telephone Encounter (Signed)
 Called patient to schedule cardiac rehab, patient is no longer interested as he feels he is exercising enough on his own and he would have a long drive to come to cardiac rehab. Informed patient if he changes his mind we can reopen the referral.  Closing referral.

## 2024-03-23 ENCOUNTER — Telehealth: Admitting: Physician Assistant

## 2024-03-23 DIAGNOSIS — N451 Epididymitis: Secondary | ICD-10-CM | POA: Diagnosis not present

## 2024-03-23 MED ORDER — SULFAMETHOXAZOLE-TRIMETHOPRIM 800-160 MG PO TABS: 1.0000 | ORAL_TABLET | Freq: Two times a day (BID) | ORAL | 0 refills | Status: AC

## 2024-03-23 NOTE — Progress Notes (Signed)
 Virtual Visit Consent   Chase Lowe, you are scheduled for a virtual visit with a [facility] provider today. Just as with appointments in the office, your consent must be obtained to participate. Your consent will be active for this visit and any virtual visit you may have with one of our providers in the next 365 days. If you have a MyChart account, a copy of this consent can be sent to you electronically.  As this is a virtual visit, video technology does not allow for your provider to perform a traditional examination. This may limit your provider's ability to fully assess your condition. If your provider identifies any concerns that need to be evaluated in person or the need to arrange testing (such as labs, EKG, etc.), we will make arrangements to do so. Although advances in technology are sophisticated, we cannot ensure that it will always work on either your end or our end. If the connection with a video visit is poor, the visit may have to be switched to a telephone visit. With either a video or telephone visit, we are not always able to ensure that we have a secure connection.  By engaging in this virtual visit, you consent to the provision of healthcare and authorize for your insurance to be billed (if applicable) for the services provided during this visit. Depending on your insurance coverage, you may receive a charge related to this service.  I need to obtain your verbal consent now. Are you willing to proceed with your visit today? Chase Lowe has provided verbal consent on [birth year] for a virtual visit (video or telephone). Harlene PEDLAR Ward, PA-C  Date: 03/23/2024 6:21 PM   Virtual Visit via Video Note   I, Harlene PEDLAR Ward, connected with  Chase Lowe  ([id number], 1963/09/01) on [birth year] at  6:15 PM EDT by a video-enabled telemedicine application and verified that I am speaking with the correct person using two identifiers.  Location: Patient: Virtual Visit Location  Patient: Home Provider: Virtual Visit Location Provider: Home Office   I discussed the limitations of evaluation and management by telemedicine and the availability of in person appointments. The patient expressed understanding and agreed to proceed.    History of Present Illness: Chase Lowe is a 63 y.o. who identifies as a male who was assigned male at birth, and is being seen today for testicular pain that started about 64 days ago.  Reports pain and a small nodule to the back of his testical with some radiation to the groin.  Denies fever, chills, n/v, abdominal pain, dysuria, testicular swelling or bruising.  Reports he has had epididymitis in the past treated with cipro with resolution.  SABRA  HPI: HPI  Problems:  Patient Active Problem List   Diagnosis Date Noted   S/P AVR (aortic valve replacement) 02/18/2024   S/P ascending aortic replacement 02/18/2024   Aortic stenosis due to bicuspid aortic valve 02/17/2024   Thoracic aortic aneurysm (TAA) (HCC) 12/01/2023   S/P revision of total knee, right 11/18/2022   S/P cervical spinal fusion 06/30/2017    Allergies:  Allergies  Allergen Reactions   Aspirin  Other (See Comments)    High doses contraindicated due to ulcer - Can tolerate in small doses with food   Codeine Nausea And Vomiting   Oxycodone -Acetaminophen  Nausea And Vomiting   Medications:  Current Outpatient Medications:    sulfamethoxazole -trimethoprim  (BACTRIM  DS) 800-160 MG tablet, Take 1 tablet by mouth 2 (two) times daily for 10 days.,  Disp: 20 tablet, Rfl: 0   acetaminophen  (TYLENOL ) 500 MG tablet, Take 1-2 tablets (500-1,000 mg total) by mouth every 6 (six) hours as needed., Disp: , Rfl:    aspirin  EC (ASPIRIN  LOW DOSE) 81 MG tablet, Take 1 tablet (81 mg total) by mouth daily. Swallow whole., Disp: 30 tablet, Rfl: 3   metoprolol  tartrate (LOPRESSOR ) 25 MG tablet, Take 1 tablet (25 mg total) by mouth 2 (two) times daily., Disp: 60 tablet, Rfl: 3   Multiple  Vitamins-Minerals (MULTIVITAMIN PO), Take 1 tablet by mouth daily. , Disp: , Rfl:   Observations/Objective: Patient is well-developed, well-nourished in no acute distress.  Resting comfortably at home.  Head is normocephalic, atraumatic.  No labored breathing.  Speech is clear and coherent with logical content.  Patient is alert and oriented at baseline.    Assessment and Plan: 1. Epididymitis (Primary)  Antibiotic prescribed for presumed epididymitis.  In person evaluation precautions discussed.   Follow Up Instructions: I discussed the assessment and treatment plan with the patient. The patient was provided an opportunity to ask questions and all were answered. The patient agreed with the plan and demonstrated an understanding of the instructions.  A copy of instructions were sent to the patient via MyChart unless otherwise noted below.     The patient was advised to call back or seek an in-person evaluation if the symptoms worsen or if the condition fails to improve as anticipated.    Harlene PEDLAR Ward, PA-C

## 2024-03-30 ENCOUNTER — Ambulatory Visit (HOSPITAL_COMMUNITY)
Admission: RE | Admit: 2024-03-30 | Discharge: 2024-03-30 | Disposition: A | Discharge status: Home / Self Care | Source: Ambulatory Visit | Attending: Internal Medicine | Admitting: Internal Medicine

## 2024-03-30 DIAGNOSIS — Z952 Presence of prosthetic heart valve: Secondary | ICD-10-CM | POA: Diagnosis not present

## 2024-03-30 DIAGNOSIS — I517 Cardiomegaly: Secondary | ICD-10-CM | POA: Diagnosis not present

## 2024-03-30 LAB — ECHOCARDIOGRAM COMPLETE
AV Mean grad: 6.5 mmHg
AV Peak grad: 11.8 mmHg
Ao pk vel: 1.72 m/s
S' Lateral: 2.82 cm

## 2024-03-31 ENCOUNTER — Ambulatory Visit: Payer: Self-pay | Admitting: Internal Medicine

## 2024-04-06 ENCOUNTER — Other Ambulatory Visit: Payer: Self-pay

## 2024-04-06 ENCOUNTER — Other Ambulatory Visit (HOSPITAL_COMMUNITY): Payer: Self-pay

## 2024-04-06 DIAGNOSIS — Z96651 Presence of right artificial knee joint: Secondary | ICD-10-CM | POA: Diagnosis not present

## 2024-04-06 DIAGNOSIS — Z471 Aftercare following joint replacement surgery: Secondary | ICD-10-CM | POA: Diagnosis not present

## 2024-04-06 DIAGNOSIS — Z96652 Presence of left artificial knee joint: Secondary | ICD-10-CM | POA: Diagnosis not present

## 2024-04-06 DIAGNOSIS — Z96653 Presence of artificial knee joint, bilateral: Secondary | ICD-10-CM | POA: Diagnosis not present

## 2024-04-06 DIAGNOSIS — M25562 Pain in left knee: Secondary | ICD-10-CM | POA: Diagnosis not present

## 2024-04-06 MED ORDER — PREDNISONE 5 MG (48) PO TBPK
5.0000 mg | ORAL_TABLET | ORAL | 0 refills | Status: DC
  Filled 2024-04-06: qty 48, 12d supply, fill #0

## 2024-04-16 ENCOUNTER — Other Ambulatory Visit: Payer: Self-pay

## 2024-05-16 ENCOUNTER — Other Ambulatory Visit (HOSPITAL_COMMUNITY): Payer: Self-pay

## 2024-05-16 ENCOUNTER — Other Ambulatory Visit: Payer: Self-pay

## 2024-05-24 ENCOUNTER — Other Ambulatory Visit (HOSPITAL_COMMUNITY): Payer: Self-pay

## 2024-05-24 ENCOUNTER — Telehealth: Admitting: Physician Assistant

## 2024-05-24 DIAGNOSIS — B029 Zoster without complications: Secondary | ICD-10-CM

## 2024-05-24 MED ORDER — VALACYCLOVIR HCL 1 G PO TABS
2000.0000 mg | ORAL_TABLET | Freq: Two times a day (BID) | ORAL | 0 refills | Status: DC
  Filled 2024-05-24: qty 4, 1d supply, fill #0

## 2024-05-24 NOTE — Progress Notes (Signed)
 We are sorry that you are not feeling well.  Here is how we plan to help!  Based on what you have shared with me it does look like you have a viral infection.    Most cold sores or fever blisters are small fluid filled blisters around the mouth caused by herpes simplex virus.  The most common strain of the virus causing cold sores is herpes simplex virus 1.  It can be spread by skin contact, sharing eating utensils, or even sharing towels.  Cold sores are contagious to other people until dry. (Approximately 5-7 days).  Wash your hands. You can spread the virus to your eyes through handling your contact lenses after touching the lesions.  Most people experience pain at the sight or tingling sensations in their lips that may begin before the ulcers erupt.  Herpes simplex is treatable but not curable.  It may lie dormant for a long time and then reappear due to stress or prolonged sun exposure.  Many patients have success in treating their cold sores with an over the counter topical called Abreva.  You may apply the cream up to 5 times daily (maximum 10 days) until healing occurs.  If you would like to use an oral antiviral medication to speed the healing of your cold sore, I have sent a prescription to your local pharmacy Valacyclovir  2 gm take one by mouth twice a day for 1 day    HOME CARE:  Wash your hands frequently. Do not pick at or rub the sore. Don't open the blisters. Avoid kissing other people during this time. Avoid sharing drinking glasses, eating utensils, or razors. Do not handle contact lenses unless you have thoroughly washed your hands with soap and warm water ! Avoid oral sex during this time.  Herpes from sores on your mouth can spread to your partner's genital area. Avoid contact with anyone who has eczema or a weakened immune system. Cold sores are often triggered by exposure to intense sunlight, use a lip balm containing a sunscreen (SPF 30 or higher).  GET HELP RIGHT AWAY  IF:  Blisters look infected. Blisters occur near or in the eye. Symptoms last longer than 10 days. Your symptoms become worse.  MAKE SURE YOU:  Understand these instructions. Will watch your condition. Will get help right away if you are not doing well or get worse.    Your e-visit answers were reviewed by a board certified advanced clinical practitioner to complete your personal care plan.  Depending upon the condition, your plan could have  Included both over the counter or prescription medications.    Please review your pharmacy choice.  Be sure that the pharmacy you have chosen is open so that you can pick up your prescription now.  If there is a problem you can message your provider in MyChart to have the prescription routed to another pharmacy.    Your safety is important to us .  If you have drug allergies check our prescription carefully.  For the next 24 hours you can use MyChart to ask questions about today's visit, request a non-urgent call back, or ask for a work or school excuse from your e-visit provider.  You will get an email in the next two days asking about your experience.  I hope that your e-visit has been valuable and will speed your recovery.   I have spent 5 minutes in review of e-visit questionnaire, review and updating patient chart, medical decision making and response to patient.  Teena Shuck, PA-C

## 2024-05-25 ENCOUNTER — Other Ambulatory Visit: Payer: Self-pay

## 2024-05-28 ENCOUNTER — Other Ambulatory Visit (HOSPITAL_COMMUNITY): Payer: Self-pay

## 2024-05-28 MED ORDER — VALACYCLOVIR HCL 1 G PO TABS
1000.0000 mg | ORAL_TABLET | Freq: Three times a day (TID) | ORAL | 0 refills | Status: AC
  Filled 2024-05-28: qty 21, 7d supply, fill #0

## 2024-05-28 NOTE — Addendum Note (Signed)
 Addended by: VIVIENNE DELON HERO on: 05/28/2024 04:58 PM   Modules accepted: Orders

## 2024-05-29 ENCOUNTER — Other Ambulatory Visit: Payer: Self-pay

## 2024-05-30 ENCOUNTER — Other Ambulatory Visit (HOSPITAL_COMMUNITY): Payer: Self-pay

## 2024-06-11 ENCOUNTER — Other Ambulatory Visit (HOSPITAL_COMMUNITY): Payer: Self-pay

## 2024-06-11 ENCOUNTER — Other Ambulatory Visit: Payer: Self-pay

## 2024-06-13 ENCOUNTER — Telehealth: Admitting: Physician Assistant

## 2024-06-13 ENCOUNTER — Other Ambulatory Visit (HOSPITAL_COMMUNITY): Payer: Self-pay

## 2024-06-13 DIAGNOSIS — B029 Zoster without complications: Secondary | ICD-10-CM

## 2024-06-13 MED ORDER — VALACYCLOVIR HCL 1 G PO TABS
1000.0000 mg | ORAL_TABLET | Freq: Three times a day (TID) | ORAL | 0 refills | Status: AC
  Filled 2024-06-13: qty 21, 7d supply, fill #0

## 2024-06-13 NOTE — Progress Notes (Signed)

## 2024-06-14 ENCOUNTER — Other Ambulatory Visit (HOSPITAL_COMMUNITY): Payer: Self-pay

## 2024-06-18 ENCOUNTER — Other Ambulatory Visit: Payer: Self-pay

## 2024-06-18 ENCOUNTER — Encounter: Payer: Self-pay | Admitting: Emergency Medicine

## 2024-06-18 ENCOUNTER — Other Ambulatory Visit (HOSPITAL_BASED_OUTPATIENT_CLINIC_OR_DEPARTMENT_OTHER): Payer: Self-pay

## 2024-06-18 ENCOUNTER — Ambulatory Visit: Admitting: Emergency Medicine

## 2024-06-18 VITALS — BP 116/78 | HR 68 | Ht 71.0 in | Wt 200.0 lb

## 2024-06-18 DIAGNOSIS — I10 Essential (primary) hypertension: Secondary | ICD-10-CM

## 2024-06-18 DIAGNOSIS — E785 Hyperlipidemia, unspecified: Secondary | ICD-10-CM | POA: Diagnosis not present

## 2024-06-18 DIAGNOSIS — Z95828 Presence of other vascular implants and grafts: Secondary | ICD-10-CM

## 2024-06-18 DIAGNOSIS — Z952 Presence of prosthetic heart valve: Secondary | ICD-10-CM | POA: Diagnosis not present

## 2024-06-18 DIAGNOSIS — I2510 Atherosclerotic heart disease of native coronary artery without angina pectoris: Secondary | ICD-10-CM

## 2024-06-18 MED ORDER — ROSUVASTATIN CALCIUM 5 MG PO TABS
5.0000 mg | ORAL_TABLET | Freq: Every day | ORAL | 3 refills | Status: AC
  Filled 2024-06-18: qty 90, 90d supply, fill #0

## 2024-06-18 MED ORDER — METOPROLOL SUCCINATE ER 25 MG PO TB24
12.5000 mg | ORAL_TABLET | Freq: Every day | ORAL | 3 refills | Status: AC
  Filled 2024-06-18: qty 45, 90d supply, fill #0

## 2024-06-18 MED ORDER — LISINOPRIL 5 MG PO TABS
5.0000 mg | ORAL_TABLET | Freq: Every day | ORAL | 3 refills | Status: AC
  Filled 2024-06-18: qty 90, 90d supply, fill #0

## 2024-06-18 NOTE — Progress Notes (Signed)
 Cardiology Office Note:    Date:  06/18/2024  ID:  Oneil KATHEE Bers, DOB Jun 04, 1961, MRN [id number] PCP: Candise Aleene DEL, MD  [facility] Providers Cardiologist:  Lurena MARLA Red, MD Cardiology APP:  Rana Lum CROME, NP       Patient Profile:       Chief Complaint: 12-month follow-up History of Present Illness:  Chase Lowe is a 63 y.o. male with visit-pertinent history of bicuspid aortic valve with aortic stenosis s/p AVR, ascending aortic aneurysm s/p repair with Bentall procedure, hypertension, hyperlipidemia, CKD stage II   Echocardiogram 11/04/2023 showed bicuspid aortic valve with moderate aortic valve stenosis with mean gradient 25 mmHg, aneurysm of ascending aorta measuring 47 mm, LVEF 55 to 60%, no RWMA, mild LVH, mild mitral valve regurgitation.   Patient established with cardiology service on 12/26/2023 for preoperative cardiovascular examination prior to AVR and Bentall procedure by Dr. Lucas.  Cardiac catheterization 12/2023 showed proximal circumflex and mid circumflex lesion 20% stenosed, mid LAD lesion is 5% stenosed.  Carotid duplex 02/2024 showed bilateral ICA were near normal with only minimal wall thickening or plaque.   On 02/17/2024 he underwent replacement of the ascending aorta and aortic valve with Bentall procedure.  Procedure was uncomplicated and patient maintained NSR and did not require support with pressors/inotropes.  He was last seen in clinic on 03/06/2024.  He was doing well and asymptomatic overall.  He preferred not to start cholesterol-lowering therapy at that time wanted to manage with diet and exercise alone.  No changes were made.  Echocardiogram 03/30/2024 with LVEF 60 to 65%, no RWMA, mild LVH, normal diastolic parameters, RV function and size normal, trivial MR, findings consistent with normal structure and function of the aortic bioprosthesis with no aortic pathic, PVL, or restenosis.   Discussed the use of AI scribe software for  clinical note transcription with the patient, who gave verbal consent to proceed.  History of Present Illness Chase Lowe is a 63 year old male who presents for 65-month follow-up.  Today he is doing well without acute cardiovascular concerns.  Today he reports marked afternoon fatigue and sleepiness, which he attributes to metoprolol . He also notes hair thinning and weight gain since starting it. He briefly cut the dose in half but his blood pressure rose, so he resumed the full twice-daily dose. He previously took lisinopril  5 mg for blood pressure and tolerated it without side effects. On metoprolol  his heart rate is in the upper 60s to 70s.  He is interested in restarting back on lisinopril .  He does monitor his blood pressure daily and denies any hypotension.  He had a bicuspid aortic valve replacement with ascending aorta repair and a normal follow-up echocardiogram in September. He has no chest pain, syncope, or shortness of breath and remains quite physically active with part-time work and various outdoor activities.  He denies dyspnea, orthopnea, PND, LEE, syncope, presyncope, lightheadedness, dizziness, melena, hematochezia.   Review of systems:  Please see the history of present illness. All other systems are reviewed and otherwise negative.      Studies Reviewed:        Echocardiogram 03/30/2024 1. Left ventricular ejection fraction, by estimation, is 60 to 65%. Left  ventricular ejection fraction by 3D volume is 61 %. The left ventricle has  normal function. The left ventricle has no regional wall motion  abnormalities. There is mild concentric  left ventricular hypertrophy. Left ventricular diastolic parameters were  normal. The average left ventricular global  longitudinal strain is -23.8  %. The global longitudinal strain is normal.   2. Right ventricular systolic function is normal. The right ventricular  size is normal.   3. There is no evidence of cardiac  tamponade.   4. The mitral valve is normal in structure. Trivial mitral valve  regurgitation. No evidence of mitral stenosis.   5. Bentall procedure: 25 mm Edwards KONECT RESILIA pericardial valved  conduit with reimplantation or CORS and replacement of the ascending aorta  (hemi-arch) using a 30 mm Hemashield graft. No aortopathy, PVL, or  re-stenosis. The aortic valve has been  repaired/replaced. Aortic valve regurgitation is not visualized. Echo  findings are consistent with normal structure and function of the aortic  valve prosthesis. Aortic valve mean gradient measures 6.5 mmHg.   6. Aortic root/ascending aorta has been repaired/replaced.   Echocardiogram 11/04/2023  1. The aortic valve is abnormal with indeterminant number of cusps.  Unable to determine aortic valve morphology due to image quality. There is  moderate calcification of the aortic valve. Aortic valve regurgitation is  not visualized. Moderate aortic  valve stenosis. Aortic valve area, by VTI measures 0.71 cm. Aortic valve  mean gradient measures 25.0 mmHg. Aortic valve Vmax measures 3.37 m/s.   2. Aortic dilatation noted. Aneurysm of the ascending aorta, measuring 47  mm. There is moderate dilatation of the ascending aorta.   3. Left ventricular ejection fraction, by estimation, is 55 to 60%. Left  ventricular ejection fraction by 3D volume is 57 %. The left ventricle has  normal function. The left ventricle has no regional wall motion  abnormalities. There is mild left  ventricular hypertrophy. Left ventricular diastolic parameters were  normal. The average left ventricular global longitudinal strain is -18.3  %. The global longitudinal strain is normal.   4. Normal right ventricular free wall strain, -28.1%. . Right ventricular  systolic function is normal. The right ventricular size is mildly  enlarged. Tricuspid regurgitation signal is inadequate for assessing PA  pressure.   5. The mitral valve is grossly  normal. Mild mitral valve regurgitation.  No evidence of mitral stenosis.   6. The inferior vena cava is normal in size with greater than 50%  respiratory variability, suggesting right atrial pressure of 3 mmHg.    Cardiac catheterization 12/28/2023   Prox Cx to Mid Cx lesion is 20% stenosed.   Mid LAD lesion is 5% stenosed.   1.  Mild, nonobstructive coronary artery disease. 2.  Fick cardiac output of 7.5 L/min and Fick cardiac index of 3.7 L/min/m with the following hemodynamics:            Right atrial pressure mean of 2 mmHg            Right ventricular pressure 26/-2 with an end-diastolic pressure of 5 mmHg            Wedge pressure mean of 6 mmHg            PA pressure 20/8 with a mean of 14 mmHg            PVR 1.1 Woods units            PA pulsatility index of 6   Recommendation: Continued evaluation for aortic valve replacement and ascending aortic aneurysm repair. Diagnostic Dominance: Right   Risk Assessment/Calculations:              Physical Exam:   VS:  BP 116/78 (BP Location: Right Arm, Patient Position: Sitting, Cuff Size:  Normal)   Pulse 68   Ht 5' 11 (1.803 m)   Wt 200 lb (90.7 kg)   SpO2 99%   BMI 27.89 kg/m    Wt Readings from Last 3 Encounters:  06/18/24 200 lb (90.7 kg)  03/21/24 188 lb (85.3 kg)  03/06/24 185 lb (83.9 kg)    GEN: Well nourished, well developed in no acute distress NECK: No JVD; No carotid bruits CARDIAC: RRR, no murmurs, rubs, gallops RESPIRATORY:  Clear to auscultation without rales, wheezing or rhonchi  ABDOMEN: Soft, non-tender, non-distended EXTREMITIES:  No edema; No acute deformity      Assessment and Plan:  Bicuspid aortic stenosis Ascending aortic aneurysm S/p AVR and Bentall procedure on 02/17/2024 with uncomplicated hospital course Postoperative echo 03/30/2024 showing findings consistent with normal structure and function of the aortic valve prosthesis without aortopathy, PVL, or restenosis with aortic valve mean  gradient measuring 6.5 mmHg - Today patient is stable without chest pains, dyspnea, syncope.  Only experienced very minimal postoperative pain - Has been experiencing fatigue and daytime hypersomnolence on metoprolol .  Was started on metoprolol  during AVR for additional heart rate control.  We trialed briefly a lower dose of metoprolol  tartrate which improved fatigue but but his blood pressure began to rise  - Plan for transition to metoprolol  succinate 12.5 mg daily at nighttime and restart him on his lisinopril  5 mg daily that he was taking prior to AVR for blood pressure support - Continue aspirin  81 mg daily  - He will follow-up with Dr. Lucas in 1 year for CTA of the chest for aortic surveillance   Coronary artery disease Cardiac catheterization 12/2023 showed proximal LCx to mid LCx lesion with 20% stenosis and mid LAD lesion with 5% stenosis - Today patient is stable without anginal symptoms.  He is back to his routine daily activities without limitation and remains asymptomatic.  No indication for further ischemic evaluation at this time - Continue aspirin  81 mg daily - Start rosuvastatin  5 mg daily   Hyperlipidemia, LDL goal <70 LDL 83 on 09/2023  - Plan to start rosuvastatin  5 mg daily - Plans to have follow-up lipid panel and LFTs with PCP on 07/2024    Hypertension Blood pressure today is 116/78 and well-controlled - Noted to have significant fatigue on metoprolol  tartrate with elevated blood pressures on lower dose.  Plan to transition to metoprolol  succinate 12.5 mg daily and restart him back on lisinopril  5 mg daily which he was taking prior to AVR      Dispo:  Return in about 6 months (around 12/17/2024).  Signed, Lum LITTIE Louis, NP

## 2024-06-18 NOTE — Patient Instructions (Addendum)
 Medication Instructions:  STOP TAKING METOPROLOL  TARTRATE. START TAKING METOPROLOL  SUCCINATE 12.5 MG AT NIGHT. START TAKING LISINOPRIL  5 MG DAILY.  START TAKING CRESTOR  5 MG DAILY.  Lab Work: HAVE FOLLOW UP LABS DONE BY PCP  Testing/Procedures: NONE  Follow-Up: At [facility], you and your health needs are our priority.  As part of our continuing mission to provide you with exceptional heart care, our providers are all part of one team.  This team includes your primary Cardiologist (physician) and Advanced Practice Providers or APPs (Physician Assistants and Nurse Practitioners) who all work together to provide you with the care you need, when you need it.  Your next appointment:   6 MONTHS  Provider:   Arun K Thukkani, MD OT Lum Louis, NP

## 2024-06-25 ENCOUNTER — Ambulatory Visit (INDEPENDENT_AMBULATORY_CARE_PROVIDER_SITE_OTHER): Admitting: Family Medicine

## 2024-06-25 ENCOUNTER — Encounter: Payer: Self-pay | Admitting: Family Medicine

## 2024-06-25 VITALS — BP 120/80 | HR 87 | Temp 97.8°F | Wt 204.0 lb

## 2024-06-25 DIAGNOSIS — R051 Acute cough: Secondary | ICD-10-CM

## 2024-06-25 DIAGNOSIS — B349 Viral infection, unspecified: Secondary | ICD-10-CM

## 2024-06-25 LAB — POC COVID19 BINAXNOW: SARS Coronavirus 2 Ag: NEGATIVE

## 2024-06-25 MED ORDER — AZITHROMYCIN 250 MG PO TABS: ORAL_TABLET | ORAL | 0 refills | Status: AC

## 2024-06-25 MED ORDER — BENZONATATE 200 MG PO CAPS: 200.0000 mg | ORAL_CAPSULE | Freq: Three times a day (TID) | ORAL | 0 refills | Status: DC | PRN

## 2024-06-25 NOTE — Patient Instructions (Signed)

## 2024-06-25 NOTE — Progress Notes (Signed)
 "      Chase Lowe , Jun 22, 1961, 63 y.o., male MRN: [id number] Patient Care Team    Relationship Specialty Notifications Start End  McGowen, Aleene DEL, MD PCP - General Family Medicine  12/25/21   Thukkani, Arun K, MD PCP - Cardiology Cardiology  03/06/24   Celestia Agent, MD (Inactive) Consulting Physician Gastroenterology  04/14/22   Rana Lum CROME, NP Nurse Practitioner Cardiology  03/06/24     Chief Complaint  Patient presents with   Cough    Since Friday; cough, congestion, sore throat. Pt had left over Tessalon  script.      Subjective: Chase Lowe is a 63 y.o. Pt presents for an OV with complaints of cough, congestion  of 3 days duration.  Associated symptoms include sore throat. Pt has tried tessalon  to ease their symptoms.  He reports the cough has been the worst part of the illness.  He has not coughed that hard in a very long time.  He had leftover Tessalon  Perles and had been using them and they were helpful. He reports exposure to viral illness. He reports he had open heart surgery just this past August. See ROS     01/13/2024    8:31 AM 09/07/2023    8:04 AM 01/12/2023    8:07 AM 11/11/2022    3:31 PM 07/12/2022    8:04 AM  Depression screen PHQ 2/9  Decreased Interest 0 0 0 0 0  Down, Depressed, Hopeless 0 0 0 0 0  PHQ - 2 Score 0 0 0 0 0    Allergies[1] Social History   Social History Narrative   Married,   Educ: HS   Occup: sales   Past Medical History:  Diagnosis Date   Aortic stenosis    mod/sev on echo 11/2023   Aortic stenosis    Complication of anesthesia    during a colonoscopy pt. woke up during the procedure and also during a hand surgery    Duodenal ulcer 1983   GERD (gastroesophageal reflux disease)    Hay fever    Heart murmur    pt had ECHO 11/04/23   History of adenomatous polyp of colon    2016 (Dr. Celestia, Margarete)   History of blood product transfusion    Hypercholesterolemia    2024, Framingham risk 8%   Hypertension     IFG (impaired fasting glucose)    11/2022 fasting glucose 117.  Hemoglobin A1c 5.1%.   PONV (postoperative nausea and vomiting)    Primary osteoarthritis of knees, bilateral    Shingles    Right lateral hip extending down over right thigh and knee, initial episode 04/2021, recurrence x1 after.  No postherpetic neuralgia.   Thoracic aortic aneurysm    4.7cm may 2025   Thoracic aortic aneurysm    Past Surgical History:  Procedure Laterality Date   ANTERIOR CERVICAL DECOMP/DISCECTOMY FUSION N/A 06/30/2017   Procedure: ANTERIOR CERVICAL DECOMPRESSION/DISCECTOMY FUSION C5-6;  Surgeon: Burnetta Aures, MD;  Location: [facility] OR;  Service: Orthopedics;  Laterality: N/A;  3 hrs   BENTALL PROCEDURE N/A 02/17/2024   Procedure: BENTALL PROCEDURE USING KONECT RESILIA AORTIC VALVED CONDUIT;  Surgeon: Lucas Dorise POUR, MD;  Location: [facility] OR;  Service: Open Heart Surgery;  Laterality: N/A;  CIRC ARREST   COLONOSCOPY  8016,8000   09/02/14 polyps x 3 ([facility])   GANGLION CYST EXCISION Left 2006   HEEL SPUR SURGERY Right 2013   HEEL SPUR SURGERY Right    2nd surgery  INGUINAL HERNIA REPAIR Left 04/26/2014   Procedure: LEFT INGUINAL HERNIA REPAIR WITH MESH;  Surgeon: Vicenta Poli, MD;  Location[facility] OR;  Service: General;  Laterality: Left;   INGUINAL HERNIA REPAIR Right 02/20/2016   Procedure: RIGHT INGUINAL HERNIA REPAIR WITH MESH;  Surgeon: Vicenta Poli, MD;  Location: [facility] OR;  Service: General;  Laterality: Right;   INSERTION OF MESH Left 04/26/2014   Procedure: INSERTION OF MESH;  Surgeon: Vicenta Poli, MD;  Location: [facility] OR;  Service: General;  Laterality: Left;   INSERTION OF MESH Right 02/20/2016   Procedure: INSERTION OF MESH;  Surgeon: Vicenta Poli, MD;  Location: [facility] OR;  Service: General;  Laterality: Right;   INTRAOPERATIVE TRANSESOPHAGEAL ECHOCARDIOGRAM N/A 02/17/2024   Procedure: ECHOCARDIOGRAM, TRANSESOPHAGEAL, INTRAOPERATIVE;  Surgeon: Lucas Dorise POUR, MD;  Location: [facility] OR;  Service:  Open Heart Surgery;  Laterality: N/A;   KNEE ARTHROPLASTY Left 10/03/2023   KNEE ARTHROSCOPY Left 02/2015   left   KNEE ARTHROSCOPY Left 12/2015   KNEE ARTHROSCOPY Right    LASIK Bilateral 2006   REPLACEMENT ASCENDING AORTA N/A 02/17/2024   Procedure: REPLACEMENT, AORTA, ASCENDING USING HEMASHIED VASCULAR GRAFT;  Surgeon: Lucas Dorise POUR, MD;  Location: [facility] OR;  Service: Open Heart Surgery;  Laterality: N/A;   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   RIGHT/LEFT HEART CATH AND CORONARY ANGIOGRAPHY N/A 12/28/2023   Procedure: RIGHT/LEFT HEART CATH AND CORONARY ANGIOGRAPHY;  Surgeon: Wendel Lurena POUR, MD;  [facility] INVAS[facility] LAB;  Service: Cardiovascular;  Laterality: N/A;   TOTAL KNEE REVISION Right 11/18/2022   Procedure: TOTAL KNEE REVISION FEMORAL COMPONENT ONLY;  Surgeon: Ernie Cough, MD;  Location: [facility] ORS;  Service: Orthopedics;  Laterality: Right;   WRIST SURGERY     Family History  Problem Relation Age of Onset   Scoliosis Mother    Healthy Brother    Allergies as of 06/25/2024       Reactions   Aspirin  Other (See Comments)   High doses contraindicated due to ulcer - Can tolerate in small doses with food   Codeine Nausea And Vomiting   Oxycodone -acetaminophen  Nausea And Vomiting        Medication List        Accurate as of June 25, 2024  1:50 PM. If you have any questions, ask your nurse or doctor.          acetaminophen  500 MG tablet Commonly known as: TYLENOL  Take 1-2 tablets (500-1,000 mg total) by mouth every 6 (six) hours as needed.   Aspirin  Low Dose 81 MG tablet Generic drug: aspirin  EC Take 1 tablet (81 mg total) by mouth daily. Swallow whole.   azithromycin  250 MG tablet Commonly known as: ZITHROMAX  Take 2 tablets on day 1, then 1 tablet daily on days 2 through 5 Started by: Charlies Bellini, DO   benzonatate  200 MG capsule Commonly known as: TESSALON  Take 1 capsule (200 mg total) by mouth 3 (three) times daily as needed for  cough. Started by: Charlies Bellini, DO   lisinopril  5 MG tablet Commonly known as: ZESTRIL  Take 1 tablet (5 mg total) by mouth daily.   metoprolol  succinate 25 MG 24 hr tablet Commonly known as: TOPROL -XL Take 0.5 tablets (12.5 mg total) by mouth daily.   MULTIVITAMIN PO Take 1 tablet by mouth daily.   rosuvastatin  5 MG tablet Commonly known as: Crestor  Take 1 tablet (5 mg total) by mouth daily.        All past medical history, surgical history, allergies, family  history, immunizations andmedications were updated in the EMR today and reviewed under the history and medication portions of their EMR.     Review of Systems  Constitutional:  Negative for chills, fever and malaise/fatigue.  HENT:  Positive for congestion and sore throat. Negative for ear pain and sinus pain.   Eyes: Negative.   Respiratory:  Positive for cough. Negative for sputum production, shortness of breath and wheezing.   Cardiovascular: Negative.   Gastrointestinal: Negative.  Negative for abdominal pain, nausea and vomiting.  Genitourinary: Negative.   Musculoskeletal: Negative.   Skin:  Negative for rash.  Neurological:  Negative for dizziness and headaches.   Negative, with the exception of above mentioned in HPI   Objective:  BP 120/80   Pulse 87   Temp 97.8 F (36.6 C)   Wt 204 lb (92.5 kg)   SpO2 97%   BMI 28.45 kg/m  Body mass index is 28.45 kg/m. Physical Exam Vitals and nursing note reviewed. Exam conducted with a chaperone present.  Constitutional:      General: He is not in acute distress.    Appearance: Normal appearance. He is not ill-appearing, toxic-appearing or diaphoretic.  HENT:     Head: Normocephalic and atraumatic.     Right Ear: Tympanic membrane, ear canal and external ear normal.     Left Ear: Tympanic membrane, ear canal and external ear normal.     Nose: Congestion and rhinorrhea (Mild) present.     Mouth/Throat:     Mouth: Mucous membranes are moist.     Pharynx:  Posterior oropharyngeal erythema (Mild) present. No oropharyngeal exudate.  Eyes:     General: No scleral icterus.       Right eye: No discharge.        Left eye: No discharge.     Extraocular Movements: Extraocular movements intact.     Pupils: Pupils are equal, round, and reactive to light.  Cardiovascular:     Rate and Rhythm: Normal rate and regular rhythm.     Heart sounds: Murmur heard.  Pulmonary:     Effort: Pulmonary effort is normal. No respiratory distress.     Breath sounds: Normal breath sounds. No wheezing, rhonchi or rales.  Musculoskeletal:     Cervical back: Neck supple. Tenderness present.  Lymphadenopathy:     Cervical: Cervical adenopathy present.  Skin:    General: Skin is warm and dry.     Coloration: Skin is not jaundiced or pale.     Findings: No rash.  Neurological:     Mental Status: He is alert and oriented to person, place, and time. Mental status is at baseline.  Psychiatric:        Mood and Affect: Mood normal.        Behavior: Behavior normal.        Thought Content: Thought content normal.        Judgment: Judgment normal.     No results found. No results found. Results for orders placed or performed in visit on 06/25/24 (from the past 24 hours)  POC COVID-19 BinaxNow     Status: Normal   Collection Time: 06/25/24  1:42 PM  Result Value Ref Range   SARS Coronavirus 2 Ag Negative Negative    Assessment/Plan: Chase Lowe is a 63 y.o. male present for OV for  Acute cough/viral respiratory illness - POC COVID-19 BinaxNow-negative Office is out of influenza tests Rest.  Hydrate. He has seen some improvement over the last 24 hours  and his symptoms Can use Mucinex DM product avoiding decongestants Refilled Tessalon  Perles for him-allergic to codeine Provided Z-Pak prescription for him to start only if symptoms worsen over the next 2 days, or do not improve.  Reviewed expectations re: course of current medical issues. Discussed  self-management of symptoms. Outlined signs and symptoms indicating need for more acute intervention. Patient verbalized understanding and all questions were answered. Patient received an After-Visit Summary.    Orders Placed This Encounter  Procedures   POC COVID-19 BinaxNow   Meds ordered this encounter  Medications   azithromycin  (ZITHROMAX ) 250 MG tablet    Sig: Take 2 tablets on day 1, then 1 tablet daily on days 2 through 5    Dispense:  6 tablet    Refill:  0   benzonatate  (TESSALON ) 200 MG capsule    Sig: Take 1 capsule (200 mg total) by mouth 3 (three) times daily as needed for cough.    Dispense:  20 capsule    Refill:  0   Referral Orders  No referral(s) requested today     Note is dictated utilizing voice recognition software. Although note has been proof read prior to signing, occasional typographical errors still can be missed. If any questions arise, please do not hesitate to call for verification.   electronically signed by:  Charlies Bellini, DO  [facility] - OR       [1]  Allergies Allergen Reactions   Aspirin  Other (See Comments)    High doses contraindicated due to ulcer - Can tolerate in small doses with food   Codeine Nausea And Vomiting   Oxycodone -Acetaminophen  Nausea And Vomiting   "

## 2024-06-26 ENCOUNTER — Telehealth: Payer: Self-pay | Admitting: *Deleted

## 2024-06-26 NOTE — Telephone Encounter (Signed)
 Spoke to patient to clarify how he should be taking the Metoprolol  Succinate. Per [facility] he should take Metoprolol  Succinate 12.5 mg (1/2 tablet) twice daily until his cold is gone then go back to taking Metoprolol  Succinate 12.5 (1/2 tablet) once daily. He understood recommendation and had no questions.

## 2024-07-09 ENCOUNTER — Other Ambulatory Visit: Payer: Self-pay

## 2024-07-09 ENCOUNTER — Other Ambulatory Visit (HOSPITAL_COMMUNITY): Payer: Self-pay

## 2024-07-09 ENCOUNTER — Telehealth: Admitting: Physician Assistant

## 2024-07-09 DIAGNOSIS — B029 Zoster without complications: Secondary | ICD-10-CM

## 2024-07-09 MED ORDER — VALACYCLOVIR HCL 1 G PO TABS
1000.0000 mg | ORAL_TABLET | Freq: Three times a day (TID) | ORAL | 0 refills | Status: AC
  Filled 2024-07-09: qty 21, 7d supply, fill #0

## 2024-07-09 NOTE — Progress Notes (Signed)
 E-visit for Shingles   We are sorry that you are not feeling well. Here is how we plan to help!  Based on what you shared with me it looks like you have shingles.  Shingles or herpes zoster, is a common infection of the nerves.  It is a painful rash caused by the herpes zoster virus.  This is the same virus that causes chickenpox.  After a person has chickenpox, the virus remains inactive in the nerve cells.  Years later, the virus can become active again and travel to the skin.  It typically will appear on one side of the face or body.  Burning or shooting pain, tingling, or itching are early signs of the infection.  Blisters typically scab over in 7 to 10 days and clear up within 2-4 weeks. Shingles is only contagious to people that have never had the chickenpox, the chickenpox vaccine, or anyone who has a compromised immune system.  You should avoid contact with these type of people until your blisters scab over.  I have prescribed Valacyclovir  1g three times daily for 7 days  If this continues to recur, please seek in person evaluation to make sure you are not immunocompromised that could be causing outbreaks. Also, sometimes Shingles can become disseminated, meaning occurring outside normal occurrences. This form of shingles is much harder to treat outpatient and sometimes requires IV treatment with another antiviral called, Acyclovir.    HOME CARE: Apply ice packs (wrapped in a thin towel), cool compresses, or soak in cool bath to help reduce pain. Use calamine lotion to calm itchy skin. Avoid scratching the rash. Avoid direct sunlight.  GET HELP RIGHT AWAY IF: Symptoms that dont away after treatment. A rash or blisters near your eye. Increased drainage, fever, or rash after treatment. Severe pain that doesnt go away.   MAKE SURE YOU   Understand these instructions. Will watch your condition. Will get help right away if you are not doing well or get worse.  Thank you for  choosing an e-visit. Your e-visit answers were reviewed by a board certified advanced clinical practitioner to complete your personal care plan. Depending upon the condition, your plan could have included both over the counter or prescription medications.  Please review your pharmacy choice. Make sure the pharmacy is open so you can pick up prescription now. If there is a problem, you may contact your provider through [employer] and have the prescription routed to another pharmacy.  Your safety is important to us . If you have drug allergies check your prescription carefully.   For the next 24 hours you can use MyChart to ask questions about todays visit, request a non-urgent call back, or ask for a work or school excuse.  You will get an email in the next two days asking about your experience. I hope that your e-visit has been valuable and will speed your recovery  I have spent 5 minutes in review of e-visit questionnaire, review and updating patient chart, medical decision making and response to patient.   Delon CHRISTELLA Dickinson, PA-C

## 2024-07-10 ENCOUNTER — Other Ambulatory Visit (HOSPITAL_COMMUNITY): Payer: Self-pay

## 2024-07-16 ENCOUNTER — Encounter: Payer: Self-pay | Admitting: Family Medicine

## 2024-07-16 ENCOUNTER — Ambulatory Visit: Admitting: Family Medicine

## 2024-07-16 VITALS — BP 138/80 | HR 71 | Temp 98.2°F | Wt 200.0 lb

## 2024-07-16 DIAGNOSIS — Z860101 Personal history of adenomatous and serrated colon polyps: Secondary | ICD-10-CM

## 2024-07-16 DIAGNOSIS — D649 Anemia, unspecified: Secondary | ICD-10-CM

## 2024-07-16 DIAGNOSIS — D696 Thrombocytopenia, unspecified: Secondary | ICD-10-CM

## 2024-07-16 DIAGNOSIS — I10 Essential (primary) hypertension: Secondary | ICD-10-CM

## 2024-07-16 DIAGNOSIS — I2510 Atherosclerotic heart disease of native coronary artery without angina pectoris: Secondary | ICD-10-CM | POA: Diagnosis not present

## 2024-07-16 LAB — BASIC METABOLIC PANEL WITH GFR
BUN: 16 mg/dL (ref 6–23)
CO2: 27 meq/L (ref 19–32)
Calcium: 9.5 mg/dL (ref 8.4–10.5)
Chloride: 103 meq/L (ref 96–112)
Creatinine, Ser: 1.14 mg/dL (ref 0.40–1.50)
GFR: 68.01 mL/min
Glucose, Bld: 88 mg/dL (ref 70–99)
Potassium: 4.6 meq/L (ref 3.5–5.1)
Sodium: 137 meq/L (ref 135–145)

## 2024-07-16 LAB — CBC WITH DIFFERENTIAL/PLATELET
Basophils Absolute: 0 K/uL (ref 0.0–0.1)
Basophils Relative: 0.6 % (ref 0.0–3.0)
Eosinophils Absolute: 0.2 K/uL (ref 0.0–0.7)
Eosinophils Relative: 3 % (ref 0.0–5.0)
HCT: 40.6 % (ref 39.0–52.0)
Hemoglobin: 13.7 g/dL (ref 13.0–17.0)
Lymphocytes Relative: 49.5 % — ABNORMAL HIGH (ref 12.0–46.0)
Lymphs Abs: 2.9 K/uL (ref 0.7–4.0)
MCHC: 33.8 g/dL (ref 30.0–36.0)
MCV: 87.9 fl (ref 78.0–100.0)
Monocytes Absolute: 0.5 K/uL (ref 0.1–1.0)
Monocytes Relative: 8 % (ref 3.0–12.0)
Neutro Abs: 2.3 K/uL (ref 1.4–7.7)
Neutrophils Relative %: 38.9 % — ABNORMAL LOW (ref 43.0–77.0)
Platelets: 129 K/uL — ABNORMAL LOW (ref 150.0–400.0)
RBC: 4.62 Mil/uL (ref 4.22–5.81)
RDW: 16.8 % — ABNORMAL HIGH (ref 11.5–15.5)
WBC: 5.9 K/uL (ref 4.0–10.5)

## 2024-07-16 NOTE — Progress Notes (Signed)
 OFFICE VISIT  07/16/2024  CC:  Chief Complaint  Patient presents with   Hypertension    Patient is a 64 y.o. male who presents for 46-month follow-up hypertension. At last visit blood pressure was well-controlled on one half of a 5 mg lisinopril  tab daily.  INTERIM HX: He got Bentall procedure for aortic stenosis and ascending aortic aneurysm on 02/17/2024. He did very well with recovery.  No pain.  He has basically unlimited in his ability to exercise. Cardiology put him on 12.5 mg of Toprol -XL.  They also started him on rosuvastatin  5 mg a day.  He took the rosuvastatin  for a couple of days and felt severe body aches.  However, he notes that right after that he started to get a flulike illness as well.  He has not restarted the rosuvastatin  at this time.  He does regular home blood pressure monitoring and it is always normal.  ROS as above, plus--> no fevers, no CP, no SOB, no wheezing, no cough, no dizziness, no HAs, no rashes, no melena/hematochezia.  No polyuria or polydipsia.  No myalgias or arthralgias.  No focal weakness, paresthesias, or tremors.  No acute vision or hearing abnormalities.  No dysuria or unusual/new urinary urgency or frequency.  No recent changes in lower legs. No n/v/d or abd pain.  No palpitations.    Past Medical History:  Diagnosis Date   Aortic stenosis    mod/sev on echo 11/2023   Aortic stenosis    Complication of anesthesia    during a colonoscopy pt. woke up during the procedure and also during a hand surgery    Duodenal ulcer 1983   GERD (gastroesophageal reflux disease)    Hay fever    Heart murmur    pt had ECHO 11/04/23   History of adenomatous polyp of colon    2016 (Dr. Celestia, Margarete)   History of blood product transfusion    Hypercholesterolemia    2024, Framingham risk 8%   Hypertension    IFG (impaired fasting glucose)    11/2022 fasting glucose 117.  Hemoglobin A1c 5.1%.   PONV (postoperative nausea and vomiting)    Primary  osteoarthritis of knees, bilateral    Shingles    Right lateral hip extending down over right thigh and knee, initial episode 04/2021, recurrence x1 after.  No postherpetic neuralgia.   Thoracic aortic aneurysm    4.7cm may 2025   Thoracic aortic aneurysm     Past Surgical History:  Procedure Laterality Date   ANTERIOR CERVICAL DECOMP/DISCECTOMY FUSION N/A 06/30/2017   Procedure: ANTERIOR CERVICAL DECOMPRESSION/DISCECTOMY FUSION C5-6;  Surgeon: Burnetta Aures, MD;  Location: [facility] OR;  Service: Orthopedics;  Laterality: N/A;  3 hrs   BENTALL PROCEDURE N/A 02/17/2024   Procedure: BENTALL PROCEDURE USING KONECT RESILIA AORTIC VALVED CONDUIT;  Surgeon: Lucas Dorise POUR, MD;  Location: [facility] OR;  Service: Open Heart Surgery;  Laterality: N/A;  CIRC ARREST   COLONOSCOPY  8016,8000   09/02/14 polyps x 3 ([facility])   GANGLION CYST EXCISION Left 2006   HEEL SPUR SURGERY Right 2013   HEEL SPUR SURGERY Right    2nd surgery   INGUINAL HERNIA REPAIR Left 04/26/2014   Procedure: LEFT INGUINAL HERNIA REPAIR WITH MESH;  Surgeon: Vicenta Poli, MD;  Location: [facility] OR;  Service: General;  Laterality: Left;   INGUINAL HERNIA REPAIR Right 02/20/2016   Procedure: RIGHT INGUINAL HERNIA REPAIR WITH MESH;  Surgeon: Vicenta Poli, MD;  Location: [facility] OR;  Service: General;  Laterality:  Right;   INSERTION OF MESH Left 04/26/2014   Procedure: INSERTION OF MESH;  Surgeon: Vicenta Poli, MD;  Location: [facility] OR;  Service: General;  Laterality: Left;   INSERTION OF MESH Right 02/20/2016   Procedure: INSERTION OF MESH;  Surgeon: Vicenta Poli, MD;  Location: [facility] OR;  Service: General;  Laterality: Right;   INTRAOPERATIVE TRANSESOPHAGEAL ECHOCARDIOGRAM N/A 02/17/2024   Procedure: ECHOCARDIOGRAM, TRANSESOPHAGEAL, INTRAOPERATIVE;  Surgeon: Lucas Dorise POUR, MD;  Location: [facility] OR;  Service: Open Heart Surgery;  Laterality: N/A;   KNEE ARTHROPLASTY Left 10/03/2023   KNEE ARTHROSCOPY Left 02/2015   left   KNEE ARTHROSCOPY  Left 12/2015   KNEE ARTHROSCOPY Right    LASIK Bilateral 2006   REPLACEMENT ASCENDING AORTA N/A 02/17/2024   Procedure: REPLACEMENT, AORTA, ASCENDING USING HEMASHIED VASCULAR GRAFT;  Surgeon: Lucas Dorise POUR, MD;  Location: [facility] OR;  Service: Open Heart Surgery;  Laterality: N/A;   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   RIGHT/LEFT HEART CATH AND CORONARY ANGIOGRAPHY N/A 12/28/2023   Procedure: RIGHT/LEFT HEART CATH AND CORONARY ANGIOGRAPHY;  Surgeon: Wendel Lurena POUR, MD;  [facility];  Service: Cardiovascular;  Laterality: N/A;   TOTAL KNEE REVISION Right 11/18/2022   Procedure: TOTAL KNEE REVISION FEMORAL COMPONENT ONLY;  Surgeon: Ernie Cough, MD;  Location: [facility] ORS;  Service: Orthopedics;  Laterality: Right;   WRIST SURGERY      Outpatient Medications Prior to Visit  Medication Sig Dispense Refill   acetaminophen  (TYLENOL ) 500 MG tablet Take 1-2 tablets (500-1,000 mg total) by mouth every 6 (six) hours as needed.     aspirin  EC (ASPIRIN  LOW DOSE) 81 MG tablet Take 1 tablet (81 mg total) by mouth daily. Swallow whole. 30 tablet 3   lisinopril  (ZESTRIL ) 5 MG tablet Take 1 tablet (5 mg total) by mouth daily. (Patient taking differently: Take 2.5 mg by mouth daily.) 90 tablet 3   metoprolol  succinate (TOPROL -XL) 25 MG 24 hr tablet Take 0.5 tablets (12.5 mg total) by mouth daily. 45 tablet 3   Multiple Vitamins-Minerals (MULTIVITAMIN PO) Take 1 tablet by mouth daily.      rosuvastatin  (CRESTOR ) 5 MG tablet Take 1 tablet (5 mg total) by mouth daily. (Patient not taking: Reported on 07/16/2024) 90 tablet 3   valACYclovir  (VALTREX ) 1000 MG tablet Take 1 tablet (1,000 mg total) by mouth 3 (three) times daily for 7 days. (Patient not taking: Reported on 07/16/2024) 21 tablet 0   benzonatate  (TESSALON ) 200 MG capsule Take 1 capsule (200 mg total) by mouth 3 (three) times daily as needed for cough. 20 capsule 0   No facility-administered medications prior to visit.     Allergies[1]  Review of Systems As per HPI  PE:    07/16/2024    8:54 AM 06/25/2024    1:27 PM 06/18/2024    8:51 AM  Vitals with BMI  Height   5' 11  Weight 200 lbs 204 lbs 200 lbs  BMI  28.46 27.91  Systolic 138 120 883  Diastolic 80 80 78  Pulse 71 87 68     Physical Exam  Gen: Alert, well appearing.  Patient is oriented to person, place, time, and situation. AFFECT: pleasant, lucid thought and speech. CV: Regular rhythm and rate, 1/6 systolic murmur, crisp S2.  No diastolic murmur. Chest is clear, no wheezing or rales. Normal symmetric air entry throughout both lung fields. No chest wall deformities or tenderness. EXT: no edema   LABS:  Last CBC Lab Results  Component Value Date   WBC 7.8 02/20/2024   HGB 9.5 (L) 02/20/2024   HCT 28.6 (L) 02/20/2024   MCV 91.4 02/20/2024   MCH 30.4 02/20/2024   RDW 13.2 02/20/2024   PLT 89 (L) 02/20/2024   Last metabolic panel Lab Results  Component Value Date   GLUCOSE 89 03/06/2024   NA 139 03/06/2024   K 4.8 03/06/2024   CL 102 03/06/2024   CO2 19 (L) 03/06/2024   BUN 10 03/06/2024   CREATININE 1.01 03/06/2024   EGFR 83 03/06/2024   CALCIUM  9.6 03/06/2024   PROT 7.3 02/14/2024   ALBUMIN  4.3 02/14/2024   BILITOT 0.7 02/14/2024   ALKPHOS 89 02/14/2024   AST 22 02/14/2024   ALT 21 02/14/2024   ANIONGAP 4 (L) 02/21/2024   Last lipids Lab Results  Component Value Date   CHOL 145 09/07/2023   HDL 43.90 09/07/2023   LDLCALC 83 09/07/2023   TRIG 91.0 09/07/2023   CHOLHDL 3 09/07/2023   Last hemoglobin A1c Lab Results  Component Value Date   HGBA1C 5.5 02/14/2024   IMPRESSION AND PLAN:  #1 hypertension, well-controlled with one half of a 5 mg lisinopril  tab daily.  #2 mild nonobstructive coronary artery disease. He is on aspirin  81 mg daily. He will retry Crestor  5 mg a day because were not sure if this medication was the cause of his myalgias (viral syndrome at essentially the same time).  #3  history of aortic stenosis and thoracic aortic aneurysm. He did well with Bentall procedure surgery several months ago. He had anemia and thrombocytopenia in postop setting.   Check CBC with differential today.  An After Visit Summary was printed and given to the patient.  FOLLOW UP: Return in about 6 months (around 01/13/2025) for annual CPE (fasting). Next CPE July 2026 Signed:  Gerlene Hockey, MD           07/16/2024     [1]  Allergies Allergen Reactions   Aspirin  Other (See Comments)    High doses contraindicated due to ulcer - Can tolerate in small doses with food   Codeine Nausea And Vomiting   Oxycodone -Acetaminophen  Nausea And Vomiting

## 2024-07-17 ENCOUNTER — Ambulatory Visit: Payer: Self-pay | Admitting: Family Medicine

## 2025-01-14 ENCOUNTER — Encounter: Admitting: Family Medicine
